# Patient Record
Sex: Female | Born: 1950 | Race: White | Hispanic: No | Smoking: Never smoker
Health system: Southern US, Community
[De-identification: ages and names within clinical notes are randomized; demographics above are authoritative.]

## PROBLEM LIST (undated history)

## (undated) DIAGNOSIS — T7840XA Allergy, unspecified, initial encounter: Secondary | ICD-10-CM

## (undated) DIAGNOSIS — R7303 Prediabetes: Secondary | ICD-10-CM

## (undated) DIAGNOSIS — C4490 Unspecified malignant neoplasm of skin, unspecified: Secondary | ICD-10-CM

## (undated) DIAGNOSIS — L309 Dermatitis, unspecified: Secondary | ICD-10-CM

## (undated) DIAGNOSIS — C50919 Malignant neoplasm of unspecified site of unspecified female breast: Secondary | ICD-10-CM

## (undated) DIAGNOSIS — I10 Essential (primary) hypertension: Secondary | ICD-10-CM

## (undated) DIAGNOSIS — E785 Hyperlipidemia, unspecified: Secondary | ICD-10-CM

## (undated) DIAGNOSIS — E669 Obesity, unspecified: Secondary | ICD-10-CM

## (undated) DIAGNOSIS — M5430 Sciatica, unspecified side: Secondary | ICD-10-CM

## (undated) HISTORY — DX: Unspecified malignant neoplasm of skin, unspecified: C44.90

## (undated) HISTORY — DX: Malignant neoplasm of unspecified site of unspecified female breast: C50.919

## (undated) HISTORY — PX: TUBAL LIGATION: SHX77

## (undated) HISTORY — DX: Dermatitis, unspecified: L30.9

## (undated) HISTORY — DX: Obesity, unspecified: E66.9

## (undated) HISTORY — DX: Sciatica, unspecified side: M54.30

## (undated) HISTORY — PX: CHOLECYSTECTOMY: SHX55

## (undated) HISTORY — DX: Hyperlipidemia, unspecified: E78.5

## (undated) HISTORY — DX: Prediabetes: R73.03

## (undated) HISTORY — DX: Allergy, unspecified, initial encounter: T78.40XA

## (undated) HISTORY — DX: Essential (primary) hypertension: I10

## (undated) HISTORY — PX: TONSILLECTOMY: SUR1361

---

## 1998-01-09 ENCOUNTER — Encounter: Admission: RE | Admit: 1998-01-09 | Discharge: 1998-01-09 | Payer: Self-pay | Admitting: *Deleted

## 2001-10-29 ENCOUNTER — Other Ambulatory Visit: Admission: RE | Admit: 2001-10-29 | Discharge: 2001-10-29 | Payer: Self-pay | Admitting: Obstetrics and Gynecology

## 2002-03-07 ENCOUNTER — Ambulatory Visit (HOSPITAL_COMMUNITY): Admission: RE | Admit: 2002-03-07 | Discharge: 2002-03-07 | Payer: Self-pay | Admitting: Gastroenterology

## 2002-12-13 ENCOUNTER — Encounter: Admission: RE | Admit: 2002-12-13 | Discharge: 2002-12-13 | Payer: Self-pay | Admitting: Obstetrics and Gynecology

## 2002-12-13 ENCOUNTER — Encounter: Payer: Self-pay | Admitting: Obstetrics and Gynecology

## 2003-01-05 ENCOUNTER — Other Ambulatory Visit: Admission: RE | Admit: 2003-01-05 | Discharge: 2003-01-05 | Payer: Self-pay | Admitting: Obstetrics and Gynecology

## 2003-12-18 ENCOUNTER — Encounter: Admission: RE | Admit: 2003-12-18 | Discharge: 2003-12-18 | Payer: Self-pay | Admitting: Obstetrics and Gynecology

## 2004-01-10 ENCOUNTER — Other Ambulatory Visit: Admission: RE | Admit: 2004-01-10 | Discharge: 2004-01-10 | Payer: Self-pay | Admitting: Obstetrics and Gynecology

## 2005-08-29 ENCOUNTER — Encounter: Admission: RE | Admit: 2005-08-29 | Discharge: 2005-08-29 | Payer: Self-pay | Admitting: Obstetrics and Gynecology

## 2006-05-25 ENCOUNTER — Emergency Department (HOSPITAL_COMMUNITY): Admission: EM | Admit: 2006-05-25 | Discharge: 2006-05-25 | Payer: Self-pay | Admitting: Family Medicine

## 2006-10-01 ENCOUNTER — Encounter: Admission: RE | Admit: 2006-10-01 | Discharge: 2006-10-01 | Payer: Self-pay | Admitting: Obstetrics and Gynecology

## 2007-12-14 ENCOUNTER — Encounter: Admission: RE | Admit: 2007-12-14 | Discharge: 2007-12-14 | Payer: Self-pay | Admitting: Obstetrics and Gynecology

## 2008-10-11 ENCOUNTER — Emergency Department (HOSPITAL_COMMUNITY): Admission: EM | Admit: 2008-10-11 | Discharge: 2008-10-11 | Payer: Self-pay | Admitting: Family Medicine

## 2008-12-26 ENCOUNTER — Encounter: Admission: RE | Admit: 2008-12-26 | Discharge: 2008-12-26 | Payer: Self-pay | Admitting: Obstetrics and Gynecology

## 2009-03-23 ENCOUNTER — Other Ambulatory Visit: Admission: RE | Admit: 2009-03-23 | Discharge: 2009-03-23 | Payer: Self-pay | Admitting: Internal Medicine

## 2009-03-23 ENCOUNTER — Ambulatory Visit: Payer: Self-pay | Admitting: Internal Medicine

## 2009-03-28 ENCOUNTER — Encounter: Admission: RE | Admit: 2009-03-28 | Discharge: 2009-03-28 | Payer: Self-pay | Admitting: Internal Medicine

## 2009-06-28 ENCOUNTER — Ambulatory Visit: Payer: Self-pay | Admitting: Internal Medicine

## 2009-07-16 ENCOUNTER — Ambulatory Visit: Payer: Self-pay | Admitting: Internal Medicine

## 2009-09-28 ENCOUNTER — Encounter: Admission: RE | Admit: 2009-09-28 | Discharge: 2009-09-28 | Payer: Self-pay | Admitting: Internal Medicine

## 2009-10-11 ENCOUNTER — Ambulatory Visit: Payer: Self-pay | Admitting: Internal Medicine

## 2009-12-14 ENCOUNTER — Emergency Department (HOSPITAL_COMMUNITY): Admission: EM | Admit: 2009-12-14 | Discharge: 2009-12-14 | Payer: Self-pay | Admitting: Emergency Medicine

## 2009-12-17 ENCOUNTER — Emergency Department (HOSPITAL_COMMUNITY): Admission: EM | Admit: 2009-12-17 | Discharge: 2009-12-17 | Payer: Self-pay | Admitting: Emergency Medicine

## 2010-01-08 ENCOUNTER — Encounter: Admission: RE | Admit: 2010-01-08 | Discharge: 2010-01-08 | Payer: Self-pay | Admitting: Internal Medicine

## 2010-01-17 ENCOUNTER — Ambulatory Visit: Payer: Self-pay | Admitting: Internal Medicine

## 2010-05-27 ENCOUNTER — Ambulatory Visit: Payer: Self-pay | Admitting: Internal Medicine

## 2010-08-19 ENCOUNTER — Ambulatory Visit
Admission: RE | Admit: 2010-08-19 | Discharge: 2010-08-19 | Payer: Self-pay | Source: Home / Self Care | Attending: Internal Medicine | Admitting: Internal Medicine

## 2010-12-27 NOTE — Procedures (Signed)
[facility]  Patient:    AVIGAYIL, TON Visit Number: [id number] MRN: [id number]          Service Type: Attending:  Anselmo Rod, M.D. Dictated by:   Anselmo Rod, M.D. Proc. Date: 03/07/02   CC:         Silverio Lay, M.D.   Procedure Report  DATE OF BIRTH:  July 23, 1951  REFERRING PHYSICIAN:  Silverio Lay, M.D.  PROCEDURE PERFORMED:  Colonoscopy.  ENDOSCOPIST:  Anselmo Rod, M.D.  INSTRUMENT USED:  Olympus video colonoscope.  INDICATIONS FOR PROCEDURE:  The patient is a 60 year old white female with a history of rectal bleeding and family history of breast cancer in her mother. Rule out colonic polyps, masses, etc.  PREPROCEDURE PREPARATION:  Informed consent was procured from the patient. The patient was fasted for eight hours prior to the procedure and prepped with a bottle of magnesium citrate and a gallon of NuLytely the night prior to the procedure.  PREPROCEDURE PHYSICAL:  The patient had stable vital signs.  Neck supple. Chest clear to auscultation.  S1, S2 regular.  Abdomen soft with normal bowel sounds.  DESCRIPTION OF PROCEDURE:  The patient was placed in the left lateral decubitus position and sedated with 50 mg of Demerol and 5 mg of Versed intravenously.  Once the patient was adequately sedated and maintained on low-flow oxygen and continuous cardiac monitoring, the Olympus video colonoscope was advanced from the rectum to the cecum without difficulty. The entire colonic mucosa appeared healthy with normal vascular pattern.  The terminal ileum appeared normal as well.  The appendicular orifice and the ileocecal valve were clearly visualized and photographed.  Small nonbleeding internal hemorrhoids were seen on retroflexion in the rectum.  The patient tolerated the procedure well without complications.  IMPRESSION:  Normal colonoscopy up to the terminal ileum except for small nonbleeding internal  hemorrhoids.  RECOMMENDATIONS: 1. A high fiber diet with liberal fluid intake has been advocated. 2. Outpatient follow-up in the next two weeks or earlier if rectal bleeding    recurs. 3. Repeat colorectal cancer screening in the next five years unless    the patient has any abnormal symptoms in the interim.Dictated by:   Anselmo Rod, M.D. Attending:  Anselmo Rod, M.D. DD:  03/07/02 TD:  03/08/02 Job: [id number]

## 2011-01-21 ENCOUNTER — Other Ambulatory Visit: Payer: 59 | Admitting: Internal Medicine

## 2011-01-21 DIAGNOSIS — E785 Hyperlipidemia, unspecified: Secondary | ICD-10-CM

## 2011-01-21 DIAGNOSIS — E119 Type 2 diabetes mellitus without complications: Secondary | ICD-10-CM

## 2011-01-21 LAB — HEMOGLOBIN A1C
Hgb A1c MFr Bld: 5.9 % — ABNORMAL HIGH (ref <5.7)
Mean Plasma Glucose: 123 mg/dL — ABNORMAL HIGH (ref <117)

## 2011-01-21 LAB — LIPID PANEL: Total CHOL/HDL Ratio: 4 Ratio

## 2011-01-23 ENCOUNTER — Ambulatory Visit (INDEPENDENT_AMBULATORY_CARE_PROVIDER_SITE_OTHER): Payer: 59 | Admitting: Internal Medicine

## 2011-01-23 ENCOUNTER — Encounter: Payer: Self-pay | Admitting: Internal Medicine

## 2011-01-23 DIAGNOSIS — E669 Obesity, unspecified: Secondary | ICD-10-CM

## 2011-01-23 DIAGNOSIS — E559 Vitamin D deficiency, unspecified: Secondary | ICD-10-CM

## 2011-01-23 DIAGNOSIS — E785 Hyperlipidemia, unspecified: Secondary | ICD-10-CM

## 2011-01-23 DIAGNOSIS — R7303 Prediabetes: Secondary | ICD-10-CM

## 2011-01-23 DIAGNOSIS — I10 Essential (primary) hypertension: Secondary | ICD-10-CM

## 2011-01-23 DIAGNOSIS — R7309 Other abnormal glucose: Secondary | ICD-10-CM

## 2011-01-24 ENCOUNTER — Encounter: Payer: Self-pay | Admitting: Internal Medicine

## 2011-01-24 DIAGNOSIS — E785 Hyperlipidemia, unspecified: Secondary | ICD-10-CM | POA: Insufficient documentation

## 2011-01-24 DIAGNOSIS — I10 Essential (primary) hypertension: Secondary | ICD-10-CM | POA: Insufficient documentation

## 2011-01-24 DIAGNOSIS — E559 Vitamin D deficiency, unspecified: Secondary | ICD-10-CM | POA: Insufficient documentation

## 2011-01-24 NOTE — Patient Instructions (Signed)
Restart Crestor. Diet, exercise and lose weight. Recheck lipid panel in 3-6 months with liver functions.

## 2011-01-24 NOTE — Progress Notes (Signed)
  Subjective:    Patient ID: Terri Hawkins, female    DOB: 1951-05-07, 60 y.o.   MRN: [id number]  HPI Unfortunately, pt quit taking Crestor and recent lab work certainly reflects that. The reason for noncompliance is that "she hates to take meds and is annoyed with herself for needing them". Reviewed fasting lipid panel with her which is clearly abnormal and was normal on Crestor 10 mg daily at last visit. She also has hx of Vitamin D deficiency and prediabetes. Hgb AIC is stable on diet alone. Needs to get serious about diet, exercise and weight los. Discussion about this today.    Review of Systems     Objective:   Physical Exam Neck:supple without bruits; Chest clear: Cor:RRR normal S1/S2 Ext: without edema        Assessment & Plan:  Hyperlipidemia- restart Crestor 10 mg daily-    recheck lipid panel and liver functions in 3-6 months.( C/o Zocor causes leg pain.) HTN stable on HCTZ and Norvasc Prediabetes-diet controlled.  AIC was 6% in Jan 2012. Obesity- pt needs to be serious about diet,exercise and weight loss.

## 2011-02-17 ENCOUNTER — Other Ambulatory Visit: Payer: Self-pay | Admitting: Internal Medicine

## 2011-02-17 DIAGNOSIS — Z1231 Encounter for screening mammogram for malignant neoplasm of breast: Secondary | ICD-10-CM

## 2011-02-25 ENCOUNTER — Ambulatory Visit
Admission: RE | Admit: 2011-02-25 | Discharge: 2011-02-25 | Disposition: A | Discharge status: Home / Self Care | Payer: 59 | Source: Ambulatory Visit | Attending: Internal Medicine | Admitting: Internal Medicine

## 2011-02-25 DIAGNOSIS — Z1231 Encounter for screening mammogram for malignant neoplasm of breast: Secondary | ICD-10-CM

## 2011-05-30 ENCOUNTER — Encounter: Payer: 59 | Admitting: Internal Medicine

## 2011-07-10 ENCOUNTER — Other Ambulatory Visit: Payer: Self-pay | Admitting: Internal Medicine

## 2011-07-28 ENCOUNTER — Other Ambulatory Visit: Payer: 59 | Admitting: Internal Medicine

## 2011-07-28 DIAGNOSIS — Z0000 Encounter for general adult medical examination without abnormal findings: Secondary | ICD-10-CM

## 2011-07-28 DIAGNOSIS — Z131 Encounter for screening for diabetes mellitus: Secondary | ICD-10-CM

## 2011-07-28 DIAGNOSIS — I10 Essential (primary) hypertension: Secondary | ICD-10-CM

## 2011-07-28 LAB — COMPREHENSIVE METABOLIC PANEL
ALT: 26 U/L (ref 0–35)
AST: 23 U/L (ref 0–37)
Albumin: 4.5 g/dL (ref 3.5–5.2)
Alkaline Phosphatase: 94 U/L (ref 39–117)
Glucose, Bld: 92 mg/dL (ref 70–99)
Potassium: 4.6 mEq/L (ref 3.5–5.3)
Sodium: 139 mEq/L (ref 135–145)
Total Protein: 7.2 g/dL (ref 6.0–8.3)

## 2011-07-28 LAB — CBC WITH DIFFERENTIAL/PLATELET
Basophils Absolute: 0 10*3/uL (ref 0.0–0.1)
Basophils Relative: 0 % (ref 0–1)
Hemoglobin: 13.6 g/dL (ref 12.0–15.0)
MCHC: 33.3 g/dL (ref 30.0–36.0)
Monocytes Relative: 7 % (ref 3–12)
Neutro Abs: 3.7 10*3/uL (ref 1.7–7.7)
Neutrophils Relative %: 49 % (ref 43–77)

## 2011-07-28 LAB — TSH: TSH: 2.362 u[IU]/mL (ref 0.350–4.500)

## 2011-07-28 LAB — LIPID PANEL: LDL Cholesterol: 117 mg/dL — ABNORMAL HIGH (ref 0–99)

## 2011-07-29 ENCOUNTER — Ambulatory Visit (INDEPENDENT_AMBULATORY_CARE_PROVIDER_SITE_OTHER): Payer: 59 | Admitting: Internal Medicine

## 2011-07-29 ENCOUNTER — Encounter: Payer: Self-pay | Admitting: Internal Medicine

## 2011-07-29 VITALS — BP 148/86 | HR 80 | Temp 98.5°F | Ht 63.5 in | Wt 189.5 lb

## 2011-07-29 DIAGNOSIS — R7302 Impaired glucose tolerance (oral): Secondary | ICD-10-CM

## 2011-07-29 DIAGNOSIS — E785 Hyperlipidemia, unspecified: Secondary | ICD-10-CM

## 2011-07-29 DIAGNOSIS — Z0000 Encounter for general adult medical examination without abnormal findings: Secondary | ICD-10-CM

## 2011-07-29 DIAGNOSIS — I10 Essential (primary) hypertension: Secondary | ICD-10-CM

## 2011-07-29 LAB — VITAMIN D 25 HYDROXY (VIT D DEFICIENCY, FRACTURES): Vit D, 25-Hydroxy: 44 ng/mL (ref 30–89)

## 2011-07-29 LAB — HEMOGLOBIN A1C
Hgb A1c MFr Bld: 6 % — ABNORMAL HIGH (ref <5.7)
Mean Plasma Glucose: 126 mg/dL — ABNORMAL HIGH (ref <117)

## 2011-08-11 ENCOUNTER — Encounter: Payer: Self-pay | Admitting: Internal Medicine

## 2011-08-11 NOTE — Patient Instructions (Addendum)
Continue same medication and return in 6 months. 

## 2011-08-15 ENCOUNTER — Other Ambulatory Visit: Payer: Self-pay | Admitting: Internal Medicine

## 2011-10-13 DIAGNOSIS — R7302 Impaired glucose tolerance (oral): Secondary | ICD-10-CM | POA: Insufficient documentation

## 2011-10-13 NOTE — Progress Notes (Signed)
  Subjective:    Patient ID: Terri Hawkins, female    DOB: 1951-07-04, 61 y.o.   MRN: [id number]  HPI 61 year old white female with hypertension hyperlipidemia vitamin D deficiency and diabetes mellitus in today for health maintenance exam and evaluation of medical problems. Patient says Zocor causes leg pain. She tried Crestor but could not tolerate that either. Currently on Norvasc 5 mg daily for hypertension.  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  History of 2 C-sections 1972 and 1982. Cholecystectomy 1988.  Tetanus immunization at [facility] 2009.  Takes when necessary Valtrex.  Father died at age 13 with history of diabetes the cause of death was brain tumor. Mother died at age 61 breast cancer. 2 brothers in good health. One adult son and adult daughter in good health. No sisters.  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 Has a college education. Is divorced. Does not smoke. Social alcohol consumption. Lives alone. Daughter lives in [place]. Patient likes to raise orchids and chickens.  Had diabetic eye exam 03/12/2011 by Dr. Blima Ledger which was normal.    Review of Systems  Constitutional: Negative.   HENT: Negative.   Eyes: Negative.   Cardiovascular: Negative.   Gastrointestinal: Negative.   Genitourinary: Negative.   Musculoskeletal: Negative.   Neurological: Negative.   Hematological: Negative.   Psychiatric/Behavioral: Negative.        Objective:   Physical Exam  Vitals reviewed. Constitutional: She is oriented to person, place, and time. She appears well-developed and well-nourished. No distress.  HENT:  Head: Normocephalic and atraumatic.  Right Ear: External ear normal.  Left Ear: External ear normal.  Mouth/Throat: Oropharynx is clear and moist.  Eyes: Conjunctivae and EOM are normal. Pupils are equal, round, and reactive to light. Right eye exhibits no discharge. Left eye exhibits no discharge. No scleral icterus.    Neck: Neck supple. No JVD present. No thyromegaly present.  Cardiovascular: Normal rate, regular rhythm, normal heart sounds and intact distal pulses.   No murmur heard. Pulmonary/Chest: Effort normal and breath sounds normal.       Breasts normal female  Abdominal: Soft. Bowel sounds are normal. There is no tenderness. There is no rebound and no guarding.  Genitourinary:       Last Pap smear 03/23/2009. Bimanual exam normal.  Lymphadenopathy:    She has no cervical adenopathy.  Neurological: She is alert and oriented to person, place, and time. She has normal reflexes. She displays normal reflexes. No cranial nerve deficit. Coordination normal.  Skin: Skin is warm and dry. No rash noted. She is not diaphoretic.  Psychiatric: She has a normal mood and affect. Her behavior is normal.          Assessment & Plan:  Hypertension  Hyperlipidemia  Vitamin D deficiency  Diabetes mellitus  Plan: Continue same medication for hypertension. Return in 6-12 months or as needed. Unable to tolerate statin medication. We'll have to control hyperlipidemia with diet.

## 2012-01-27 ENCOUNTER — Other Ambulatory Visit: Payer: 59 | Admitting: Internal Medicine

## 2012-01-27 DIAGNOSIS — E785 Hyperlipidemia, unspecified: Secondary | ICD-10-CM

## 2012-01-27 DIAGNOSIS — Z79899 Other long term (current) drug therapy: Secondary | ICD-10-CM

## 2012-01-27 DIAGNOSIS — R7301 Impaired fasting glucose: Secondary | ICD-10-CM

## 2012-01-27 LAB — HEPATIC FUNCTION PANEL
AST: 21 U/L (ref 0–37)
Alkaline Phosphatase: 85 U/L (ref 39–117)
Bilirubin, Direct: 0.1 mg/dL (ref 0.0–0.3)
Total Bilirubin: 0.7 mg/dL (ref 0.3–1.2)

## 2012-01-27 LAB — LIPID PANEL: LDL Cholesterol: 109 mg/dL — ABNORMAL HIGH (ref 0–99)

## 2012-02-02 ENCOUNTER — Encounter: Payer: Self-pay | Admitting: Internal Medicine

## 2012-02-02 ENCOUNTER — Ambulatory Visit (INDEPENDENT_AMBULATORY_CARE_PROVIDER_SITE_OTHER): Payer: 59 | Admitting: Internal Medicine

## 2012-02-02 VITALS — BP 136/84 | HR 72 | Temp 98.4°F | Ht 63.5 in | Wt 193.0 lb

## 2012-02-02 DIAGNOSIS — E785 Hyperlipidemia, unspecified: Secondary | ICD-10-CM

## 2012-02-02 DIAGNOSIS — I10 Essential (primary) hypertension: Secondary | ICD-10-CM

## 2012-02-02 DIAGNOSIS — Z8639 Personal history of other endocrine, nutritional and metabolic disease: Secondary | ICD-10-CM

## 2012-02-02 DIAGNOSIS — Z87898 Personal history of other specified conditions: Secondary | ICD-10-CM

## 2012-02-02 DIAGNOSIS — R7302 Impaired glucose tolerance (oral): Secondary | ICD-10-CM

## 2012-02-02 DIAGNOSIS — R7309 Other abnormal glucose: Secondary | ICD-10-CM

## 2012-02-08 NOTE — Progress Notes (Signed)
  Subjective:    Patient ID: Terri Hawkins, female    DOB: Jan 21, 1951, 61 y.o.   MRN: [id number]  HPI 61 year old white female in today for sixty-one-month followup on essential hypertension, hyperlipidemia, impaired glucose tolerance and history of vitamin D deficiency. No complaints or problems. Patient says she has had a diabetic eye exam within the past 61 months with Dr. Blima Ledger. She is on amlodipine, Crestor, vitamin D supplement, calcium, Valtrex when necessary  2 C-sections 1979 in 1982. Cholecystectomy 1988. No known drug allergies. Patient stopped taking Zocor in 2010, she concerned about myalgias. Which actually had was radiculopathy. She had an MRI of LS-spine and 2011 showing mild spinal stenosis. Was able to get her to try Crestor instead. This is been fine. Vitamin D level was normal in December 2012. LDL cholesterol was 117 at that time and previously had months previously. TSH was normal.    Review of Systems     Objective:   Physical Exam skin is warm and dry; HEENT exam TMs and pharynx are clear; neck is supple without thyromegaly JVD or carotid bruits; chest clear to auscultation; cardiac exam regular rate and rhythm normal S1 and S2; extremities without edema. Diabetic foot exam is negative. Pulses are normal and no altered in feet.        Assessment & Plan:  Impaired glucose tolerance  History of vitamin D deficiency  Hypertension  Hyperlipidemia  History of herpes simplex infection  Plan: Refilled Valtrex 500 mg twice daily when necessary 1 year refill. Return in 6 months for physical examination. Continue same medications for hyperlipidemia and hypertension.

## 2012-02-08 NOTE — Patient Instructions (Addendum)
Watch diet, exercise, lose some weight. Return in 6 months for physical examination.

## 2012-04-02 ENCOUNTER — Other Ambulatory Visit: Payer: Self-pay | Admitting: Internal Medicine

## 2012-04-02 DIAGNOSIS — Z1231 Encounter for screening mammogram for malignant neoplasm of breast: Secondary | ICD-10-CM

## 2012-04-20 ENCOUNTER — Ambulatory Visit
Admission: RE | Admit: 2012-04-20 | Discharge: 2012-04-20 | Disposition: A | Discharge status: Home / Self Care | Payer: 59 | Source: Ambulatory Visit | Attending: Internal Medicine | Admitting: Internal Medicine

## 2012-04-20 DIAGNOSIS — Z1231 Encounter for screening mammogram for malignant neoplasm of breast: Secondary | ICD-10-CM

## 2012-05-12 ENCOUNTER — Other Ambulatory Visit: Payer: Self-pay

## 2012-05-12 MED ORDER — AMLODIPINE BESYLATE 5 MG PO TABS: 5.0000 mg | ORAL_TABLET | Freq: Every day | ORAL | Status: DC

## 2012-08-02 ENCOUNTER — Other Ambulatory Visit: Payer: 59 | Admitting: Internal Medicine

## 2012-08-03 ENCOUNTER — Encounter: Payer: 59 | Admitting: Internal Medicine

## 2012-08-16 ENCOUNTER — Other Ambulatory Visit: Payer: 59 | Admitting: Internal Medicine

## 2012-08-16 DIAGNOSIS — E785 Hyperlipidemia, unspecified: Secondary | ICD-10-CM

## 2012-08-16 DIAGNOSIS — E559 Vitamin D deficiency, unspecified: Secondary | ICD-10-CM

## 2012-08-16 DIAGNOSIS — R7301 Impaired fasting glucose: Secondary | ICD-10-CM

## 2012-08-16 DIAGNOSIS — I10 Essential (primary) hypertension: Secondary | ICD-10-CM

## 2012-08-16 LAB — COMPREHENSIVE METABOLIC PANEL
ALT: 22 U/L (ref 0–35)
Albumin: 4.8 g/dL (ref 3.5–5.2)
CO2: 26 mEq/L (ref 19–32)
Calcium: 10 mg/dL (ref 8.4–10.5)
Chloride: 105 mEq/L (ref 96–112)
Glucose, Bld: 102 mg/dL — ABNORMAL HIGH (ref 70–99)
Sodium: 141 mEq/L (ref 135–145)
Total Bilirubin: 0.6 mg/dL (ref 0.3–1.2)
Total Protein: 7.5 g/dL (ref 6.0–8.3)

## 2012-08-16 LAB — LIPID PANEL
Cholesterol: 199 mg/dL (ref 0–200)
Triglycerides: 102 mg/dL (ref <150)
VLDL: 20 mg/dL (ref 0–40)

## 2012-08-16 LAB — CBC WITH DIFFERENTIAL/PLATELET
Eosinophils Absolute: 0.1 10*3/uL (ref 0.0–0.7)
Eosinophils Relative: 1 % (ref 0–5)
Hemoglobin: 13.5 g/dL (ref 12.0–15.0)
Lymphocytes Relative: 27 % (ref 12–46)
Lymphs Abs: 2.6 10*3/uL (ref 0.7–4.0)
MCH: 29.3 pg (ref 26.0–34.0)
MCV: 87.2 fL (ref 78.0–100.0)
Monocytes Relative: 5 % (ref 3–12)
Neutrophils Relative %: 67 % (ref 43–77)
RBC: 4.6 MIL/uL (ref 3.87–5.11)
WBC: 9.8 10*3/uL (ref 4.0–10.5)

## 2012-08-16 LAB — HEMOGLOBIN A1C
Hgb A1c MFr Bld: 6 % — ABNORMAL HIGH (ref <5.7)
Mean Plasma Glucose: 126 mg/dL — ABNORMAL HIGH (ref <117)

## 2012-08-17 ENCOUNTER — Other Ambulatory Visit (HOSPITAL_COMMUNITY)
Admission: RE | Admit: 2012-08-17 | Discharge: 2012-08-17 | Disposition: A | Discharge status: Home / Self Care | Payer: 59 | Source: Ambulatory Visit | Attending: Internal Medicine | Admitting: Internal Medicine

## 2012-08-17 ENCOUNTER — Ambulatory Visit (INDEPENDENT_AMBULATORY_CARE_PROVIDER_SITE_OTHER): Payer: 59 | Admitting: Internal Medicine

## 2012-08-17 ENCOUNTER — Encounter: Payer: Self-pay | Admitting: Internal Medicine

## 2012-08-17 VITALS — BP 130/72 | HR 76 | Temp 98.6°F | Ht 63.5 in | Wt 193.0 lb

## 2012-08-17 DIAGNOSIS — I10 Essential (primary) hypertension: Secondary | ICD-10-CM

## 2012-08-17 DIAGNOSIS — Z124 Encounter for screening for malignant neoplasm of cervix: Secondary | ICD-10-CM

## 2012-08-17 DIAGNOSIS — R7302 Impaired glucose tolerance (oral): Secondary | ICD-10-CM

## 2012-08-17 DIAGNOSIS — E785 Hyperlipidemia, unspecified: Secondary | ICD-10-CM

## 2012-08-17 DIAGNOSIS — R7309 Other abnormal glucose: Secondary | ICD-10-CM

## 2012-08-17 DIAGNOSIS — Z01419 Encounter for gynecological examination (general) (routine) without abnormal findings: Secondary | ICD-10-CM | POA: Insufficient documentation

## 2012-08-17 LAB — VITAMIN D 25 HYDROXY (VIT D DEFICIENCY, FRACTURES): Vit D, 25-Hydroxy: 35 ng/mL (ref 30–89)

## 2012-08-17 LAB — POCT URINALYSIS DIPSTICK: Glucose, UA: NEGATIVE

## 2012-08-17 NOTE — Patient Instructions (Addendum)
Continue same medications. You have been given samples of Crestor 10 mg daily because of expense of Crestor on your insurance plan. Return in 6 months for office visit lipid panel liver functions and hemoglobin A1c. Try to diet  & exercise.

## 2012-09-13 ENCOUNTER — Encounter: Payer: Self-pay | Admitting: Internal Medicine

## 2012-09-13 NOTE — Progress Notes (Signed)
  Subjective:    Patient ID: Terri Hawkins, female    DOB: May 18, 1951, 62 y.o.   MRN: [id number]  HPI 62 year old white female in today for health maintenance and evaluation of medical problems. History of essential hypertension, hyperlipidemia, impaired glucose tolerance and history of vitamin D deficiency. Patient tried Zocor but indicated it caused leg pain.   Past medical history: Left knee squamous cell carcinoma July 2010, history of 2 C-sections 1972 and 1982. Cholecystectomy 1988.  Tetanus immunization at [facility] 2009.  Family history: Father died at age 29 with history of diabetes but cause of death was brain tumor. Mother died at age 21 breast cancer. 2 brothers in good health. No sisters  Social history: Patient has one adult son and one adult daughter in good health. 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 Is divorced. Does not smoke. Social alcohol consumption. She resides alone. Daughter lives in [place]. Patient likes to raise orchids and chickens.  Has annual eye exam by Dr. Blima Ledger    Review of Systems  Constitutional: Negative.   HENT: Negative.   Eyes: Negative.   Respiratory: Negative.   Cardiovascular: Negative.   Gastrointestinal: Negative.   Genitourinary: Negative.   Musculoskeletal: Negative.   Neurological: Negative.   Hematological: Negative.   Psychiatric/Behavioral: Negative.        Objective:   Physical Exam  Vitals reviewed. Constitutional: She is oriented to person, place, and time. She appears well-developed and well-nourished. No distress.  HENT:  Head: Normocephalic and atraumatic.  Right Ear: External ear normal.  Left Ear: External ear normal.  Mouth/Throat: Oropharynx is clear and moist.  Eyes: Conjunctivae normal and EOM are normal. Pupils are equal, round, and reactive to light. Right eye exhibits no discharge. Left eye exhibits no discharge. No scleral icterus.  Neck: Neck supple. No JVD  present. No tracheal deviation present. No thyromegaly present.  Cardiovascular: Normal rate, regular rhythm, normal heart sounds and intact distal pulses.  Exam reveals no friction rub.   No murmur heard. Pulmonary/Chest: Effort normal and breath sounds normal. No respiratory distress. She has no wheezes. She has no rales. She exhibits no tenderness.       Breasts normal female  Abdominal: Soft. Bowel sounds are normal. She exhibits no distension and no mass. There is no tenderness. There is no rebound and no guarding.  Genitourinary: Vagina normal and uterus normal.       Pap taken  Musculoskeletal: Normal range of motion. She exhibits no edema.  Lymphadenopathy:    She has no cervical adenopathy.  Neurological: She is alert and oriented to person, place, and time. She has normal reflexes. She displays normal reflexes. No cranial nerve deficit. Coordination normal.  Skin: Skin is warm and dry. No rash noted. She is not diaphoretic.  Psychiatric: She has a normal mood and affect. Her behavior is normal. Judgment and thought content normal.          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

## 2013-02-17 ENCOUNTER — Other Ambulatory Visit: Payer: 59 | Admitting: Internal Medicine

## 2013-02-17 DIAGNOSIS — Z79899 Other long term (current) drug therapy: Secondary | ICD-10-CM

## 2013-02-17 DIAGNOSIS — E785 Hyperlipidemia, unspecified: Secondary | ICD-10-CM

## 2013-02-17 DIAGNOSIS — R7301 Impaired fasting glucose: Secondary | ICD-10-CM

## 2013-02-17 LAB — LIPID PANEL
HDL: 61 mg/dL (ref >39)
LDL Cholesterol: 91 mg/dL (ref 0–99)
Total CHOL/HDL Ratio: 2.9 Ratio

## 2013-02-17 LAB — HEMOGLOBIN A1C: Hgb A1c MFr Bld: 5.7 % — ABNORMAL HIGH (ref <5.7)

## 2013-02-17 LAB — HEPATIC FUNCTION PANEL
AST: 20 U/L (ref 0–37)
Albumin: 4.5 g/dL (ref 3.5–5.2)
Total Bilirubin: 0.5 mg/dL (ref 0.3–1.2)

## 2013-02-18 ENCOUNTER — Ambulatory Visit (INDEPENDENT_AMBULATORY_CARE_PROVIDER_SITE_OTHER): Payer: 59 | Admitting: Internal Medicine

## 2013-02-18 ENCOUNTER — Encounter: Payer: Self-pay | Admitting: Internal Medicine

## 2013-02-18 VITALS — BP 124/72 | HR 72 | Wt 192.0 lb

## 2013-02-18 DIAGNOSIS — R7309 Other abnormal glucose: Secondary | ICD-10-CM

## 2013-02-18 DIAGNOSIS — I10 Essential (primary) hypertension: Secondary | ICD-10-CM

## 2013-02-18 DIAGNOSIS — R7302 Impaired glucose tolerance (oral): Secondary | ICD-10-CM

## 2013-02-18 DIAGNOSIS — E785 Hyperlipidemia, unspecified: Secondary | ICD-10-CM

## 2013-02-18 MED ORDER — CRESTOR 10 MG PO TABS: ORAL_TABLET | ORAL | Status: DC

## 2013-02-18 MED ORDER — AMLODIPINE BESYLATE 5 MG PO TABS: 5.0000 mg | ORAL_TABLET | Freq: Every day | ORAL | Status: DC

## 2013-02-18 NOTE — Patient Instructions (Addendum)
Continue same meds and return in 6 months 

## 2013-02-18 NOTE — Progress Notes (Signed)
  Subjective:    Patient ID: Terri Hawkins, female    DOB: April 10, 1951, 62 y.o.   MRN: [id number]  HPI  62 year old white female with history of hypertension, impaired glucose tolerance, hyperlipidemia treated with Crestor in today for six-month recheck. No complaints or problems. Fasting labs show total cholesterol and LDL cholesterol to be within normal limits. Triglycerides are normal as well. Hemoglobin A1c is 5.7% and previously was 6% January 2014.    Review of Systems noncontributory     Objective:   Physical Exam skin warm and dry. Nodes none. Neck is supple without JVD thyromegaly or carotid bruits. Chest is clear to auscultation. Cardiac exam regular rate and rhythm normal S1 and S2. Extremities without edema. Diabetic foot exam without calluses or ulcers        Assessment & Plan:  Hypertension-stable on current regimen of amlodipine  Hyperlipidemia-stable and within normal limits on Crestor. Liver functions are normal as well  Impaired glucose tolerance-stable on diet alone. Hemoglobin A1c is 5.7% previously was 6% January 2014  Plan: Return in 6 months for physical exam. Samples of Crestor given. Refill amlodipine for one year.  Patient should have annual diabetic eye exam. Sees Dr. Blima Ledger.  Time spent with patient 25 minutes

## 2013-04-05 ENCOUNTER — Other Ambulatory Visit: Payer: Self-pay

## 2013-04-05 DIAGNOSIS — Z1231 Encounter for screening mammogram for malignant neoplasm of breast: Secondary | ICD-10-CM

## 2013-04-22 ENCOUNTER — Ambulatory Visit
Admission: RE | Admit: 2013-04-22 | Discharge: 2013-04-22 | Disposition: A | Discharge status: Home / Self Care | Payer: 59 | Source: Ambulatory Visit

## 2013-04-22 DIAGNOSIS — Z1231 Encounter for screening mammogram for malignant neoplasm of breast: Secondary | ICD-10-CM

## 2013-08-18 ENCOUNTER — Other Ambulatory Visit: Payer: Managed Care, Other (non HMO) | Admitting: Internal Medicine

## 2013-08-18 DIAGNOSIS — Z1329 Encounter for screening for other suspected endocrine disorder: Secondary | ICD-10-CM

## 2013-08-18 DIAGNOSIS — Z130 Encounter for screening for diseases of the blood and blood-forming organs and certain disorders involving the immune mechanism: Secondary | ICD-10-CM

## 2013-08-18 DIAGNOSIS — I10 Essential (primary) hypertension: Secondary | ICD-10-CM

## 2013-08-18 DIAGNOSIS — E785 Hyperlipidemia, unspecified: Secondary | ICD-10-CM

## 2013-08-18 DIAGNOSIS — Z79899 Other long term (current) drug therapy: Secondary | ICD-10-CM

## 2013-08-18 DIAGNOSIS — R7301 Impaired fasting glucose: Secondary | ICD-10-CM

## 2013-08-18 DIAGNOSIS — E559 Vitamin D deficiency, unspecified: Secondary | ICD-10-CM

## 2013-08-18 LAB — COMPREHENSIVE METABOLIC PANEL
ALBUMIN: 4.6 g/dL (ref 3.5–5.2)
ALT: 23 U/L (ref 0–35)
AST: 19 U/L (ref 0–37)
Alkaline Phosphatase: 85 U/L (ref 39–117)
BUN: 17 mg/dL (ref 6–23)
CALCIUM: 9.9 mg/dL (ref 8.4–10.5)
CHLORIDE: 99 meq/L (ref 96–112)
CO2: 30 mEq/L (ref 19–32)
CREATININE: 0.75 mg/dL (ref 0.50–1.10)
GLUCOSE: 89 mg/dL (ref 70–99)
POTASSIUM: 4.5 meq/L (ref 3.5–5.3)
Sodium: 136 mEq/L (ref 135–145)
Total Bilirubin: 0.7 mg/dL (ref 0.3–1.2)
Total Protein: 7.2 g/dL (ref 6.0–8.3)

## 2013-08-18 LAB — CBC WITH DIFFERENTIAL/PLATELET
BASOS PCT: 0 % (ref 0–1)
Basophils Absolute: 0 10*3/uL (ref 0.0–0.1)
EOS PCT: 2 % (ref 0–5)
Eosinophils Absolute: 0.2 10*3/uL (ref 0.0–0.7)
HEMATOCRIT: 41.5 % (ref 36.0–46.0)
HEMOGLOBIN: 13.8 g/dL (ref 12.0–15.0)
LYMPHS PCT: 37 % (ref 12–46)
Lymphs Abs: 3.3 10*3/uL (ref 0.7–4.0)
MCH: 29.3 pg (ref 26.0–34.0)
MCHC: 33.3 g/dL (ref 30.0–36.0)
MCV: 88.1 fL (ref 78.0–100.0)
MONO ABS: 0.7 10*3/uL (ref 0.1–1.0)
MONOS PCT: 8 % (ref 3–12)
NEUTROS ABS: 4.6 10*3/uL (ref 1.7–7.7)
Neutrophils Relative %: 53 % (ref 43–77)
Platelets: 312 10*3/uL (ref 150–400)
RBC: 4.71 MIL/uL (ref 3.87–5.11)
RDW: 13.4 % (ref 11.5–15.5)
WBC: 8.8 10*3/uL (ref 4.0–10.5)

## 2013-08-18 LAB — LIPID PANEL
CHOL/HDL RATIO: 2.6 ratio
CHOLESTEROL: 157 mg/dL (ref 0–200)
HDL: 60 mg/dL (ref >39)
LDL Cholesterol: 79 mg/dL (ref 0–99)
TRIGLYCERIDES: 92 mg/dL (ref <150)
VLDL: 18 mg/dL (ref 0–40)

## 2013-08-18 LAB — HEMOGLOBIN A1C
Hgb A1c MFr Bld: 6.1 % — ABNORMAL HIGH (ref <5.7)
Mean Plasma Glucose: 128 mg/dL — ABNORMAL HIGH (ref <117)

## 2013-08-18 MED ORDER — FLUOCINONIDE-E 0.05 % EX CREA: 1.0000 "application " | TOPICAL_CREAM | Freq: Two times a day (BID) | CUTANEOUS | Status: DC

## 2013-08-18 NOTE — Addendum Note (Signed)
Addended by: Brett Canales on: 08/18/2013 10:36 AM   Modules accepted: Orders

## 2013-08-19 ENCOUNTER — Encounter: Payer: 59 | Admitting: Internal Medicine

## 2013-08-19 LAB — VITAMIN D 25 HYDROXY (VIT D DEFICIENCY, FRACTURES): Vit D, 25-Hydroxy: 51 ng/mL (ref 30–89)

## 2013-08-19 LAB — TSH: TSH: 2.489 u[IU]/mL (ref 0.350–4.500)

## 2014-06-16 ENCOUNTER — Emergency Department (HOSPITAL_COMMUNITY)
Admission: EM | Admit: 2014-06-16 | Discharge: 2014-06-16 | Disposition: A | Discharge status: Home / Self Care | Payer: Managed Care, Other (non HMO) | Attending: Emergency Medicine | Admitting: Emergency Medicine

## 2014-06-16 ENCOUNTER — Encounter (HOSPITAL_COMMUNITY): Payer: Self-pay | Admitting: Emergency Medicine

## 2014-06-16 DIAGNOSIS — T63441A Toxic effect of venom of bees, accidental (unintentional), initial encounter: Secondary | ICD-10-CM

## 2014-06-16 DIAGNOSIS — Y9289 Other specified places as the place of occurrence of the external cause: Secondary | ICD-10-CM | POA: Insufficient documentation

## 2014-06-16 DIAGNOSIS — Y9389 Activity, other specified: Secondary | ICD-10-CM | POA: Diagnosis not present

## 2014-06-16 DIAGNOSIS — E559 Vitamin D deficiency, unspecified: Secondary | ICD-10-CM | POA: Insufficient documentation

## 2014-06-16 DIAGNOSIS — E785 Hyperlipidemia, unspecified: Secondary | ICD-10-CM | POA: Diagnosis not present

## 2014-06-16 DIAGNOSIS — Z79899 Other long term (current) drug therapy: Secondary | ICD-10-CM | POA: Insufficient documentation

## 2014-06-16 DIAGNOSIS — I10 Essential (primary) hypertension: Secondary | ICD-10-CM | POA: Diagnosis not present

## 2014-06-16 MED ORDER — DIPHENHYDRAMINE HCL 25 MG PO TABS: 25.0000 mg | ORAL_TABLET | Freq: Four times a day (QID) | ORAL | Status: DC

## 2014-06-16 MED ORDER — FAMOTIDINE 20 MG PO TABS: 20.0000 mg | ORAL_TABLET | Freq: Two times a day (BID) | ORAL | Status: DC

## 2014-06-16 MED ORDER — PREDNISONE 20 MG PO TABS: ORAL_TABLET | ORAL | Status: DC

## 2014-06-16 MED ORDER — EPINEPHRINE 0.3 MG/0.3ML IJ SOAJ: 0.3000 mg | Freq: Once | INTRAMUSCULAR | Status: DC

## 2014-06-16 NOTE — Discharge Instructions (Signed)

## 2014-06-16 NOTE — ED Provider Notes (Signed)
CSN: [id number]     Arrival date & time 06/16/14  1247 History   First MD Initiated Contact with Patient 06/16/14 1330     Chief Complaint  Patient presents with  . Insect Bite  . Allergic Reaction     (Consider location/radiation/quality/duration/timing/severity/associated sxs/prior Treatment) HPI   63 year old female  who was stung by a yellow jacket at approximately 3 hours ago while she was doing yard work. Immediately after this thing she begins to notice intense itchiness to both of her hands and feet follows with tingling sensation in her tongue. She correlate for symptoms to an allergic reaction to the bee sting and subsequently contacted 911. EMS arrived and patient received 125 mg of Solu-Medrol, 50 mg of Zantac and 50 mg of Benadryl prior to arrival. Since then her symptom has improved and now she felt back to her normal baseline. She has never had allergic reaction to yellow jacket in the past. She denies having any severe headache, tongue swelling, throat swelling, chest pain, shortness of breath, abdominal cramping, nausea vomiting or diarrhea. Patient has no other complaint.  Past Medical History  Diagnosis Date  . Hypertension   . Hyperlipidemia   . Vitamin D deficiency   . Pre-diabetes    Past Surgical History  Procedure Laterality Date  . Cesarean section      x3  . Cholecystectomy     Family History  Problem Relation Age of Onset  . Cancer Mother   . Cancer Father   . Diabetes Father    History  Substance Use Topics  . Smoking status: Never Smoker   . Smokeless tobacco: Never Used  . Alcohol Use: Yes     Comment: socially   OB History    No data available     Review of Systems  All other systems reviewed and are negative.     Allergies  Zocor and Yellow jacket venom  Home Medications   Prior to Admission medications   Medication Sig Start Date End Date Taking? Authorizing Provider  amLODipine (NORVASC) 5 MG tablet Take 1 tablet (5 mg total)  by mouth daily. 02/18/13  Yes Elby Showers, MD  calcium carbonate (OS-CAL) 600 MG TABS Take 600 mg by mouth 2 (two) times daily with a meal.     Yes Historical Provider, MD  Cholecalciferol (VITAMIN D) 2000 UNITS CAPS Take 2,000 Units by mouth every morning.    Yes Historical Provider, MD  Multiple Vitamin (MULTIVITAMIN) tablet Take 1 tablet by mouth every morning.    Yes Historical Provider, MD  rosuvastatin (CRESTOR) 10 MG tablet Take 10 mg by mouth every Monday, Wednesday, and Friday at 8 PM.   Yes Historical Provider, MD  CRESTOR 10 MG tablet TAKE 1 TABLET BY MOUTH DAILY 02/18/13   Elby Showers, MD  fluocinonide-emollient (LIDEX-E) 0.05 % cream Apply 1 application topically 2 (two) times daily. 08/18/13   Elby Showers, MD  valACYclovir (VALTREX) 500 MG tablet Take 500 mg by mouth 2 (two) times daily as needed (fever blister).     Historical Provider, MD   BP 143/68 mmHg  Pulse 90  Temp(Src) 98 F (36.7 C) (Oral)  Resp 18  SpO2 100% Physical Exam  Constitutional: She is oriented to person, place, and time. She appears well-developed and well-nourished. No distress.  HENT:  Head: Atraumatic.  Mouth/Throat: Oropharynx is clear and moist.  Eyes: Conjunctivae are normal.  Neck: Neck supple.  Cardiovascular: Normal rate and regular rhythm.   Pulmonary/Chest:  Effort normal and breath sounds normal. No respiratory distress. She has no wheezes.  Abdominal: Soft. There is no tenderness.  Neurological: She is alert and oriented to person, place, and time.  Skin: No rash noted.  Right posterior calf with an area of skin irritation at the site of bee sting.  No retained stinger.  No induration.    Psychiatric: She has a normal mood and affect.  Nursing note and vitals reviewed.   ED Course  Procedures (including critical care time)  2:08 PM Patient developed allergic reaction to a recent bee sting from a yellow jacket. Symptoms improve drastically after receiving appropriate treatment via  EMS. Patient is resting comfortably at this time. I will continue to monitor but anticipate discharge.  3:12 PM Pt has been monitored for 4 hrs since the initial sting.  She is back to baseline.  Pt felt comfortable to be discharge.  Pt made aware to look for signs of skin infection.  Pt d/c with prednisone/pepcid/benadryl.  epipen provided.  Return precaution discussed.   Labs Review Labs Reviewed - No data to display  Imaging Review No results found.   EKG Interpretation None      MDM   Final diagnoses:  Bee sting reaction, accidental or unintentional, initial encounter    BP 143/68 mmHg  Pulse 90  Temp(Src) 98 F (36.7 C) (Oral)  Resp 18  SpO2 100%     Domenic Moras, PA-C 06/16/14 [place], MD 06/17/14 [phone]

## 2014-06-16 NOTE — ED Notes (Signed)
50mg  zantac, 125mg  solu medrol given by EMS. Pt took 50mg  benadryl at home. Pt stung by yellow jacket at home.

## 2015-05-24 ENCOUNTER — Other Ambulatory Visit: Payer: Self-pay

## 2015-05-24 DIAGNOSIS — Z1231 Encounter for screening mammogram for malignant neoplasm of breast: Secondary | ICD-10-CM

## 2015-06-26 ENCOUNTER — Ambulatory Visit
Admission: RE | Admit: 2015-06-26 | Discharge: 2015-06-26 | Disposition: A | Discharge status: Home / Self Care | Payer: 59 | Source: Ambulatory Visit

## 2015-06-26 DIAGNOSIS — Z1231 Encounter for screening mammogram for malignant neoplasm of breast: Secondary | ICD-10-CM

## 2015-06-28 ENCOUNTER — Other Ambulatory Visit: Payer: Self-pay | Admitting: Family Medicine

## 2015-06-28 DIAGNOSIS — R928 Other abnormal and inconclusive findings on diagnostic imaging of breast: Secondary | ICD-10-CM

## 2015-07-04 ENCOUNTER — Ambulatory Visit
Admission: RE | Admit: 2015-07-04 | Discharge: 2015-07-04 | Disposition: A | Discharge status: Home / Self Care | Payer: 59 | Source: Ambulatory Visit | Attending: Family Medicine | Admitting: Family Medicine

## 2015-07-04 DIAGNOSIS — R928 Other abnormal and inconclusive findings on diagnostic imaging of breast: Secondary | ICD-10-CM

## 2015-10-22 DIAGNOSIS — E785 Hyperlipidemia, unspecified: Secondary | ICD-10-CM | POA: Diagnosis not present

## 2015-10-22 DIAGNOSIS — I10 Essential (primary) hypertension: Secondary | ICD-10-CM | POA: Diagnosis not present

## 2019-04-28 ENCOUNTER — Telehealth: Payer: Self-pay

## 2019-04-28 NOTE — Telephone Encounter (Signed)

## 2019-05-02 ENCOUNTER — Other Ambulatory Visit: Payer: Self-pay

## 2019-05-02 ENCOUNTER — Other Ambulatory Visit: Payer: Self-pay | Admitting: Family Medicine

## 2019-05-02 ENCOUNTER — Ambulatory Visit (INDEPENDENT_AMBULATORY_CARE_PROVIDER_SITE_OTHER): Payer: Medicare Other | Admitting: Family Medicine

## 2019-05-02 ENCOUNTER — Ambulatory Visit: Payer: 59 | Admitting: Family Medicine

## 2019-05-02 VITALS — BP 153/80 | HR 67 | Temp 97.2°F | Resp 17 | Ht 62.0 in | Wt 192.6 lb

## 2019-05-02 DIAGNOSIS — Z1239 Encounter for other screening for malignant neoplasm of breast: Secondary | ICD-10-CM

## 2019-05-02 DIAGNOSIS — R7302 Impaired glucose tolerance (oral): Secondary | ICD-10-CM | POA: Diagnosis not present

## 2019-05-02 DIAGNOSIS — L989 Disorder of the skin and subcutaneous tissue, unspecified: Secondary | ICD-10-CM

## 2019-05-02 DIAGNOSIS — L988 Other specified disorders of the skin and subcutaneous tissue: Secondary | ICD-10-CM | POA: Diagnosis not present

## 2019-05-02 DIAGNOSIS — E785 Hyperlipidemia, unspecified: Secondary | ICD-10-CM | POA: Diagnosis not present

## 2019-05-02 DIAGNOSIS — R82998 Other abnormal findings in urine: Secondary | ICD-10-CM | POA: Diagnosis not present

## 2019-05-02 DIAGNOSIS — E8881 Metabolic syndrome: Secondary | ICD-10-CM

## 2019-05-02 DIAGNOSIS — Z23 Encounter for immunization: Secondary | ICD-10-CM

## 2019-05-02 DIAGNOSIS — Z87898 Personal history of other specified conditions: Secondary | ICD-10-CM

## 2019-05-02 DIAGNOSIS — I10 Essential (primary) hypertension: Secondary | ICD-10-CM

## 2019-05-02 LAB — POCT URINALYSIS DIP (CLINITEK)
Bilirubin, UA: NEGATIVE
Blood, UA: NEGATIVE
Glucose, UA: NEGATIVE mg/dL
Ketones, POC UA: NEGATIVE mg/dL
Nitrite, UA: NEGATIVE
POC PROTEIN,UA: NEGATIVE
Spec Grav, UA: <=1.005 — AB
Urobilinogen, UA: 0.2 U/dL
pH, UA: 5.5

## 2019-05-02 NOTE — Patient Instructions (Signed)
DASH Eating Plan DASH stands for "Dietary Approaches to Stop Hypertension." The DASH eating plan is a healthy eating plan that has been shown to reduce high blood pressure (hypertension). It may also reduce your risk for type 2 diabetes, heart disease, and stroke. The DASH eating plan may also help with weight loss. What are tips for following this plan?  General guidelines  Avoid eating more than 2,300 mg (milligrams) of salt (sodium) a day. If you have hypertension, you may need to reduce your sodium intake to 1,500 mg a day.  Limit alcohol intake to no more than 1 drink a day for nonpregnant women and 2 drinks a day for men. One drink equals 12 oz of beer, 5 oz of wine, or 1 oz of hard liquor.  Work with your health care provider to maintain a healthy body weight or to lose weight. Ask what an ideal weight is for you.  Get at least 30 minutes of exercise that causes your heart to beat faster (aerobic exercise) most days of the week. Activities may include walking, swimming, or biking.  Work with your health care provider or diet and nutrition specialist (dietitian) to adjust your eating plan to your individual calorie needs. Reading food labels   Check food labels for the amount of sodium per serving. Choose foods with less than 5 percent of the Daily Value of sodium. Generally, foods with less than 300 mg of sodium per serving fit into this eating plan.  To find whole grains, look for the word "whole" as the first word in the ingredient list. Shopping  Buy products labeled as "low-sodium" or "no salt added."  Buy fresh foods. Avoid canned foods and premade or frozen meals. Cooking  Avoid adding salt when cooking. Use salt-free seasonings or herbs instead of table salt or sea salt. Check with your health care provider or pharmacist before using salt substitutes.  Do not fry foods. Cook foods using healthy methods such as baking, boiling, grilling, and broiling instead.  Cook with  heart-healthy oils, such as olive, canola, soybean, or sunflower oil. Meal planning  Eat a balanced diet that includes: ? 5 or more servings of fruits and vegetables each day. At each meal, try to fill half of your plate with fruits and vegetables. ? Up to 6-8 servings of whole grains each day. ? Less than 6 oz of lean meat, poultry, or fish each day. A 3-oz serving of meat is about the same size as a deck of cards. One egg equals 1 oz. ? 2 servings of low-fat dairy each day. ? A serving of nuts, seeds, or beans 5 times each week. ? Heart-healthy fats. Healthy fats called Omega-3 fatty acids are found in foods such as flaxseeds and coldwater fish, like sardines, salmon, and mackerel.  Limit how much you eat of the following: ? Canned or prepackaged foods. ? Food that is high in trans fat, such as fried foods. ? Food that is high in saturated fat, such as fatty meat. ? Sweets, desserts, sugary drinks, and other foods with added sugar. ? Full-fat dairy products.  Do not salt foods before eating.  Try to eat at least 2 vegetarian meals each week.  Eat more home-cooked food and less restaurant, buffet, and fast food.  When eating at a restaurant, ask that your food be prepared with less salt or no salt, if possible. What foods are recommended? The items listed may not be a complete list. Talk with your dietitian about   what dietary choices are best for you. Grains Whole-grain or whole-wheat bread. Whole-grain or whole-wheat pasta. Brown rice. Oatmeal. Quinoa. Bulgur. Whole-grain and low-sodium cereals. Pita bread. Low-fat, low-sodium crackers. Whole-wheat flour tortillas. Vegetables Fresh or frozen vegetables (raw, steamed, roasted, or grilled). Low-sodium or reduced-sodium tomato and vegetable juice. Low-sodium or reduced-sodium tomato sauce and tomato paste. Low-sodium or reduced-sodium canned vegetables. Fruits All fresh, dried, or frozen fruit. Canned fruit in natural juice (without  added sugar). Meat and other protein foods Skinless chicken or turkey. Ground chicken or turkey. Pork with fat trimmed off. Fish and seafood. Egg whites. Dried beans, peas, or lentils. Unsalted nuts, nut butters, and seeds. Unsalted canned beans. Lean cuts of beef with fat trimmed off. Low-sodium, lean deli meat. Dairy Low-fat (1%) or fat-free (skim) milk. Fat-free, low-fat, or reduced-fat cheeses. Nonfat, low-sodium ricotta or cottage cheese. Low-fat or nonfat yogurt. Low-fat, low-sodium cheese. Fats and oils Soft margarine without trans fats. Vegetable oil. Low-fat, reduced-fat, or light mayonnaise and salad dressings (reduced-sodium). Canola, safflower, olive, soybean, and sunflower oils. Avocado. Seasoning and other foods Herbs. Spices. Seasoning mixes without salt. Unsalted popcorn and pretzels. Fat-free sweets. What foods are not recommended? The items listed may not be a complete list. Talk with your dietitian about what dietary choices are best for you. Grains Baked goods made with fat, such as croissants, muffins, or some breads. Dry pasta or rice meal packs. Vegetables Creamed or fried vegetables. Vegetables in a cheese sauce. Regular canned vegetables (not low-sodium or reduced-sodium). Regular canned tomato sauce and paste (not low-sodium or reduced-sodium). Regular tomato and vegetable juice (not low-sodium or reduced-sodium). Pickles. Olives. Fruits Canned fruit in a light or heavy syrup. Fried fruit. Fruit in cream or butter sauce. Meat and other protein foods Fatty cuts of meat. Ribs. Fried meat. Bacon. Sausage. Bologna and other processed lunch meats. Salami. Fatback. Hotdogs. Bratwurst. Salted nuts and seeds. Canned beans with added salt. Canned or smoked fish. Whole eggs or egg yolks. Chicken or turkey with skin. Dairy Whole or 2% milk, cream, and half-and-half. Whole or full-fat cream cheese. Whole-fat or sweetened yogurt. Full-fat cheese. Nondairy creamers. Whipped toppings.  Processed cheese and cheese spreads. Fats and oils Butter. Stick margarine. Lard. Shortening. Ghee. Bacon fat. Tropical oils, such as coconut, palm kernel, or palm oil. Seasoning and other foods Salted popcorn and pretzels. Onion salt, garlic salt, seasoned salt, table salt, and sea salt. Worcestershire sauce. Tartar sauce. Barbecue sauce. Teriyaki sauce. Soy sauce, including reduced-sodium. Steak sauce. Canned and packaged gravies. Fish sauce. Oyster sauce. Cocktail sauce. Horseradish that you find on the shelf. Ketchup. Mustard. Meat flavorings and tenderizers. Bouillon cubes. Hot sauce and Tabasco sauce. Premade or packaged marinades. Premade or packaged taco seasonings. Relishes. Regular salad dressings. Where to find more information:  National Heart, Lung, and Blood Institute: www.nhlbi.nih.gov  American Heart Association: www.heart.org Summary  The DASH eating plan is a healthy eating plan that has been shown to reduce high blood pressure (hypertension). It may also reduce your risk for type 2 diabetes, heart disease, and stroke.  With the DASH eating plan, you should limit salt (sodium) intake to 2,300 mg a day. If you have hypertension, you may need to reduce your sodium intake to 1,500 mg a day.  When on the DASH eating plan, aim to eat more fresh fruits and vegetables, whole grains, lean proteins, low-fat dairy, and heart-healthy fats.  Work with your health care provider or diet and nutrition specialist (dietitian) to adjust your eating plan to your   individual calorie needs. This information is not intended to replace advice given to you by your health care provider. Make sure you discuss any questions you have with your health care provider. Document Released: 07/17/2011 Document Revised: 07/10/2017 Document Reviewed: 07/21/2016 Elsevier Patient Education  2020 [place] DASH stands for "Dietary Approaches to Stop Hypertension." The DASH eating plan is a  healthy eating plan that has been shown to reduce high blood pressure (hypertension). It may also reduce your risk for type 2 diabetes, heart disease, and stroke. The DASH eating plan may also help with weight loss. What are tips for following this plan?  General guidelines  Avoid eating more than 2,300 mg (milligrams) of salt (sodium) a day. If you have hypertension, you may need to reduce your sodium intake to 1,500 mg a day.  Limit alcohol intake to no more than 1 drink a day for nonpregnant women and 2 drinks a day for men. One drink equals 12 oz of beer, 5 oz of wine, or 1 oz of hard liquor.  Work with your health care provider to maintain a healthy body weight or to lose weight. Ask what an ideal weight is for you.  Get at least 30 minutes of exercise that causes your heart to beat faster (aerobic exercise) most days of the week. Activities may include walking, swimming, or biking.  Work with your health care provider or diet and nutrition specialist (dietitian) to adjust your eating plan to your individual calorie needs. Reading food labels   Check food labels for the amount of sodium per serving. Choose foods with less than 5 percent of the Daily Value of sodium. Generally, foods with less than 300 mg of sodium per serving fit into this eating plan.  To find whole grains, look for the word "whole" as the first word in the ingredient list. Shopping  Buy products labeled as "low-sodium" or "no salt added."  Buy fresh foods. Avoid canned foods and premade or frozen meals. Cooking  Avoid adding salt when cooking. Use salt-free seasonings or herbs instead of table salt or sea salt. Check with your health care provider or pharmacist before using salt substitutes.  Do not fry foods. Cook foods using healthy methods such as baking, boiling, grilling, and broiling instead.  Cook with heart-healthy oils, such as olive, canola, soybean, or sunflower oil. Meal planning  Eat a balanced  diet that includes: ? 5 or more servings of fruits and vegetables each day. At each meal, try to fill half of your plate with fruits and vegetables. ? Up to 6-8 servings of whole grains each day. ? Less than 6 oz of lean meat, poultry, or fish each day. A 3-oz serving of meat is about the same size as a deck of cards. One egg equals 1 oz. ? 2 servings of low-fat dairy each day. ? A serving of nuts, seeds, or beans 5 times each week. ? Heart-healthy fats. Healthy fats called Omega-3 fatty acids are found in foods such as flaxseeds and coldwater fish, like sardines, salmon, and mackerel.  Limit how much you eat of the following: ? Canned or prepackaged foods. ? Food that is high in trans fat, such as fried foods. ? Food that is high in saturated fat, such as fatty meat. ? Sweets, desserts, sugary drinks, and other foods with added sugar. ? Full-fat dairy products.  Do not salt foods before eating.  Try to eat at least 2 vegetarian meals each week.  Eat more home-cooked food and less restaurant, buffet, and fast food.  When eating at a restaurant, ask that your food be prepared with less salt or no salt, if possible. What foods are recommended? The items listed may not be a complete list. Talk with your dietitian about what dietary choices are best for you. Grains Whole-grain or whole-wheat bread. Whole-grain or whole-wheat pasta. Brown rice. [place]. Bulgur. Whole-grain and low-sodium cereals. Pita bread. Low-fat, low-sodium crackers. Whole-wheat flour tortillas. Vegetables Fresh or frozen vegetables (raw, steamed, roasted, or grilled). Low-sodium or reduced-sodium tomato and vegetable juice. Low-sodium or reduced-sodium tomato sauce and tomato paste. Low-sodium or reduced-sodium canned vegetables. Fruits All fresh, dried, or frozen fruit. Canned fruit in natural juice (without added sugar). Meat and other protein foods Skinless chicken or [place]. Ground chicken or [place]. Pork  with fat trimmed off. Fish and seafood. Egg whites. Dried beans, peas, or lentils. Unsalted nuts, nut butters, and seeds. Unsalted canned beans. Lean cuts of beef with fat trimmed off. Low-sodium, lean deli meat. Dairy Low-fat (1%) or fat-free (skim) milk. Fat-free, low-fat, or reduced-fat cheeses. Nonfat, low-sodium ricotta or cottage cheese. Low-fat or nonfat yogurt. Low-fat, low-sodium cheese. Fats and oils Soft margarine without trans fats. Vegetable oil. Low-fat, reduced-fat, or light mayonnaise and salad dressings (reduced-sodium). Canola, safflower, olive, soybean, and sunflower oils. Avocado. Seasoning and other foods Herbs. Spices. Seasoning mixes without salt. Unsalted popcorn and pretzels. Fat-free sweets. What foods are not recommended? The items listed may not be a complete list. Talk with your dietitian about what dietary choices are best for you. Grains Baked goods made with fat, such as croissants, muffins, or some breads. Dry pasta or rice meal packs. Vegetables Creamed or fried vegetables. Vegetables in a cheese sauce. Regular canned vegetables (not low-sodium or reduced-sodium). Regular canned tomato sauce and paste (not low-sodium or reduced-sodium). Regular tomato and vegetable juice (not low-sodium or reduced-sodium). Angie Fava. Olives. Fruits Canned fruit in a light or heavy syrup. Fried fruit. Fruit in cream or butter sauce. Meat and other protein foods Fatty cuts of meat. Ribs. Fried meat. Berniece Salines. Sausage. Bologna and other processed lunch meats. Salami. Fatback. Hotdogs. Bratwurst. Salted nuts and seeds. Canned beans with added salt. Canned or smoked fish. Whole eggs or egg yolks. Chicken or [place] with skin. Dairy Whole or 2% milk, cream, and half-and-half. Whole or full-fat cream cheese. Whole-fat or sweetened yogurt. Full-fat cheese. Nondairy creamers. Whipped toppings. Processed cheese and cheese spreads. Fats and oils Butter. Stick margarine. Lard. Shortening. Ghee.  Bacon fat. Tropical oils, such as coconut, palm kernel, or palm oil. Seasoning and other foods Salted popcorn and pretzels. Onion salt, garlic salt, seasoned salt, table salt, and sea salt. Worcestershire sauce. Tartar sauce. Barbecue sauce. Teriyaki sauce. Soy sauce, including reduced-sodium. Steak sauce. Canned and packaged gravies. Fish sauce. Oyster sauce. Cocktail sauce. Horseradish that you find on the shelf. Ketchup. Mustard. Meat flavorings and tenderizers. Bouillon cubes. Hot sauce and Tabasco sauce. Premade or packaged marinades. Premade or packaged taco seasonings. Relishes. Regular salad dressings. Where to find more information:  National Heart, Lung, and [place]: https://wilson-eaton.com/  American Heart Association: www.heart.org Summary  The DASH eating plan is a healthy eating plan that has been shown to reduce high blood pressure (hypertension). It may also reduce your risk for type 2 diabetes, heart disease, and stroke.  With the DASH eating plan, you should limit salt (sodium) intake to 2,300 mg a day. If you have hypertension, you may need to reduce your sodium intake  to 1,500 mg a day.  When on the DASH eating plan, aim to eat more fresh fruits and vegetables, whole grains, lean proteins, low-fat dairy, and heart-healthy fats.  Work with your health care provider or diet and nutrition specialist (dietitian) to adjust your eating plan to your individual calorie needs. This information is not intended to replace advice given to you by your health care provider. Make sure you discuss any questions you have with your health care provider. Document Released: 07/17/2011 Document Revised: 07/10/2017 Document Reviewed: 07/21/2016 Elsevier Patient Education  2020 [employer].

## 2019-05-02 NOTE — Progress Notes (Signed)
Will get flu shot today.  Has been off of BP medications for awhile.  Has been watching diet. Would like to keep BP managed with diet. Checks BP at home. Readings have been in the 130s-140s/70s-80s.

## 2019-05-02 NOTE — Progress Notes (Signed)
Patient ID: Terri Hawkins, female   DOB: 02/28/1951, 68 y.o.   MRN: [id number]   Patient"s UA with small leukocytes but results not obtained until after her visit. Will order urine culture if sample is still available so that she can be notified if treatment is needed for UTI.

## 2019-05-02 NOTE — Progress Notes (Signed)
Subjective:  Patient ID: Terri Hawkins, female    DOB: 1950/12/07  Age: 68 y.o. MRN: [id number]  CC: Establish Care, Hypertension, and Hyperlipidemia   HPI Terri Hawkins presents for to establish care and follow-up of chronic medical issues including hypertension and hyperlipidemia.  Patient also reports that in the past she has been told that she had impaired fasting glucose and metabolic syndrome.  She states that her fasting blood sugars have always been normal but her hemoglobin A1c has been elevated in the past.  She reports that she has hypertension but is not currently taking any medication as she wants to see if she can normalize her blood pressure through diet and exercise.  She reports that her home blood pressures are in the 130s to 140s usually, occasionally slightly higher.  She denies headaches or dizziness related to her blood pressure.  She reports that she has made dietary changes over the past 6 weeks and has lost 12 pounds.  She also stays active gardening on a daily basis.  She really does not wish to be on medication.  She has also had high cholesterol in the past and would like to have recheck of her lipids.  She is only had some coffee earlier this morning.  She would also like recheck of her glucose and hemoglobin A1c due to past elevated hemoglobin A1c.  She denies any increased thirst, no urinary frequency and no changes in vision.  She thinks that her blood pressure may also be elevated today as she tends to get anxious when she comes to the doctor's office.  Past Medical History:  Diagnosis Date  . Eczema   . Hyperlipidemia   . Hypertension   . Pre-diabetes   . Vitamin D deficiency     Past Surgical History:  Procedure Laterality Date  . CESAREAN SECTION     x3  . CHOLECYSTECTOMY      Family History  Problem Relation Age of Onset  . Cancer Mother   . Cancer Father   . Diabetes Father     Social History   Tobacco Use  . Smoking status: Never  Smoker  . Smokeless tobacco: Never Used  Substance Use Topics  . Alcohol use: Yes    Comment: socially    ROS Review of Systems  Constitutional: Negative for chills, fatigue and fever.  HENT: Negative for sore throat and trouble swallowing.   Eyes: Negative for photophobia and visual disturbance.  Respiratory: Negative for cough and shortness of breath.   Cardiovascular: Negative for chest pain, palpitations and leg swelling.  Gastrointestinal: Negative for abdominal pain, blood in stool, constipation, diarrhea and nausea.  Endocrine: Negative for cold intolerance, heat intolerance, polydipsia, polyphagia and polyuria.  Genitourinary: Negative for dysuria and frequency.  Musculoskeletal: Negative for arthralgias and back pain.  Neurological: Negative for dizziness and headaches.  Hematological: Negative for adenopathy. Does not bruise/bleed easily.    Objective:   Today's Vitals: BP (!) 153/80   Pulse 67   Temp (!) 97.2 F (36.2 C) (Temporal)   Resp 17   Ht 5\' 2"  (1.575 m)   Wt 192 lb 9.6 oz (87.4 kg)   SpO2 97%   BMI 35.23 kg/m   Physical Exam Vitals signs and nursing note reviewed.  Constitutional:      General: She is not in acute distress.    Appearance: Normal appearance. She is obese. She is not ill-appearing.  Neck:     Musculoskeletal: Normal range of motion  and neck supple. No neck rigidity or muscular tenderness.     Vascular: No carotid bruit.  Cardiovascular:     Rate and Rhythm: Normal rate and regular rhythm.  Pulmonary:     Effort: Pulmonary effort is normal.     Breath sounds: Normal breath sounds.  Abdominal:     Palpations: Abdomen is soft.     Tenderness: There is no abdominal tenderness. There is no right CVA tenderness, left CVA tenderness, guarding or rebound.     Hernia: No hernia is present.  Musculoskeletal:        General: No swelling, tenderness or deformity.     Right lower leg: No edema.     Left lower leg: No edema.   Lymphadenopathy:     Cervical: No cervical adenopathy.  Skin:    General: Skin is warm and dry.     Findings: Lesion (Patient with some areas of skin changes on the mid back and lower legs-moles and freckles.) present.  Neurological:     General: No focal deficit present.     Mental Status: She is alert and oriented to person, place, and time.  Psychiatric:        Mood and Affect: Mood normal.        Thought Content: Thought content normal.     Comments: She appears to be slightly anxious at times especially when talking about her blood pressure     Assessment & Plan:  1. Essential hypertension Patient's blood pressure is elevated at today consistent with hypertension but patient states that her home blood pressures are lower and she believes that her blood pressure today is elevated secondary to anxiety.  She wishes to continue dietary changes in an effort to improve her blood pressure.  She has been asked to return to clinic in approximately 2 weeks and bring her home blood pressure monitor for comparison to make sure that her blood pressures are correctly reflected on home measurements.  She will have lipid panel in follow-up of hypertension and obesity as well as urinalysis to look for any signs of protein which may also indicate the need for medication to better control the blood pressure. - Lipid Panel - POCT URINALYSIS DIP ([facility])  2. History of prediabetes Patient with history of prediabetes with hemoglobin A1c of 6.1 on review of chart but this was done in January 2015.  She reports that she has made dietary changes and has lost 12 pounds in the last 6 weeks.  She will have lipid panel, comprehensive metabolic panel and hemoglobin A1c at today's visit.  She should continue low carbohydrate diet, exercise and efforts at weight loss. - Lipid Panel - Comprehensive metabolic panel  -Need for immunization against influenza She was offered and agreed to have influenza immunization  at today's visit.  Patient also received handout regarding influenza immunization - Flu Vaccine QUAD 6+ mos PF IM (Fluarix Quad PF)  3. Hyperlipidemia, unspecified hyperlipidemia type She reports a history of hyperlipidemia for which she was on statin therapy in the past which caused muscle aches.  Patient is trying to control her lipids and blood pressure through weight loss and dietary changes.  She will have repeat lipid panel and complete metabolic panel at today's visit and will be notified regarding the need for further interventions based on the results - Lipid Panel - Comprehensive metabolic panel  5. Skin lesion Patient is fair skinned and has moles/freckles and has areas on the back and left leg which may  warrant additional attention/biopsy.  She does report prior history of removal of a skin lesion from the left leg which she believes she was told was consistent with squamous cell carcinoma.  Patient has been referred to dermatology for skin check - Ambulatory referral to Dermatology  6. Screening for breast cancer Order given to patient for mammogram at the breast center  7. Metabolic syndrome; 8.  Impaired glucose tolerance Patient reports that she has been told in the past that due to her weight, impaired glucose tolerance and hypertension that she has metabolic syndrome.  She is currently attempting to lose weight through diet and exercise and does not wish to be on medication.  She will have CMP, lipid panel and hemoglobin A1c in follow-up.  Information provided on DASH diet and hypertension as part of after visit summary - Hemoglobin A1c  Outpatient Encounter Medications as of 05/02/2019  Medication Sig  . [DISCONTINUED] amLODipine (NORVASC) 5 MG tablet Take 1 tablet (5 mg total) by mouth daily.  . [DISCONTINUED] calcium carbonate (OS-CAL) 600 MG TABS Take 600 mg by mouth 2 (two) times daily with a meal.    . [DISCONTINUED] Cholecalciferol (VITAMIN D) 2000 UNITS CAPS Take 2,000  Units by mouth every morning.   . [DISCONTINUED] CRESTOR 10 MG tablet TAKE 1 TABLET BY MOUTH DAILY  . [DISCONTINUED] diphenhydrAMINE (BENADRYL) 25 MG tablet Take 1 tablet (25 mg total) by mouth every 6 (six) hours.  . [DISCONTINUED] EPINEPHrine 0.3 mg/0.3 mL IJ SOAJ injection Inject 0.3 mLs (0.3 mg total) into the muscle once.  . [DISCONTINUED] famotidine (PEPCID) 20 MG tablet Take 1 tablet (20 mg total) by mouth 2 (two) times daily.  . [DISCONTINUED] fluocinonide-emollient (LIDEX-E) 0.05 % cream Apply 1 application topically 2 (two) times daily.  . [DISCONTINUED] Multiple Vitamin (MULTIVITAMIN) tablet Take 1 tablet by mouth every morning.   . [DISCONTINUED] predniSONE (DELTASONE) 20 MG tablet 3 tabs po day one, then 2 tabs daily x 4 days  . [DISCONTINUED] rosuvastatin (CRESTOR) 10 MG tablet Take 10 mg by mouth every Monday, Wednesday, and Friday at 8 PM.  . [DISCONTINUED] valACYclovir (VALTREX) 500 MG tablet Take 500 mg by mouth 2 (two) times daily as needed (fever blister).    No facility-administered encounter medications on file as of 05/02/2019.     An After Visit Summary was printed and given to the patient.  Follow-up: Return in about 4 months (around 09/01/2019) for HTN- 2 weeks NV-bring monitor;Marland Kitchen   Pce-Covering Provider

## 2019-05-03 LAB — LIPID PANEL
Chol/HDL Ratio: 3.9 ratio (ref 0.0–4.4)
Cholesterol, Total: 268 mg/dL — ABNORMAL HIGH (ref 100–199)
HDL: 69 mg/dL
LDL Chol Calc (NIH): 185 mg/dL — ABNORMAL HIGH (ref 0–99)
Triglycerides: 83 mg/dL (ref 0–149)
VLDL Cholesterol Cal: 14 mg/dL (ref 5–40)

## 2019-05-03 LAB — COMPREHENSIVE METABOLIC PANEL WITH GFR
ALT: 26 IU/L (ref 0–32)
AST: 21 IU/L (ref 0–40)
Albumin/Globulin Ratio: 1.9 (ref 1.2–2.2)
Albumin: 4.8 g/dL (ref 3.8–4.8)
Alkaline Phosphatase: 91 IU/L (ref 39–117)
BUN/Creatinine Ratio: 20 (ref 12–28)
BUN: 18 mg/dL (ref 8–27)
Bilirubin Total: 0.4 mg/dL (ref 0.0–1.2)
CO2: 22 mmol/L (ref 20–29)
Calcium: 10 mg/dL (ref 8.7–10.3)
Chloride: 100 mmol/L (ref 96–106)
Creatinine, Ser: 0.88 mg/dL (ref 0.57–1.00)
GFR calc Af Amer: 78 mL/min/1.73
GFR calc non Af Amer: 68 mL/min/1.73
Globulin, Total: 2.5 g/dL (ref 1.5–4.5)
Glucose: 86 mg/dL (ref 65–99)
Potassium: 4.3 mmol/L (ref 3.5–5.2)
Sodium: 139 mmol/L (ref 134–144)
Total Protein: 7.3 g/dL (ref 6.0–8.5)

## 2019-05-03 LAB — HEMOGLOBIN A1C
Est. average glucose Bld gHb Est-mCnc: 123 mg/dL
Hgb A1c MFr Bld: 5.9 % — ABNORMAL HIGH (ref 4.8–5.6)

## 2019-05-03 NOTE — Addendum Note (Signed)
Addended by: Carylon Perches on: 05/03/2019 08:12 AM   Modules accepted: Orders

## 2019-05-05 LAB — URINE CULTURE

## 2019-05-06 NOTE — Progress Notes (Signed)
Patient notified of results & recommendations. Expressed understanding. Patient would like to continue working on diet & exercise to manage cholesterol & prediabetes.

## 2019-05-16 ENCOUNTER — Ambulatory Visit (INDEPENDENT_AMBULATORY_CARE_PROVIDER_SITE_OTHER): Payer: Medicare Other

## 2019-05-16 ENCOUNTER — Other Ambulatory Visit: Payer: Self-pay

## 2019-05-16 VITALS — BP 137/75 | HR 75 | Wt 189.0 lb

## 2019-05-16 DIAGNOSIS — R030 Elevated blood-pressure reading, without diagnosis of hypertension: Secondary | ICD-10-CM

## 2019-05-16 DIAGNOSIS — I10 Essential (primary) hypertension: Secondary | ICD-10-CM

## 2019-05-16 DIAGNOSIS — Z0130 Encounter for examination of blood pressure without abnormal findings: Secondary | ICD-10-CM

## 2019-05-16 NOTE — Progress Notes (Signed)
Patient here for BP check. After sitting BP was 137/75, pulse 75. With her machine BP was 128/79, pulse 78. Advised to keep appointment in January.Marland Kitchen KWalker, CMA.

## 2019-08-29 ENCOUNTER — Ambulatory Visit: Payer: 59

## 2019-08-31 ENCOUNTER — Ambulatory Visit (INDEPENDENT_AMBULATORY_CARE_PROVIDER_SITE_OTHER): Payer: Medicare Other | Admitting: Internal Medicine

## 2019-08-31 ENCOUNTER — Encounter: Payer: Self-pay | Admitting: Internal Medicine

## 2019-08-31 VITALS — BP 134/77 | HR 80 | Wt 185.0 lb

## 2019-08-31 DIAGNOSIS — E785 Hyperlipidemia, unspecified: Secondary | ICD-10-CM

## 2019-08-31 DIAGNOSIS — I10 Essential (primary) hypertension: Secondary | ICD-10-CM

## 2019-08-31 NOTE — Progress Notes (Signed)
Trying to get blood pressure down w/o taking medication  BP today was 134/77 HR-80  Has a range of  Home readings   141/77 134/76 138/80 134/79

## 2019-08-31 NOTE — Progress Notes (Signed)
Virtual Visit via Telephone Note  I connected with Terri Hawkins, on 08/31/2019 at 1:37 PM by telephone due to the COVID-19 pandemic and verified that I am speaking with the correct person using two identifiers.   Consent: I discussed the limitations, risks, security and privacy concerns of performing an evaluation and management service by telephone and the availability of in person appointments. I also discussed with the patient that there may be a patient responsible charge related to this service. The patient expressed understanding and agreed to proceed.   Location of Patient: Home   Location of Provider: Clinic    Persons participating in Telemedicine visit: Curlee Kueck [facility] Dr. Juleen China    History of Present Illness: Terri Hawkins is a 69 y.o. female who has visit for follow up of her HTN.Has been trying to do DASH diet; has lost some weight and notices some improvement in her BPs. Currently her weight is 185lbs. She monitors her BPs at home. Today it was 134/77. In early December had an office visit with dermatology where systolic was Q000111Q. Highest BP in the last few weeks was Q000111Q systolic. A few months ago, her systolics at home were in 150s.   Also notes that cholesterol was elevated, has been trying to make diet changes to lower this.   The 10-year ASCVD risk score Mikey Bussing DC Brooke Bonito., et al., 2013) is: 9.1%   Values used to calculate the score:     Age: 89 years     Sex: Female     Is Non-Hispanic African American: No     Diabetic: No     Tobacco smoker: No     Systolic Blood Pressure: 0000000 mmHg     Is BP treated: No     HDL Cholesterol: 69 mg/dL     Total Cholesterol: 268 mg/dL    Past Medical History:  Diagnosis Date  . Eczema   . Hyperlipidemia   . Hypertension   . Obesity   . Pre-diabetes   . Sciatica   . Vitamin D deficiency    Allergies  Allergen Reactions  . Neomycin Rash  . Zocor [Simvastatin]     Leg pain  . Wasp Venom  Protein Itching  . Yellow Jacket Venom [Bee Venom]     No current outpatient medications on file prior to visit.   No current facility-administered medications on file prior to visit.    Observations/Objective: NAD. Speaking clearly.  Work of breathing normal.  Alert and oriented. Mood appropriate.   Assessment and Plan: 1. Essential hypertension, benign BP closer to being well controlled. Patient is making lifestyle changes to lower BP and monitoring closely. Wishes to avoid medications if at all possible.   2. Hyperlipidemia, unspecified hyperlipidemia type Discussed ASCVD risk score of 9.1% and that she is in the group where we could consider statin therapy but it is as not as strong of a recommendation as it would be if she were >10% risk. Patient declines therapy at present. Will continue working on lifestyle modifications.    Follow Up Instructions: In 3 months. Plan for in person visit once received COVID vaccination.    I discussed the assessment and treatment plan with the patient. The patient was provided an opportunity to ask questions and all were answered. The patient agreed with the plan and demonstrated an understanding of the instructions.   The patient was advised to call back or seek an in-person evaluation if the symptoms worsen or if the  condition fails to improve as anticipated.     I provided 12 minutes total of non-face-to-face time during this encounter including median intraservice time, reviewing previous notes, investigations, ordering medications, medical decision making, coordinating care and patient verbalized understanding at the end of the visit.    Phill Myron, D.O. Primary Care at [facility]  08/31/2019, 1:37 PM

## 2020-02-29 ENCOUNTER — Other Ambulatory Visit: Payer: Self-pay

## 2020-02-29 ENCOUNTER — Ambulatory Visit
Admission: EM | Admit: 2020-02-29 | Discharge: 2020-02-29 | Disposition: A | Discharge status: Home / Self Care | Payer: Medicare Other | Attending: Family Medicine | Admitting: Family Medicine

## 2020-02-29 DIAGNOSIS — R300 Dysuria: Secondary | ICD-10-CM | POA: Diagnosis present

## 2020-02-29 DIAGNOSIS — R1030 Lower abdominal pain, unspecified: Secondary | ICD-10-CM | POA: Diagnosis not present

## 2020-02-29 LAB — POCT URINALYSIS DIP (MANUAL ENTRY)
Bilirubin, UA: NEGATIVE
Blood, UA: NEGATIVE
Glucose, UA: NEGATIVE mg/dL
Ketones, POC UA: NEGATIVE mg/dL
Leukocytes, UA: NEGATIVE
Nitrite, UA: NEGATIVE
Protein Ur, POC: NEGATIVE mg/dL
Spec Grav, UA: 1.015 (ref 1.010–1.025)
Urobilinogen, UA: 0.2 U/dL
pH, UA: 5 (ref 5.0–8.0)

## 2020-02-29 NOTE — ED Triage Notes (Signed)
Pt c/o bladder pressure since yesterday, states worse after urinating. Denies urinary frequency, urgency, or burning.

## 2020-02-29 NOTE — Discharge Instructions (Addendum)
You may have a urinary tract infection.   We are going to culture your urine and will call you if the results show an infection and you need further treatment   Drink plenty of water, 8-10 glasses per day.   You may take AZO over the counter for painful urination.  Follow up with your primary care provider as needed.   Go to the Emergency Department if you experience severe pain, shortness of breath, high fever, or other concerns.

## 2020-02-29 NOTE — ED Provider Notes (Signed)
[facility]   CC: UTI  SUBJECTIVE:  Terri Hawkins is a 69 y.o. female who complains of urinary frequency, urgency and dysuria for the past 2-3 days.  Patient denies a precipitating event, recent sexual encounter, excessive caffeine intake.  Localizes the pain to the lower abdomen.  Pain is intermittent and describes it as sharpg.  Has not tried OTC.  Symptoms are made worse with urination.  Admits to similar symptoms in the past.  Denies fever, chills, nausea, vomiting, flank pain, abnormal vaginal discharge or bleeding, hematuria.    LMP: No LMP recorded. Patient is postmenopausal.  ROS: As in HPI.  All other pertinent ROS negative.     Past Medical History:  Diagnosis Date  . Eczema   . Hyperlipidemia   . Hypertension   . Obesity   . Pre-diabetes   . Sciatica   . Vitamin D deficiency    Past Surgical History:  Procedure Laterality Date  . CESAREAN SECTION     x3  . CHOLECYSTECTOMY     Allergies  Allergen Reactions  . Neomycin Rash  . Zocor [Simvastatin]     Leg pain  . Wasp Venom Protein Itching  . Yellow Jacket Venom [Bee Venom]    No current facility-administered medications on file prior to encounter.   No current outpatient medications on file prior to encounter.   Social History   Socioeconomic History  . Marital status: Divorced    Spouse name: Not on file  . Number of children: 2  . Years of education: 12+  . Highest education level: Not on file  Occupational History  . Not on file  Tobacco Use  . Smoking status: Never Smoker  . Smokeless tobacco: Never Used  Substance and Sexual Activity  . Alcohol use: Yes    Comment: socially  . Drug use: No  . Sexual activity: Not on file  Other Topics Concern  . Not on file  Social History Narrative  . Not on file   Social Determinants of Health   Financial Resource Strain:   . Difficulty of Paying Living Expenses:   Food Insecurity:   . Worried About Charity fundraiser in the Last  Year:   . Arboriculturist in the Last Year:   Transportation Needs:   . Film/video editor (Medical):   Marland Kitchen Lack of Transportation (Non-Medical):   Physical Activity:   . Days of Exercise per Week:   . Minutes of Exercise per Session:   Stress:   . Feeling of Stress :   Social Connections:   . Frequency of Communication with Friends and Family:   . Frequency of Social Gatherings with Friends and Family:   . Attends Religious Services:   . Active Member of Clubs or Organizations:   . Attends Archivist Meetings:   Marland Kitchen Marital Status:   Intimate Partner Violence:   . Fear of Current or Ex-Partner:   . Emotionally Abused:   Marland Kitchen Physically Abused:   . Sexually Abused:    Family History  Problem Relation Age of Onset  . Breast cancer Mother   . Diabetes Father   . Brain cancer Father     OBJECTIVE:  Vitals:   02/29/20 0940  BP: (!) 166/90  Pulse: 84  Resp: 18  Temp: 97.8 F (36.6 C)  TempSrc: Oral  SpO2: 96%   General appearance: AOx3 in no acute distress HEENT: NCAT.  Oropharynx clear.  Lungs: clear to auscultation bilaterally  without adventitious breath sounds Heart: regular rate and rhythm.  Radial pulses 2+ symmetrical bilaterally Abdomen: soft; non-distended;  suprapubic tenderness; bowel sounds present; no guarding or rebound tenderness Back: no CVA tenderness Extremities: no edema; symmetrical with no gross deformities Skin: warm and dry Neurologic: Ambulates from chair to exam table without difficulty Psychological: alert and cooperative; normal mood and affect  Labs Reviewed  POCT URINALYSIS DIP (MANUAL ENTRY) - Normal  URINE CULTURE    ASSESSMENT & PLAN:  1. Dysuria   2. Lower abdominal pain     No orders of the defined types were placed in this encounter.  Urine culture sent.   We will call you with abnormal results that need further treatment.   Push fluids and get plenty of rest.   Take pyridium as prescribed and as needed for  symptomatic relief Follow up with PCP if symptoms persists Return here or go to ER if you have any new or worsening symptoms such as fever, worsening abdominal pain, nausea/vomiting, flank pain  Outlined signs and symptoms indicating need for more acute intervention. Patient verbalized understanding. After Visit Summary given.     Faustino Congress, NP 02/29/20 1004

## 2020-03-01 LAB — URINE CULTURE: Culture: <10000 — AB

## 2021-01-14 ENCOUNTER — Other Ambulatory Visit: Payer: Self-pay

## 2021-01-14 ENCOUNTER — Ambulatory Visit
Admission: EM | Admit: 2021-01-14 | Discharge: 2021-01-14 | Disposition: A | Discharge status: Home / Self Care | Payer: Medicare Other | Attending: Emergency Medicine | Admitting: Emergency Medicine

## 2021-01-14 ENCOUNTER — Other Ambulatory Visit: Payer: Self-pay | Admitting: Family

## 2021-01-14 DIAGNOSIS — I10 Essential (primary) hypertension: Secondary | ICD-10-CM

## 2021-01-14 DIAGNOSIS — Z1231 Encounter for screening mammogram for malignant neoplasm of breast: Secondary | ICD-10-CM

## 2021-01-14 MED ORDER — AMLODIPINE BESYLATE 5 MG PO TABS
5.0000 mg | ORAL_TABLET | Freq: Every day | ORAL | 0 refills | Status: DC
Start: 2021-01-14 — End: 2021-02-22

## 2021-01-14 NOTE — ED Triage Notes (Signed)
Pt reports that she checked her BP at home and the reading was around 175/90. Denies chest pain,, sob and other sxs. Pt was concerned due to elevate reading. Pt took Norvasc several years ago, but she has discontinued use.

## 2021-01-14 NOTE — ED Provider Notes (Signed)
[facility]    CSN: [id number] Arrival date & time: 01/14/21  1859      History   Chief Complaint Chief Complaint  Patient presents with  . Hypertension    HPI Terri Hawkins is a 70 y.o. female presenting today for evaluation of elevated blood pressure.  Reports that she was checking her blood pressure today and notes that it was around 175/90.  Reports upper arm cuff as well as wrist cuff reading similarly.  Denies any symptoms prompting her checking her blood pressure.  Denies any headaches, vision changes, dizziness, lightheadedness, chest pain or shortness of breath.  Denies any leg pain or leg swelling.  Reports previously was on Norvasc but stopped taking this.  Was attempting lifestyle modifications, but admits to not adhering to this of recently.  Has plans to follow-up with PCP in approximately 1 week.  Would like to restart blood pressure medicine.  HPI  Past Medical History:  Diagnosis Date  . Eczema   . Hyperlipidemia   . Hypertension   . Obesity   . Pre-diabetes   . Sciatica   . Vitamin D deficiency     Patient Active Problem List   Diagnosis Date Noted  . Impaired glucose tolerance 10/13/2011  . Essential hypertension, benign 01/24/2011  . Hyperlipidemia 01/24/2011  . Unspecified vitamin D deficiency 01/24/2011    Past Surgical History:  Procedure Laterality Date  . CESAREAN SECTION     x3  . CHOLECYSTECTOMY      OB History   No obstetric history on file.      Home Medications    Prior to Admission medications   Medication Sig Start Date End Date Taking? Authorizing Provider  amLODipine (NORVASC) 5 MG tablet Take 1 tablet (5 mg total) by mouth daily. 01/14/21  Yes Corliss Lamartina, Elesa Hacker, PA-C    Family History Family History  Problem Relation Age of Onset  . Breast cancer Mother   . Diabetes Father   . Brain cancer Father     Social History Social History   Tobacco Use  . Smoking status: Never Smoker  . Smokeless tobacco:  Never Used  Substance Use Topics  . Alcohol use: Yes    Comment: socially  . Drug use: No     Allergies   Neomycin, Zocor [simvastatin], Wasp venom protein, and Yellow jacket venom [bee venom]   Review of Systems Review of Systems  Constitutional: Negative for fatigue and fever.  HENT: Negative for congestion, sinus pressure and sore throat.   Eyes: Negative for photophobia, pain and visual disturbance.  Respiratory: Negative for cough and shortness of breath.   Cardiovascular: Negative for chest pain.  Gastrointestinal: Negative for abdominal pain, nausea and vomiting.  Genitourinary: Negative for decreased urine volume and hematuria.  Musculoskeletal: Negative for myalgias, neck pain and neck stiffness.  Neurological: Negative for dizziness, syncope, facial asymmetry, speech difficulty, weakness, light-headedness, numbness and headaches.     Physical Exam Triage Vital Signs ED Triage Vitals  Enc Vitals Group     BP 01/14/21 1947 (!) 152/84     Pulse Rate 01/14/21 1941 95     Resp 01/14/21 1941 18     Temp 01/14/21 1941 98.6 F (37 C)     Temp Source 01/14/21 1941 Oral     SpO2 01/14/21 1941 94 %     Weight --      Height --      Head Circumference --      Peak Flow --  Pain Score 01/14/21 1943 0     Pain Loc --      Pain Edu? --      Excl. in [place]? --    No data found.  Updated Vital Signs BP (!) 152/84   Pulse 95   Temp 98.6 F (37 C) (Oral)   Resp 18   SpO2 94%   Visual Acuity Right Eye Distance:   Left Eye Distance:   Bilateral Distance:    Right Eye Near:   Left Eye Near:    Bilateral Near:     Physical Exam Vitals and nursing note reviewed.  Constitutional:      Appearance: She is well-developed.     Comments: No acute distress  HENT:     Head: Normocephalic and atraumatic.     Nose: Nose normal.  Eyes:     Conjunctiva/sclera: Conjunctivae normal.  Cardiovascular:     Rate and Rhythm: Normal rate and regular rhythm.  Pulmonary:      Effort: Pulmonary effort is normal. No respiratory distress.     Comments: Breathing comfortably at rest, CTABL, no wheezing, rales or other adventitious sounds auscultated Abdominal:     General: There is no distension.  Musculoskeletal:        General: Normal range of motion.     Cervical back: Neck supple.     Comments: No leg swelling  Skin:    General: Skin is warm and dry.  Neurological:     Mental Status: She is alert and oriented to person, place, and time.      UC Treatments / Results  Labs (all labs ordered are listed, but only abnormal results are displayed) Labs Reviewed - No data to display  EKG   Radiology No results found.  Procedures Procedures (including critical care time)  Medications Ordered in UC Medications - No data to display  Initial Impression / Assessment and Plan / UC Course  I have reviewed the triage vital signs and the nursing notes.  Pertinent labs & imaging results that were available during my care of the patient were reviewed by me and considered in my medical decision making (see chart for details).  Clinical Course as of 01/14/21 2114  Mon Jan 14, 2021  2006 BP rechecked- 154/84 [HW]    Clinical Course User Index [HW] Raeshaun Simson, Elesa Hacker, [place]    Essential hypertension-blood pressure readings in clinic improved from reported readings at home, but through shared decision-making with patient will go ahead and initiate on amlodipine 5 mg daily and follow-up with PCP in approximately 1 week as planned.  Currently asymptomatic.  Discussed strict return precautions. Patient verbalized understanding and is agreeable with plan.  Final Clinical Impressions(s) / UC Diagnoses   Final diagnoses:  Essential hypertension     Discharge Instructions     Begin amlodipine daily Continue to record pressure Follow up with primary care next week as planned    ED Prescriptions    Medication Sig Dispense Auth. Provider   amLODipine  (NORVASC) 5 MG tablet Take 1 tablet (5 mg total) by mouth daily. 30 tablet Willliam Pettet, [place] C, PA-C     PDMP not reviewed this encounter.   Janith Lima, [place] 01/14/21 2114

## 2021-01-14 NOTE — Discharge Instructions (Signed)
Begin amlodipine daily Continue to record pressure Follow up with primary care next week as planned

## 2021-01-16 ENCOUNTER — Ambulatory Visit: Payer: Medicare Other | Admitting: Family

## 2021-01-16 ENCOUNTER — Ambulatory Visit
Admission: RE | Admit: 2021-01-16 | Discharge: 2021-01-16 | Disposition: A | Discharge status: Home / Self Care | Payer: Medicare Other | Source: Ambulatory Visit

## 2021-01-16 ENCOUNTER — Other Ambulatory Visit: Payer: Self-pay

## 2021-01-16 DIAGNOSIS — Z1231 Encounter for screening mammogram for malignant neoplasm of breast: Secondary | ICD-10-CM

## 2021-01-18 ENCOUNTER — Other Ambulatory Visit: Payer: Self-pay | Admitting: Family

## 2021-01-18 DIAGNOSIS — R928 Other abnormal and inconclusive findings on diagnostic imaging of breast: Secondary | ICD-10-CM

## 2021-01-21 NOTE — Progress Notes (Deleted)
  Subjective:    Terri Hawkins - 70 y.o. female MRN [id number]  Date of birth: [birth year]  HPI  Terri Hawkins is to establish care. Patient has a PMH significant for essential hypertension, impaired glucose tolerance, hyperlipidemia, and unspecified vitamin D deficiency.   Current issues and/or concerns:  ROS per HPI     Health Maintenance:  - *** Health Maintenance Due  Topic Date Due   COVID-19 Vaccine (1) Never done   Hepatitis C Screening  Never done   Zoster Vaccines- Shingrix (1 of 2) Never done   PNA vac Low Risk Adult (1 of 2 - PCV13) Never done   TETANUS/TDAP  08/12/2017     Past Medical History: Patient Active Problem List   Diagnosis Date Noted   Impaired glucose tolerance 10/13/2011   Essential hypertension, benign 01/24/2011   Hyperlipidemia 01/24/2011   Unspecified vitamin D deficiency 01/24/2011      Social History   reports that she has never smoked. She has never used smokeless tobacco. She reports current alcohol use. She reports that she does not use drugs.   Family History  family history includes Brain cancer in her father; Breast cancer in her mother; Diabetes in her father.   Medications: reviewed and updated   Objective:   Physical Exam There were no vitals taken for this visit. Physical Exam      Assessment & Plan:         Patient was given clear instructions to go to Emergency Department or return to medical center if symptoms don't improve, worsen, or new problems develop.The patient verbalized understanding.  I discussed the assessment and treatment plan with the patient. The patient was provided an opportunity to ask questions and all were answered. The patient agreed with the plan and demonstrated an understanding of the instructions.   The patient was advised to call back or seek an in-person evaluation if the symptoms worsen or if the condition fails to improve as anticipated.    Durene Fruits, NP 01/21/2021,  8:40 AM Primary Care at [facility]

## 2021-01-22 ENCOUNTER — Ambulatory Visit: Payer: Medicare Other | Admitting: Family

## 2021-02-08 ENCOUNTER — Other Ambulatory Visit: Payer: Self-pay | Admitting: Family

## 2021-02-08 ENCOUNTER — Ambulatory Visit
Admission: RE | Admit: 2021-02-08 | Discharge: 2021-02-08 | Disposition: A | Discharge status: Home / Self Care | Payer: Medicare Other | Source: Ambulatory Visit | Attending: Family | Admitting: Family

## 2021-02-08 ENCOUNTER — Other Ambulatory Visit: Payer: Self-pay

## 2021-02-08 DIAGNOSIS — N6320 Unspecified lump in the left breast, unspecified quadrant: Secondary | ICD-10-CM

## 2021-02-08 DIAGNOSIS — C50919 Malignant neoplasm of unspecified site of unspecified female breast: Secondary | ICD-10-CM

## 2021-02-08 DIAGNOSIS — R928 Other abnormal and inconclusive findings on diagnostic imaging of breast: Secondary | ICD-10-CM

## 2021-02-08 HISTORY — DX: Malignant neoplasm of unspecified site of unspecified female breast: C50.919

## 2021-02-08 HISTORY — PX: BREAST BIOPSY: SHX20

## 2021-02-09 NOTE — Progress Notes (Signed)
Keep appointment scheduled 02/19/2021 at [facility] [facility] for ultrasound-guided biopsy.

## 2021-02-19 ENCOUNTER — Other Ambulatory Visit: Payer: Self-pay

## 2021-02-19 ENCOUNTER — Ambulatory Visit
Admission: RE | Admit: 2021-02-19 | Discharge: 2021-02-19 | Disposition: A | Discharge status: Home / Self Care | Payer: Medicare Other | Source: Ambulatory Visit | Attending: Family | Admitting: Family

## 2021-02-19 DIAGNOSIS — N6320 Unspecified lump in the left breast, unspecified quadrant: Secondary | ICD-10-CM

## 2021-02-21 ENCOUNTER — Telehealth: Payer: Self-pay | Admitting: Hematology

## 2021-02-21 NOTE — Telephone Encounter (Signed)
Spoke with patient to confirm morning clinic for 7/20, will send packet via email

## 2021-02-21 NOTE — Progress Notes (Signed)
Patient ID: Terri Hawkins, female    DOB: 08-Oct-1950  MRN: [id number]  CC: Hypertension Follow-Up  Subjective: Terri Hawkins is a 70 y.o. female who presents for hypertension follow-up.   Her concerns today include:  HYPERTENSION FOLLOW-UP: 08/31/2019 per DO note: BP closer to being well controlled. Patient is making lifestyle changes to lower BP and monitoring closely. Wishes to avoid medications if at all possible.   Visit 01/14/2021 at [facility] per PA note: Essential hypertension-blood pressure readings in clinic improved from reported readings at home, but through shared decision-making with patient will go ahead and initiate on amlodipine 5 mg daily and follow-up with PCP in approximately 1 week as planned.  Currently asymptomatic.  02/22/2021: Currently taking: see medication list Med Adherence: Yes, but has been 8 days without medication because she ran out. Medication side effects: []  Yes    [x]  No Adherence with salt restriction (low-salt diet): [x]  Yes    []  No Exercise: Yes [x]  No []  Home Monitoring?: [x]  Yes    []  No  Monitoring Frequency: []  Yes    [x]  No Smoking []  Yes [x]  No SOB? []  Yes    [x]  No Chest Pain?: []  Yes    [x]  No Comments: Recently diagnosed with breast cancer. Concern if blood pressure medication will be ok with any breast cancer treatments she may have. First appointment with Oncology on next week.   Patient Active Problem List   Diagnosis Date Noted   Malignant neoplasm of upper-outer quadrant of left breast in female, estrogen receptor positive ([place]) 02/22/2021   Essential hypertension, benign 01/24/2011   Hyperlipidemia 01/24/2011   Unspecified vitamin D deficiency 01/24/2011     Current Outpatient Medications on File Prior to Visit  Medication Sig Dispense Refill   Cholecalciferol 25 MCG (1000 UT) CHEW Vitamin D3 25 mcg (1,000 unit) chewable tablet  Take 1 tablet every day by oral route.     No current  facility-administered medications on file prior to visit.    Allergies  Allergen Reactions   Zocor [Simvastatin]     Leg pain   Wasp Venom Protein Itching   Yellow Jacket Venom [Bee Venom]     Social History   Socioeconomic History   Marital status: Divorced    Spouse name: Not on file   Number of children: 2   Years of education: 12+   Highest education level: Not on file  Occupational History   Not on file  Tobacco Use   Smoking status: Never   Smokeless tobacco: Never  Substance and Sexual Activity   Alcohol use: Yes    Comment: socially   Drug use: No   Sexual activity: Not on file  Other Topics Concern   Not on file  Social History Narrative   Not on file   Social Determinants of Health   Financial Resource Strain: Not on file  Food Insecurity: Not on file  Transportation Needs: Not on file  Physical Activity: Not on file  Stress: Not on file  Social Connections: Not on file  Intimate Partner Violence: Not on file    Family History  Problem Relation Age of Onset   Breast cancer Mother    Diabetes Father    Brain cancer Father     Past Surgical History:  Procedure Laterality Date   CESAREAN SECTION     x3   CHOLECYSTECTOMY      ROS: Review of Systems Negative except as stated  above  PHYSICAL EXAM: BP 133/76 (BP Location: Right Arm, Patient Position: Sitting, Cuff Size: Large)   Pulse 85   Temp 97.9 F (36.6 C)   Resp 18   Ht 5' 4.21" (1.631 m)   Wt 187 lb 9.6 oz (85.1 kg)   SpO2 97%   BMI 31.99 kg/m    Physical Exam HENT:     Head: Normocephalic and atraumatic.  Eyes:     Extraocular Movements: Extraocular movements intact.     Conjunctiva/sclera: Conjunctivae normal.     Pupils: Pupils are equal, round, and reactive to light.  Cardiovascular:     Rate and Rhythm: Normal rate and regular rhythm.  Pulmonary:     Effort: Pulmonary effort is normal.     Breath sounds: Normal breath sounds.  Musculoskeletal:     Cervical back:  Normal range of motion and neck supple.  Neurological:     General: No focal deficit present.     Mental Status: She is alert and oriented to person, place, and time.  Psychiatric:        Mood and Affect: Mood normal.        Behavior: Behavior normal.   ASSESSMENT AND PLAN: 1. Essential hypertension: - Continue Amlodipine as prescribed.  - Counseled on blood pressure goal of less than 140/90, low-sodium, DASH diet, medication compliance, 150 minutes of moderate intensity exercise per week as tolerated. Discussed medication compliance, adverse effects. - BMP to evaluate kidney function and electrolyte balance. - Patient recently diagnosed with breast cancer. Her first appointment with Oncology is on next week. Patient concerned if Amlodipine is compatible with possible treatments she may receive from Oncology. Counseled patient to confirm with Oncology the exact treatment plan she will receive and recommendations on discontinuing Amlodipine or possibly changing to another blood pressure medication. At that time patient asked to follow-up with me for recommended changes to regimen. If no changes are recommended to the regimen patient should follow-up with me in 3 months or sooner if needed.  - Basic Metabolic Panel - amLODipine (NORVASC) 5 MG tablet; Take 1 tablet (5 mg total) by mouth daily.  Dispense: 90 tablet; Refill: 0    Patient was given the opportunity to ask questions.  Patient verbalized understanding of the plan and was able to repeat key elements of the plan. Patient was given clear instructions to go to Emergency Department or return to medical center if symptoms don't improve, worsen, or new problems develop.The patient verbalized understanding.   Orders Placed This Encounter  Procedures   Basic Metabolic Panel    Requested Prescriptions   Signed Prescriptions Disp Refills   amLODipine (NORVASC) 5 MG tablet 90 tablet 0    Sig: Take 1 tablet (5 mg total) by mouth daily.     Return in about 3 months (around 05/25/2021) for Follow-Up hypertension .  [name], NP

## 2021-02-22 ENCOUNTER — Other Ambulatory Visit: Payer: Self-pay

## 2021-02-22 ENCOUNTER — Encounter: Payer: Self-pay | Admitting: Family

## 2021-02-22 ENCOUNTER — Ambulatory Visit (INDEPENDENT_AMBULATORY_CARE_PROVIDER_SITE_OTHER): Payer: Medicare Other | Admitting: Family

## 2021-02-22 ENCOUNTER — Encounter: Payer: Self-pay | Admitting: *Deleted

## 2021-02-22 VITALS — BP 133/76 | HR 85 | Temp 97.9°F | Resp 18 | Ht 64.21 in | Wt 187.6 lb

## 2021-02-22 DIAGNOSIS — C50412 Malignant neoplasm of upper-outer quadrant of left female breast: Secondary | ICD-10-CM

## 2021-02-22 DIAGNOSIS — I10 Essential (primary) hypertension: Secondary | ICD-10-CM | POA: Diagnosis not present

## 2021-02-22 DIAGNOSIS — Z170 Estrogen receptor positive status [ER+]: Secondary | ICD-10-CM | POA: Insufficient documentation

## 2021-02-22 MED ORDER — AMLODIPINE BESYLATE 5 MG PO TABS: 5.0000 mg | ORAL_TABLET | Freq: Every day | ORAL | 0 refills | Status: DC

## 2021-02-22 NOTE — Progress Notes (Signed)
Pt presents for medication refill of amlodipine. Pt is concerned about the breast biopsy she completed on 7/12 seemed to be cancer and she wanted to know if amlodipine would interfere with treatment

## 2021-02-23 LAB — BASIC METABOLIC PANEL
BUN/Creatinine Ratio: 15 (ref 12–28)
BUN: 13 mg/dL (ref 8–27)
CO2: 25 mmol/L (ref 20–29)
Calcium: 9.7 mg/dL (ref 8.7–10.3)
Chloride: 98 mmol/L (ref 96–106)
Creatinine, Ser: 0.86 mg/dL (ref 0.57–1.00)
Glucose: 87 mg/dL (ref 65–99)
Potassium: 4.5 mmol/L (ref 3.5–5.2)
Sodium: 137 mmol/L (ref 134–144)
eGFR: 73 mL/min/{1.73_m2} (ref >59)

## 2021-02-23 NOTE — Progress Notes (Signed)
Kidney function normal

## 2021-02-26 ENCOUNTER — Encounter: Payer: Self-pay | Admitting: Hematology

## 2021-02-26 NOTE — Progress Notes (Signed)
[place]   Telephone:([phone] Fax:([phone]) [place] Note   Patient Care Team: Antony Blackbird, MD (Inactive) as PCP - General (Family Medicine) Mauro Kaufmann, RN as Oncology Nurse Navigator Rockwell Germany, RN as Oncology Nurse Navigator Truitt Merle, MD as Consulting Physician (Hematology) Rolm Bookbinder, MD as Consulting Physician (General Surgery) Gery Pray, MD as Consulting Physician (Radiation Oncology)  Date of Service:  02/27/2021   CHIEF COMPLAINTS/PURPOSE OF CONSULTATION:  Left Breast Cancer, ER+  REFERRING PHYSICIAN:  [facility]   Oncology History Overview Note  Cancer Staging Malignant neoplasm of upper-outer quadrant of left breast in female, estrogen receptor positive ([place]) Staging form: Breast, AJCC 8th Edition - Clinical stage from 02/19/2021: Stage IB (cT2, cN0, cM0, G2, ER+, PR+, HER2-) - Signed by Truitt Merle, MD on 02/26/2021 Stage prefix: Initial diagnosis Histologic grading system: 3 grade system    Malignant neoplasm of upper-outer quadrant of left breast in female, estrogen receptor positive ([place])  01/16/2021 Mammogram   DIGITAL SCREENING BILATERAL MAMMOGRAM WITH TOMOSYNTHESIS AND CAD  IMPRESSION: Further evaluation is suggested for a possible mass in the left breast.   02/08/2021 Mammogram   DIGITAL DIAGNOSTIC UNILATERAL LEFT MAMMOGRAM WITH TOMOSYNTHESIS AND CAD; ULTRASOUND LEFT BREAST LIMITED  IMPRESSION: 1. Highly suspicious left breast mass at the 12 o'clock position corresponding with the screening mammographic findings. It measures 2.6 x 2.1 x 1.5 cm. Recommendation is for ultrasound-guided biopsy. 2. Two additional indeterminate left breast masses at the 2 o'clock position 6 cm and 7 cm from the nipple. One of these likely corresponds with an additional mammographic mass. Recommendation is for ultrasound-guided biopsy. 3. No suspicious left axillary lymphadenopathy.     02/19/2021 Pathology Results    Diagnosis 1. Breast, left, needle core biopsy, 2 o'clock 6 cmfn (ribbon) - INTRADUCTAL PAPILLOMA WITHOUT ATYPIA 2. Breast, left, needle core biopsy, 2 o'clock 7 cmfn (coil) - DUCTAL CARCINOMA IN-SITU ARISING IN INTRADUCTAL PAPILLOMA - SEE COMMENT 3. Breast, left, needle core biopsy, 12 o'clock (heart) - INVASIVE DUCTAL CARCINOMA - SEE COMMENT Microscopic Comment 3. Based on the biopsy, the carcinoma appears Nottingham grade 2 of 3 and measures 0.7 cm in greatest linear extent.  3. PROGNOSTIC INDICATORS Results: IMMUNOHISTOCHEMICAL AND MORPHOMETRIC ANALYSIS PERFORMED MANUALLY The tumor cells are EQUIVOCAL for Her2 (2+). Her2 by FISH will be performed and results reported separately. Estrogen Receptor: 95%, POSITIVE, STRONG STAINING INTENSITY Progesterone Receptor: 95%, POSITIVE, STRONG STAINING INTENSITY Proliferation Marker Ki67: 5%  3. FLUORESCENCE IN-SITU HYBRIDIZATION Results: GROUP 5: HER2 **NEGATIVE** Equivocal form of amplification of the HER2 gene was detected in the IHC 2+ tissue sample received from this individual. HER2 FISH was performed by a technologist and cell imaging and analysis on the [employer]. RATIO OF HER2/CEN17 SIGNALS 1.43 AVERAGE HER2 COPY NUMBER PER CELL 2.00   02/19/2021 Cancer Staging   Staging form: Breast, AJCC 8th Edition - Clinical stage from 02/19/2021: Stage IB (cT2, cN0, cM0, G2, ER+, PR+, HER2-) - Signed by Truitt Merle, MD on 02/26/2021  Stage prefix: Initial diagnosis  Histologic grading system: 3 grade system    02/22/2021 Initial Diagnosis   Malignant neoplasm of upper-outer quadrant of left breast in female, estrogen receptor positive ([place])      HISTORY OF PRESENTING ILLNESS:  Terri Hawkins 70 y.o. female is a here because of breast cancer. The patient was referred by breast cancer. The patient presents to the clinic today by herself.   This was discuss by screening mammogram, no palpable  mass or new symptoms before her screening  mammogram on January 16, 2021.  She has not had screening mammogram for the past 6 years.  She underwent diagnostic mammogram on 21 June 22, which showed a 2.6 x 2.9 x 1.5 cm mass at 12 o'clock position, and a 2 additional 0.6 cm masses at 2:00 position.  Biopsy of the 12:00 mass showed invasive ductal carcinoma, ER and PR strongly positive, HER2 negative.  The other 2 biopsy showed intraductal papilloma and intact. no suspicious left axillary lymphadenopathy.  Biopsy on 02/19/21 showed:  Left Breast, 2 o'clock, 6 cmfn - intraductal papilloma without atypia 2. Left Breast, 2 o'clock, 7 cmfn - DCIS arising in intraductal papilloma 3. Left Breast, 12 o'clock - invasive ductal carcinoma, grade 2.  - ER+, PR+ - Her2 negative by FISH - Ki67 at 5%. HER2  Patient is a retired [employer].  She lives alone.  She feels well overall, denies any pain, or echocardiogram, no other discomfort.  She has good appetite and energy level, she has tried to lose weight lately, and did lose 10 to 12 pounds in the past 6 weeks.   She has a PMHx of hypertension.  GYN HISTORY  Menarchal: 13 LMP: 48 Contraceptive: a few years  HRT: no G2P2: she did breast feeding, she was 69 with first pregnancy     REVIEW OF SYSTEMS:    Constitutional: Denies fevers, chills or abnormal night sweats, 10-12 lbs weight loss (intentional) in past 6 weeks  Eyes: Denies blurriness of vision, double vision or watery eyes Ears, nose, mouth, throat, and face: Denies mucositis or sore throat Respiratory: Denies cough, dyspnea or wheezes Cardiovascular: Denies palpitation, chest discomfort or lower extremity swelling Gastrointestinal:  Denies nausea, heartburn or change in bowel habits Skin: Denies abnormal skin rashes Lymphatics: Denies new lymphadenopathy or easy bruising Neurological:Denies numbness, tingling or new weaknesses Behavioral/Psych: Mood is stable, no new changes  All other systems were reviewed with the patient and  are negative.   MEDICAL HISTORY:  Past Medical History:  Diagnosis Date   Allergy 2016, I think   Yellow jacket sting   Breast cancer ([place]) 02/2021   Eczema    Family history of breast cancer 02/27/2021   Family history of glioblastoma 02/27/2021   Family history of melanoma 02/27/2021   Hyperlipidemia    Hypertension    Obesity    Pre-diabetes    Skin cancer Not sure of date   [facility]   Vitamin D deficiency     SURGICAL HISTORY: Past Surgical History:  Procedure Laterality Date   CESAREAN SECTION     x3   CHOLECYSTECTOMY     TUBAL LIGATION     Not sure of date    SOCIAL HISTORY: Social History   Socioeconomic History   Marital status: Divorced    Spouse name: Not on file   Number of children: 2   Years of education: 12+   Highest education level: Not on file  Occupational History   Not on file  Tobacco Use   Smoking status: Never   Smokeless tobacco: Never  Substance and Sexual Activity   Alcohol use: Yes    Alcohol/week: 1.0 standard drink    Types: 1 Glasses of wine per week    Comment: Some weeks 0 wine   Drug use: No   Sexual activity: Not Currently    Birth control/protection: Diaphragm, I.U.D., Pill, Post-menopausal, Other-see comments    Comment: Tubal ligation  Other Topics Concern  Not on file  Social History Narrative   Not on file   Social Determinants of Health   Financial Resource Strain: Not on file  Food Insecurity: No Food Insecurity   Worried About Running Out of Food in the Last Year: Never true   Ran Out of Food in the Last Year: Never true  Transportation Needs: No Transportation Needs   Lack of Transportation (Medical): No   Lack of Transportation (Non-Medical): No  Physical Activity: Not on file  Stress: Not on file  Social Connections: Not on file  Intimate Partner Violence: Not on file    FAMILY HISTORY: Family History  Problem Relation Age of Onset   Breast cancer Mother 75   Diabetes Father    Brain  cancer Father    Cancer Father 18       glioblastoma   Breast cancer Maternal Aunt        dx after 22   Melanoma Maternal Uncle        dx after 50, arm    ALLERGIES:  is allergic to zocor [simvastatin], wasp venom protein, and yellow jacket venom [bee venom].  MEDICATIONS:  Current Outpatient Medications  Medication Sig Dispense Refill   anastrozole (ARIMIDEX) 1 MG tablet Take 1 tablet (1 mg total) by mouth daily. 30 tablet 3   Multiple Vitamin (MULTIVITAMIN) capsule Take 1 capsule by mouth daily.     amLODipine (NORVASC) 5 MG tablet Take 1 tablet (5 mg total) by mouth daily. 90 tablet 0   Cholecalciferol 25 MCG (1000 UT) CHEW Vitamin D3 25 mcg (1,000 unit) chewable tablet  Take 1 tablet every day by oral route.     No current facility-administered medications for this visit.    PHYSICAL EXAMINATION: ECOG PERFORMANCE STATUS: 0 - Asymptomatic  Vitals:   02/27/21 0851  BP: (!) 143/66  Pulse: 72  Resp: 18  Temp: 98 F (36.7 C)  SpO2: 99%   Filed Weights   02/27/21 0851  Weight: 191 lb (86.6 kg)    GENERAL:alert, no distress and comfortable SKIN: skin color, texture, turgor are normal, no rashes or significant lesions EYES: normal, Conjunctiva are pink and non-injected, sclera clear NECK: supple, thyroid normal size, non-tender, without nodularity LYMPH:  no palpable lymphadenopathy in the cervical, axillary  LUNGS: clear to auscultation and percussion with normal breathing effort HEART: regular rate & rhythm and no murmurs and no lower extremity edema ABDOMEN:abdomen soft, non-tender and normal bowel sounds Musculoskeletal:no cyanosis of digits and no clubbing  NEURO: alert & oriented x 3 with fluent speech, no focal motor/sensory deficits BREAST:Multiple bruises on left breast. There is a little radiation 4X2cm mass at 12:00 position. No other palpable mass, nodules or adenopathy bilaterally. Right breast exam benign.  LABORATORY DATA:  I have reviewed the data as  listed CBC Latest Ref Rng & Units 02/27/2021 08/18/2013 08/16/2012  WBC 4.0 - 10.5 K/uL 7.3 8.8 9.8  Hemoglobin 12.0 - 15.0 g/dL 12.9 13.8 13.5  Hematocrit 36.0 - 46.0 % 39.7 41.5 40.1  Platelets 150 - 400 K/uL 247 312 333    CMP Latest Ref Rng & Units 02/27/2021 02/22/2021 05/02/2019  Glucose 70 - 99 mg/dL 105(H) 87 86  BUN 8 - 23 mg/dL _0 Creatinine 0.44 - 1.00 mg/dL 0.84 0.86 0.88  Sodium 135 - 145 mmol/L 140 137 139  Potassium 3.5 - 5.1 mmol/L 4.3 4.5 4.3  Chloride 98 - 111 mmol/L 105 98 100  CO2 22 - 32 mmol/L 26  25 22  Calcium 8.9 - 10.3 mg/dL 9.7 9.7 10.0  Total Protein 6.5 - 8.1 g/dL 7.3 - 7.3  Total Bilirubin 0.3 - 1.2 mg/dL 0.6 - 0.4  Alkaline Phos 38 - 126 U/L 80 - 91  AST 15 - 41 U/L 19 - 21  ALT 0 - 44 U/L 24 - 26     RADIOGRAPHIC STUDIES: I have personally reviewed the radiological images as listed and agreed with the findings in the report. US BREAST LTD UNI LEFT INC AXILLA  Result Date: 02/08/2021 CLINICAL DATA:  70 year old female recalled from screening mammogram dated 01/16/2021 for a possible left breast mass. EXAM: DIGITAL DIAGNOSTIC UNILATERAL LEFT MAMMOGRAM WITH TOMOSYNTHESIS AND CAD; ULTRASOUND LEFT BREAST LIMITED TECHNIQUE: Left digital diagnostic mammography and breast tomosynthesis was performed. The images were evaluated with computer-aided detection.; Targeted ultrasound examination of the left breast was performed COMPARISON:  Previous exam(s). ACR Breast Density Category b: There are scattered areas of fibroglandular density. FINDINGS: There is a persistent irregular hyperdense mass in the superior central left breast at mid depth. An additional similar appearing mass is identified in the upper outer quadrant at mid to posterior depth. Further evaluation with ultrasound was performed. Targeted ultrasound is performed, showing an irregular hypoechoic mass with associated vascularity at the 12 o'clock position 4 cm from the nipple. It measures 2.6 x 2.1 x 1.5  cm. This correlates with the mammographic finding. Two additional smaller but similar appearing masses are identified at the 2 o'clock position 6 cm from the nipple in the 2 o'clock position 7 cm from the nipple. They measure 6 x 6 x 3 mm and 6 x 6 x 7 mm respectively. One of these likely corresponds with the additional mammographically identified mass. Evaluation of the left axilla demonstrates no suspicious lymphadenopathy. IMPRESSION: 1. Highly suspicious left breast mass at the 12 o'clock position corresponding with the screening mammographic findings recommendation is for ultrasound-guided biopsy. 2. Two additional indeterminate left breast masses at the 2 o'clock position 6 cm and 7 cm from the nipple. One of these likely corresponds with an additional mammographic mass. Recommendation is for ultrasound-guided biopsy. 3. No suspicious left axillary lymphadenopathy. RECOMMENDATION: Three area ultrasound-guided biopsy of the left breast. I have discussed the findings and recommendations with the patient. If applicable, a reminder letter will be sent to the patient regarding the next appointment. BI-RADS CATEGORY  5: Highly suggestive of malignancy. Electronically Signed   By: Kristopher Oppenheim M.D.   On: 02/08/2021 14:35  MM DIAG BREAST TOMO UNI LEFT  Result Date: 02/08/2021 CLINICAL DATA:  70 year old female recalled from screening mammogram dated 01/16/2021 for a possible left breast mass. EXAM: DIGITAL DIAGNOSTIC UNILATERAL LEFT MAMMOGRAM WITH TOMOSYNTHESIS AND CAD; ULTRASOUND LEFT BREAST LIMITED TECHNIQUE: Left digital diagnostic mammography and breast tomosynthesis was performed. The images were evaluated with computer-aided detection.; Targeted ultrasound examination of the left breast was performed COMPARISON:  Previous exam(s). ACR Breast Density Category b: There are scattered areas of fibroglandular density. FINDINGS: There is a persistent irregular hyperdense mass in the superior central left breast  at mid depth. An additional similar appearing mass is identified in the upper outer quadrant at mid to posterior depth. Further evaluation with ultrasound was performed. Targeted ultrasound is performed, showing an irregular hypoechoic mass with associated vascularity at the 12 o'clock position 4 cm from the nipple. It measures 2.6 x 2.1 x 1.5 cm. This correlates with the mammographic finding. Two additional smaller but similar appearing masses are identified at the  2 o'clock position 6 cm from the nipple in the 2 o'clock position 7 cm from the nipple. They measure 6 x 6 x 3 mm and 6 x 6 x 7 mm respectively. One of these likely corresponds with the additional mammographically identified mass. Evaluation of the left axilla demonstrates no suspicious lymphadenopathy. IMPRESSION: 1. Highly suspicious left breast mass at the 12 o'clock position corresponding with the screening mammographic findings recommendation is for ultrasound-guided biopsy. 2. Two additional indeterminate left breast masses at the 2 o'clock position 6 cm and 7 cm from the nipple. One of these likely corresponds with an additional mammographic mass. Recommendation is for ultrasound-guided biopsy. 3. No suspicious left axillary lymphadenopathy. RECOMMENDATION: Three area ultrasound-guided biopsy of the left breast. I have discussed the findings and recommendations with the patient. If applicable, a reminder letter will be sent to the patient regarding the next appointment. BI-RADS CATEGORY  5: Highly suggestive of malignancy. Electronically Signed   By: Kristopher Oppenheim M.D.   On: 02/08/2021 14:35  MM CLIP PLACEMENT LEFT  Result Date: 02/19/2021 CLINICAL DATA:  Patient status post ultrasound-guided core needle biopsy 3 left breast masses. EXAM: 3D DIAGNOSTIC LEFT MAMMOGRAM POST ULTRASOUND BIOPSY COMPARISON:  Previous exam(s). FINDINGS: Site 1: Left breast mass 2 o'clock position 6 cm from nipple: Ribbon shaped clip: In appropriate position Site 2:  Left breast mass 2 o'clock position 7 cm from nipple: Coil clip: In appropriate position Site 3: Left breast mass 12 o'clock position: Heart clip: In appropriate position IMPRESSION: Appropriate positioning of the biopsy marking clips as above. Final Assessment: Post Procedure Mammograms for Marker Placement Electronically Signed   By: Lovey Newcomer M.D.   On: 02/19/2021 11:48  [place] LT BREAST BX W LOC DEV 1ST LESION IMG BX SPEC US GUIDE  Addendum Date: 02/22/2021   ADDENDUM REPORT: 02/21/2021 15:00 ADDENDUM: Pathology revealed INTRADUCTAL PAPILLOMA WITHOUT ATYPIA of the LEFT breast, 2 o'clock 6 cmfn (ribbon clip). This was found to be concordant by Dr. Lovey Newcomer. Pathology revealed DUCTAL CARCINOMA IN-SITU ARISING IN INTRADUCTAL PAPILLOMA of the LEFT breast, 2 o'clock 7 cmfn (coil clip). This was found to be concordant by Dr. Lovey Newcomer. Pathology revealed GRADE II INVASIVE DUCTAL CARCINOMA of the LEFT breast, 12 o'clock (heart clip). This was found to be concordant by Dr. Lovey Newcomer. Pathology results were discussed with the patient by telephone. The patient reported doing well after the biopsies with tenderness at the sites. Post biopsy instructions and care were reviewed and questions were answered. The patient was encouraged to call [facility] for any additional concerns. My direct phone number was provided. The patient was referred to [facility] on February 27, 2021. Recommendation for a bilateral breast MRI to exclude any additional sites of disease. Pathology results reported by Terie Purser, RN on 02/21/2021. Electronically Signed   By: Lovey Newcomer M.D.   On: 02/21/2021 15:00   Result Date: 02/22/2021 CLINICAL DATA:  Patient with 3 suspicious left breast masses. EXAM: ULTRASOUND GUIDED LEFT BREAST CORE NEEDLE BIOPSY COMPARISON:  Previous exam(s). PROCEDURE: I met with the patient and we discussed the  procedure of ultrasound-guided biopsy, including benefits and alternatives. We discussed the high likelihood of a successful procedure. We discussed the risks of the procedure, including infection, bleeding, tissue injury, clip migration, and inadequate sampling. Informed written consent was given. The usual time-out protocol was performed immediately prior to the procedure. Site 1: Left  breast mass 2 o'clock position 6 cm from nipple: Ribbon shaped clip Lesion quadrant: Upper outer quadrant Using sterile technique and 1% Lidocaine as local anesthetic, under direct ultrasound visualization, a 14 gauge spring-loaded device was used to perform biopsy of left breast mass 2 o'clock position 6 cm from nipple using a medial approach. At the conclusion of the procedure ribbon shaped tissue marker clip was deployed into the biopsy cavity. Follow up 2 view mammogram was performed and dictated separately. Site 2: Left breast mass 2 o'clock position 7 cm from nipple: Coil clip Lesion quadrant: Upper outer quadrant Using sterile technique and 1% Lidocaine as local anesthetic, under direct ultrasound visualization, a 14 gauge spring-loaded device was used to perform biopsy of left breast mass 2 o'clock position 7 cm from the nipple using a medial approach. At the conclusion of the procedure coil tissue marker clip was deployed into the biopsy cavity. Follow up 2 view mammogram was performed and dictated separately. Site 3: Left breast mass 12 o'clock position: Heart clip Lesion quadrant: Upper outer quadrant Using sterile technique and 1% Lidocaine as local anesthetic, under direct ultrasound visualization, a 14 gauge spring-loaded device was used to perform biopsy of left breast mass 12 o'clock position using a medial approach. At the conclusion of the procedure heart shaped tissue marker clip was deployed into the biopsy cavity. Follow up 2 view mammogram was performed and dictated separately. IMPRESSION: Ultrasound guided  biopsy of left breast masses as above. No apparent complications. Electronically Signed: By: Lovey Newcomer M.D. On: 02/19/2021 11:43  [place] LT BREAST BX W LOC DEV EA ADD LESION IMG BX SPEC US GUIDE  Addendum Date: 02/22/2021   ADDENDUM REPORT: 02/21/2021 15:00 ADDENDUM: Pathology revealed INTRADUCTAL PAPILLOMA WITHOUT ATYPIA of the LEFT breast, 2 o'clock 6 cmfn (ribbon clip). This was found to be concordant by Dr. Lovey Newcomer. Pathology revealed DUCTAL CARCINOMA IN-SITU ARISING IN INTRADUCTAL PAPILLOMA of the LEFT breast, 2 o'clock 7 cmfn (coil clip). This was found to be concordant by Dr. Lovey Newcomer. Pathology revealed GRADE II INVASIVE DUCTAL CARCINOMA of the LEFT breast, 12 o'clock (heart clip). This was found to be concordant by Dr. Lovey Newcomer. Pathology results were discussed with the patient by telephone. The patient reported doing well after the biopsies with tenderness at the sites. Post biopsy instructions and care were reviewed and questions were answered. The patient was encouraged to call [facility] for any additional concerns. My direct phone number was provided. The patient was referred to [facility] on February 27, 2021. Recommendation for a bilateral breast MRI to exclude any additional sites of disease. Pathology results reported by Terie Purser, RN on 02/21/2021. Electronically Signed   By: Lovey Newcomer M.D.   On: 02/21/2021 15:00   Result Date: 02/22/2021 CLINICAL DATA:  Patient with 3 suspicious left breast masses. EXAM: ULTRASOUND GUIDED LEFT BREAST CORE NEEDLE BIOPSY COMPARISON:  Previous exam(s). PROCEDURE: I met with the patient and we discussed the procedure of ultrasound-guided biopsy, including benefits and alternatives. We discussed the high likelihood of a successful procedure. We discussed the risks of the procedure, including infection, bleeding, tissue injury, clip migration, and  inadequate sampling. Informed written consent was given. The usual time-out protocol was performed immediately prior to the procedure. Site 1: Left breast mass 2 o'clock position 6 cm from nipple: Ribbon shaped clip Lesion quadrant: Upper outer quadrant Using sterile technique and 1% Lidocaine as  local anesthetic, under direct ultrasound visualization, a 14 gauge spring-loaded device was used to perform biopsy of left breast mass 2 o'clock position 6 cm from nipple using a medial approach. At the conclusion of the procedure ribbon shaped tissue marker clip was deployed into the biopsy cavity. Follow up 2 view mammogram was performed and dictated separately. Site 2: Left breast mass 2 o'clock position 7 cm from nipple: Coil clip Lesion quadrant: Upper outer quadrant Using sterile technique and 1% Lidocaine as local anesthetic, under direct ultrasound visualization, a 14 gauge spring-loaded device was used to perform biopsy of left breast mass 2 o'clock position 7 cm from the nipple using a medial approach. At the conclusion of the procedure coil tissue marker clip was deployed into the biopsy cavity. Follow up 2 view mammogram was performed and dictated separately. Site 3: Left breast mass 12 o'clock position: Heart clip Lesion quadrant: Upper outer quadrant Using sterile technique and 1% Lidocaine as local anesthetic, under direct ultrasound visualization, a 14 gauge spring-loaded device was used to perform biopsy of left breast mass 12 o'clock position using a medial approach. At the conclusion of the procedure heart shaped tissue marker clip was deployed into the biopsy cavity. Follow up 2 view mammogram was performed and dictated separately. IMPRESSION: Ultrasound guided biopsy of left breast masses as above. No apparent complications. Electronically Signed: By: Lovey Newcomer M.D. 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 This was found to be concordant by Dr. Lovey Newcomer. Pathology revealed DUCTAL CARCINOMA IN-SITU ARISING IN INTRADUCTAL PAPILLOMA of the LEFT breast, 2 o'clock 7 cmfn (coil clip). This was found to be concordant by Dr. Lovey Newcomer. Pathology revealed GRADE II INVASIVE DUCTAL CARCINOMA of the LEFT breast, 12 o'clock (heart clip). This was found to be concordant by Dr. Lovey Newcomer. Pathology results were discussed with the patient by telephone. The patient reported doing well after the biopsies with tenderness at the sites. Post biopsy instructions and care were reviewed and questions were answered. The patient was encouraged to call [facility] for any additional concerns. My direct phone number was provided. The patient was referred to [facility] at [facility] on February 27, 2021. Recommendation for a bilateral breast MRI to exclude any additional sites of disease. Pathology results reported by Terie Purser, RN on 02/21/2021. Electronically Signed   By: Lovey Newcomer M.D.   On: 02/21/2021 15:00   Result Date: 02/22/2021 CLINICAL DATA:  Patient with 3 suspicious left breast masses. EXAM: ULTRASOUND GUIDED LEFT BREAST CORE NEEDLE BIOPSY COMPARISON:  Previous exam(s). PROCEDURE: I met with the patient and we discussed the procedure of ultrasound-guided biopsy, including benefits and alternatives. We discussed the high likelihood of a successful procedure. We discussed the risks of the procedure, including infection, bleeding, tissue injury, clip migration, and inadequate sampling. Informed written consent was given. The usual time-out protocol was performed immediately prior to the procedure. Site 1: Left breast mass 2 o'clock position 6 cm from nipple: Ribbon shaped clip Lesion quadrant: Upper outer  quadrant Using sterile technique and 1% Lidocaine as local anesthetic, under direct ultrasound visualization, a 14 gauge spring-loaded device was used to perform biopsy of left breast mass 2 o'clock position 6  cm from nipple using a medial approach. At the conclusion of the procedure ribbon shaped tissue marker clip was deployed into the biopsy cavity. Follow up 2 view mammogram was performed and dictated separately. Site 2: Left breast mass 2 o'clock position 7 cm from nipple: Coil clip Lesion quadrant: Upper outer quadrant Using sterile technique and 1% Lidocaine as local anesthetic, under direct ultrasound visualization, a 14 gauge spring-loaded device was used to perform biopsy of left breast mass 2 o'clock position 7 cm from the nipple using a medial approach. At the conclusion of the procedure coil tissue marker clip was deployed into the biopsy cavity. Follow up 2 view mammogram was performed and dictated separately. Site 3: Left breast mass 12 o'clock position: Heart clip Lesion quadrant: Upper outer quadrant Using sterile technique and 1% Lidocaine as local anesthetic, under direct ultrasound visualization, a 14 gauge spring-loaded device was used to perform biopsy of left breast mass 12 o'clock position using a medial approach. At the conclusion of the procedure heart shaped tissue marker clip was deployed into the biopsy cavity. Follow up 2 view mammogram was performed and dictated separately. IMPRESSION: Ultrasound guided biopsy of left breast masses as above. No apparent complications. Electronically Signed: By: Lovey Newcomer M.D. On: 02/19/2021 11:43   ASSESSMENT & PLAN:  Terri Hawkins is a 70 y.o. female with a history of HTN   Malignant neoplasm of upper-outer quadrant of left breast, Invasive Ductal Carcinoma, Stage IB , cT2N0M0, ER+/PR+/HER2-, Grade 2  -left breast mass found on screening mammogram 01/2021. Biopsy on 02/19/21 showed grade 2 IDC at 12 o'clock and DCIS at 2 o'clock, ER+, PR+,  Her2- by FISH. ---We discussed her imaging findings and the biopsy results in great details. -Giving the size of her tumor, she may need mastectomy, she is agreeable with that. She was seen by Dr. Donne Hazel today and likely will proceed with surgery soon.  -I recommend a Oncotype Dx test on the surgical sample and we'll make a decision about adjuvant chemotherapy based on the Oncotype result. Written material of this test was given to her. She is 70 yo, but overall in good health, would be a good candidate for chemotherapy if her Oncotype recurrence score is high. -If her surgical sentinel lymph node node positive, I recommend mammaprint for further risk stratification and guide adjuvant chemotherapy. -Giving the strong ER and PR expression in her postmenopausal status, I recommend adjuvant endocrine therapy with aromatase inhibitor anastrozole for a total of 5-10 years to reduce the risk of cancer recurrence.  -- The potential benefit and side effects, which includes but not limited to, hot flash, skin and vaginal dryness, metabolic changes ( increased blood glucose, cholesterol, weight, etc.), slightly in increased risk of cardiovascular disease, cataracts, muscular and joint discomfort, osteopenia and osteoporosis, etc, were discussed with her in great details. She is interested, will start her on anastrozole now while she is waiting for surgery  -She was also seen by radiation oncologist Dr. Earney Hamburg today. If her surgical sentinel lymph nodes were negative, she probably would not need post mastectomy radiation.  -We also discussed the breast cancer surveillance after her surgery. She will continue annual screening mammogram, self exam, and a routine office visit with lab and exam with [place]. -I encouraged her to have healthy diet and exercise regularly.  -I reviewed her lab results from today with patient   PLAN:  -I called in anastrozole 1 mg daily for her today, she will start while she is waiting  for surgery -She will proceed with left mastectomy and sentinel lymph node biopsy with Dr. Donne Hazel soon -Oncotype on her surgical sample, or MammaPrint if lymph nodes positive -I will see her after surgery -She will see genetic counselor today -We will repeat her bone density scan in the next months   Orders Placed This Encounter  Procedures   DG Bone Density    Standing Status:   Future    Standing Expiration Date:   02/27/2022    Order Specific Question:   Reason for Exam (SYMPTOM  OR DIAGNOSIS REQUIRED)    Answer:   screening    Order Specific Question:   Preferred imaging location?    Answer:   [facility]     All questions were answered. The patient knows to call the clinic with any problems, questions or concerns. The total time spent in the appointment was 60 minutes.     Truitt Merle, MD 02/27/2021   I, Wilburn Mylar, am acting as scribe for Truitt Merle, MD.   I have reviewed the above documentation for accuracy and completeness, and I agree with the above.

## 2021-02-27 ENCOUNTER — Inpatient Hospital Stay: Payer: Medicare Other | Admitting: Licensed Clinical Social Worker

## 2021-02-27 ENCOUNTER — Inpatient Hospital Stay: Payer: Medicare Other

## 2021-02-27 ENCOUNTER — Inpatient Hospital Stay: Payer: Medicare Other | Attending: Hematology | Admitting: Hematology

## 2021-02-27 ENCOUNTER — Ambulatory Visit (HOSPITAL_BASED_OUTPATIENT_CLINIC_OR_DEPARTMENT_OTHER): Payer: Medicare Other | Admitting: Genetic Counselor

## 2021-02-27 ENCOUNTER — Other Ambulatory Visit: Payer: Self-pay

## 2021-02-27 ENCOUNTER — Encounter: Payer: Self-pay | Admitting: Hematology

## 2021-02-27 ENCOUNTER — Ambulatory Visit
Admission: RE | Admit: 2021-02-27 | Discharge: 2021-02-27 | Disposition: A | Discharge status: Home / Self Care | Payer: Medicare Other | Source: Ambulatory Visit | Attending: Radiation Oncology | Admitting: Radiation Oncology

## 2021-02-27 ENCOUNTER — Other Ambulatory Visit: Payer: Self-pay | Admitting: *Deleted

## 2021-02-27 ENCOUNTER — Encounter: Payer: Self-pay | Admitting: Genetic Counselor

## 2021-02-27 ENCOUNTER — Encounter: Payer: Self-pay | Admitting: *Deleted

## 2021-02-27 VITALS — BP 143/66 | HR 72 | Temp 98.0°F | Resp 18 | Ht 64.25 in | Wt 191.0 lb

## 2021-02-27 DIAGNOSIS — C50412 Malignant neoplasm of upper-outer quadrant of left female breast: Secondary | ICD-10-CM

## 2021-02-27 DIAGNOSIS — E2839 Other primary ovarian failure: Secondary | ICD-10-CM

## 2021-02-27 DIAGNOSIS — Z808 Family history of malignant neoplasm of other organs or systems: Secondary | ICD-10-CM | POA: Insufficient documentation

## 2021-02-27 DIAGNOSIS — Z79811 Long term (current) use of aromatase inhibitors: Secondary | ICD-10-CM | POA: Diagnosis not present

## 2021-02-27 DIAGNOSIS — Z170 Estrogen receptor positive status [ER+]: Secondary | ICD-10-CM | POA: Insufficient documentation

## 2021-02-27 DIAGNOSIS — Z803 Family history of malignant neoplasm of breast: Secondary | ICD-10-CM | POA: Insufficient documentation

## 2021-02-27 HISTORY — DX: Family history of malignant neoplasm of breast: Z80.3

## 2021-02-27 HISTORY — DX: Family history of malignant neoplasm of other organs or systems: Z80.8

## 2021-02-27 LAB — CMP (CANCER CENTER ONLY)
ALT: 24 U/L (ref 0–44)
AST: 19 U/L (ref 15–41)
Albumin: 4 g/dL (ref 3.5–5.0)
Alkaline Phosphatase: 80 U/L (ref 38–126)
Anion gap: 9 (ref 5–15)
BUN: 13 mg/dL (ref 8–23)
CO2: 26 mmol/L (ref 22–32)
Calcium: 9.7 mg/dL (ref 8.9–10.3)
Chloride: 105 mmol/L (ref 98–111)
Creatinine: 0.84 mg/dL (ref 0.44–1.00)
GFR, Estimated: >60 mL/min (ref >60)
Glucose, Bld: 105 mg/dL — ABNORMAL HIGH (ref 70–99)
Potassium: 4.3 mmol/L (ref 3.5–5.1)
Sodium: 140 mmol/L (ref 135–145)
Total Bilirubin: 0.6 mg/dL (ref 0.3–1.2)
Total Protein: 7.3 g/dL (ref 6.5–8.1)

## 2021-02-27 LAB — CBC WITH DIFFERENTIAL (CANCER CENTER ONLY)
Abs Immature Granulocytes: 0.01 10*3/uL (ref 0.00–0.07)
Basophils Absolute: 0 10*3/uL (ref 0.0–0.1)
Basophils Relative: 0 %
Eosinophils Absolute: 0.1 10*3/uL (ref 0.0–0.5)
Eosinophils Relative: 2 %
HCT: 39.7 % (ref 36.0–46.0)
Hemoglobin: 12.9 g/dL (ref 12.0–15.0)
Immature Granulocytes: 0 %
Lymphocytes Relative: 34 %
Lymphs Abs: 2.5 10*3/uL (ref 0.7–4.0)
MCH: 29.7 pg (ref 26.0–34.0)
MCHC: 32.5 g/dL (ref 30.0–36.0)
MCV: 91.3 fL (ref 80.0–100.0)
Monocytes Absolute: 0.5 10*3/uL (ref 0.1–1.0)
Monocytes Relative: 7 %
Neutro Abs: 4.1 10*3/uL (ref 1.7–7.7)
Neutrophils Relative %: 57 %
Platelet Count: 247 10*3/uL (ref 150–400)
RBC: 4.35 MIL/uL (ref 3.87–5.11)
RDW: 13.6 % (ref 11.5–15.5)
WBC Count: 7.3 10*3/uL (ref 4.0–10.5)
nRBC: 0 % (ref 0.0–0.2)

## 2021-02-27 LAB — GENETIC SCREENING ORDER

## 2021-02-27 MED ORDER — ANASTROZOLE 1 MG PO TABS: 1.0000 mg | ORAL_TABLET | Freq: Every day | ORAL | 3 refills | Status: DC

## 2021-02-27 NOTE — Progress Notes (Signed)
REFERRING PROVIDER: Truitt Merle, MD [place],  [place] [address]  PRIMARY PROVIDER:  Antony Blackbird, MD (Inactive)  PRIMARY REASON FOR VISIT:  Encounter Diagnoses  Name Primary?   Malignant neoplasm of upper-outer quadrant of left breast in female, estrogen receptor positive ([place]) Yes   Family history of breast cancer    Family history of melanoma    Family history of glioblastoma      HISTORY OF PRESENT ILLNESS:   Terri Hawkins, a 70 y.o. female, was seen for a [place] cancer genetics consultation during the breast multidisciplinary clinic at the request of Dr. Burr Medico due to a personal and family history of cancer.  Terri Hawkins presents to clinic today to discuss the possibility of a hereditary predisposition to cancer, to discuss genetic testing, and to further clarify her future cancer risks, as well as potential cancer risks for family members.   In July 2022, at the age of 56, Terri Hawkins was diagnosed with invasive ductal carcinoma of the left breast (ER+/PR+/HER2-). The preliminary treatment plan includes mastectomy, Oncotype to determine potential benefit of chemothearpy, and anti-estrogens.  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   CANCER HISTORY:  Oncology History Overview Note  Cancer Staging Malignant neoplasm of upper-outer quadrant of left breast in female, estrogen receptor positive ([place]) Staging form: Breast, AJCC 8th Edition - Clinical stage from 02/19/2021: Stage IB (cT2, cN0, cM0, G2, ER+, PR+, HER2-) - Signed by Truitt Merle, MD on 02/26/2021 Stage prefix: Initial diagnosis Histologic grading system: 3 grade system    Malignant neoplasm of upper-outer quadrant of left breast in female, estrogen receptor positive ([place])  01/16/2021 Mammogram   DIGITAL SCREENING BILATERAL MAMMOGRAM WITH TOMOSYNTHESIS AND CAD  IMPRESSION: Further evaluation is suggested for a possible mass in the left breast.    02/08/2021 Mammogram   DIGITAL DIAGNOSTIC UNILATERAL LEFT MAMMOGRAM WITH TOMOSYNTHESIS AND CAD; ULTRASOUND LEFT BREAST LIMITED  IMPRESSION: 1. Highly suspicious left breast mass at the 12 o'clock position corresponding with the screening mammographic findings. It measures 2.6 x 2.1 x 1.5 cm. Recommendation is for ultrasound-guided biopsy. 2. Two additional indeterminate left breast masses at the 2 o'clock position 6 cm and 7 cm from the nipple. One of these likely corresponds with an additional mammographic mass. Recommendation is for ultrasound-guided biopsy. 3. No suspicious left axillary lymphadenopathy.     02/19/2021 Pathology Results   Diagnosis 1. Breast, left, needle core biopsy, 2 o'clock 6 cmfn (ribbon) - INTRADUCTAL PAPILLOMA WITHOUT ATYPIA 2. Breast, left, needle core biopsy, 2 o'clock 7 cmfn (coil) - DUCTAL CARCINOMA IN-SITU ARISING IN INTRADUCTAL PAPILLOMA - SEE COMMENT 3. Breast, left, needle core biopsy, 12 o'clock (heart) - INVASIVE DUCTAL CARCINOMA - SEE COMMENT Microscopic Comment 3. Based on the biopsy, the carcinoma appears Nottingham grade 2 of 3 and measures 0.7 cm in greatest linear extent.  3. PROGNOSTIC INDICATORS Results: IMMUNOHISTOCHEMICAL AND MORPHOMETRIC ANALYSIS PERFORMED MANUALLY The tumor cells are EQUIVOCAL for Her2 (2+). Her2 by FISH will be performed and results reported separately. Estrogen Receptor: 95%, POSITIVE, STRONG STAINING INTENSITY Progesterone Receptor: 95%, POSITIVE, STRONG STAINING INTENSITY Proliferation Marker Ki67: 5%  3. FLUORESCENCE IN-SITU HYBRIDIZATION Results: GROUP 5: HER2 **NEGATIVE** Equivocal form of amplification of the HER2 gene was detected in the IHC 2+ tissue sample received from this individual. HER2 FISH was performed by a technologist and cell imaging and analysis on the Bio[employer]. RATIO OF HER2/CEN17 SIGNALS 1.43 AVERAGE HER2 COPY NUMBER PER CELL  2.00   02/19/2021 Cancer Staging   Staging form: Breast, AJCC  8th Edition - Clinical stage from 02/19/2021: Stage IB (cT2, cN0, cM0, G2, ER+, PR+, HER2-) - Signed by Truitt Merle, MD on 02/26/2021  Stage prefix: Initial diagnosis  Histologic grading system: 3 grade system    02/22/2021 Initial Diagnosis   Malignant neoplasm of upper-outer quadrant of left breast in female, estrogen receptor positive ([place])     RISK FACTORS:  Menarche was at age 89.  First live birth at age 18.  OCP use for approximately 2 years.  Ovaries intact: yes.  Hysterectomy: no.  Menopausal status: postmenopausal.  HRT use: 0 years. Colonoscopy: yes;  at age 9 . Mammogram within the last year: yes. Up to date with pelvic exams: most recent PAP in 2014.   Past Medical History:  Diagnosis Date   Allergy 2016, I think   Yellow jacket sting   Breast cancer ([place]) 02/2021   Eczema    Hyperlipidemia    Hypertension    Obesity    Pre-diabetes    Skin cancer Not sure of date   [facility]   Vitamin D deficiency     Past Surgical History:  Procedure Laterality Date   CESAREAN SECTION     x3   CHOLECYSTECTOMY     TUBAL LIGATION     Not sure of date    Social History   Socioeconomic History   Marital status: Divorced    Spouse name: Not on file   Number of children: 2   Years of education: 12+   Highest education level: Not on file  Occupational History   Not on file  Tobacco Use   Smoking status: Never   Smokeless tobacco: Never  Substance and Sexual Activity   Alcohol use: Yes    Alcohol/week: 1.0 standard drink    Types: 1 Glasses of wine per week    Comment: Some weeks 0 wine   Drug use: No   Sexual activity: Not Currently    Birth control/protection: Diaphragm, I.U.D., Pill, Post-menopausal, Other-see comments    Comment: Tubal ligation  Other Topics Concern   Not on file  Social History Narrative   Not on file   Social Determinants of Health   Financial Resource Strain: Not on file  Food Insecurity: Not on file   Transportation Needs: Not on file  Physical Activity: Not on file  Stress: Not on file  Social Connections: Not on file     FAMILY HISTORY:  We obtained a detailed, 4-generation family history.  Significant diagnoses are listed below: Family History  Problem Relation Age of Onset   Breast cancer Mother 29   Diabetes Father    Brain cancer Father    Cancer Father 36       glioblastoma   Breast cancer Maternal Aunt        dx after 58   Melanoma Maternal Uncle        dx after 50, arm    Terri Hawkins is unaware of previous family history of genetic testing for hereditary cancer risks. There is no reported Ashkenazi Jewish ancestry. There is no known consanguinity.  GENETIC COUNSELING ASSESSMENT: Terri Hawkins is a 70 y.o. female with a personal and family history of cancer which is somewhat suggestive of a hereditary cancer syndrome and predisposition to cancer given the presence of related cancers at a young age in her family. We, therefore, discussed and recommended the following at today's visit.  DISCUSSION: We discussed that 5 - 10% of cancer is hereditary, with most cases of hereditary breast cancer associated with mutations in BRCA1/2.  There are other genes that can be associated with hereditary breast or melanoma cancer syndromes.  Type of cancer risk and level of risk are gene-specific. We discussed that testing is beneficial for several reasons including knowing how to follow individuals after completing their treatment, identifying whether potential treatment options would be beneficial, and understanding if other family members could be at risk for cancer and allowing them to undergo genetic testing.   We reviewed the characteristics, features and inheritance patterns of hereditary cancer syndromes. We also discussed genetic testing, including the appropriate family members to test, the process of testing, insurance coverage and turn-around-time for results. We discussed the  implications of a negative, positive and/or variant of uncertain significant result. In order to get genetic test results in a timely manner so that Terri Hawkins can use these genetic test results for surgical decisions, we recommended Terri Hawkins pursue genetic testing for the [place] BRCAPlus STAT Panel. The BRCAplus panel offered by [employer] and includes sequencing and deletion/duplication analysis for the following 8 genes: ATM, BRCA1, BRCA2, CDH1, CHEK2, PALB2, PTEN, and TP53.Once complete, we recommend Terri Hawkins pursue reflex genetic testing to a more comprehensive gene panel.   The CancerNext-Expanded gene panel offered by [facility] and includes sequencing, rearrangement, and RNA analysis for the following 77 genes: AIP, ALK, APC, ATM, AXIN2, BAP1, BARD1, BLM, BMPR1A, BRCA1, BRCA2, BRIP1, CDC73, CDH1, CDK4, CDKN1B, CDKN2A, CHEK2, CTNNA1, DICER1, FANCC, FH, FLCN, GALNT12, KIF1B, LZTR1, MAX, MEN1, MET, MLH1, MSH2, MSH3, MSH6, MUTYH, NBN, NF1, NF2, NTHL1, PALB2, PHOX2B, PMS2, POT1, PRKAR1A, PTCH1, PTEN, RAD51C, RAD51D, RB1, RECQL, RET, SDHA, SDHAF2, SDHB, SDHC, SDHD, SMAD4, SMARCA4, SMARCB1, SMARCE1, STK11, SUFU, TMEM127, TP53, TSC1, TSC2, VHL and XRCC2 (sequencing and deletion/duplication); EGFR, EGLN1, HOXB13, KIT, MITF, PDGFRA, POLD1, and POLE (sequencing only); EPCAM and GREM1 (deletion/duplication only).   Based on Terri Hawkins's personal and family history of cancer, she meets medical criteria for genetic testing. Despite that she meets criteria, she may still have an out of pocket cost. We discussed that if her out of pocket cost for testing is over $100, the laboratory should contact them to discuss self-pay prices, patient pay assistance programs, if applicable, and other billing options.   PLAN: After considering the risks, benefits, and limitations, Terri Hawkins provided informed consent to pursue genetic testing and the blood sample was sent to [facility] for analysis of  the [place] +RNAinsight Panel. Results should be available within approximately 1-2 weeks' time, at which point they will be disclosed by telephone to Terri Hawkins, as will any additional recommendations warranted by these results. Terri Hawkins will receive a summary of her genetic counseling visit and a copy of her results once available. This information will also be available in Epic.    Lastly, we encouraged Terri Hawkins to remain in contact with cancer genetics annually so that we can continuously update the family history and inform her of any changes in cancer genetics and testing that may be of benefit for this family.   Terri Hawkins questions were answered to her satisfaction today. Our contact information was provided should additional questions or concerns arise. Thank you for the referral and allowing [place] to share in the care of your patient.   Hannan Tetzlaff M. Joette Catching, [place], [facility] Genetic Counselor Amariyana [email or web address] (P) [phone]  The patient was seen for a total of 20  minutes in face-to-face genetic counseling.  The patient was seen alone.  Drs. Magrinat, Lindi Adie and/or Burr Medico were available to discuss this case as needed.  _______________________________________________________________________ For Office Staff:  Number of people involved in session: 1 Was an Intern/ student involved with case: no

## 2021-02-27 NOTE — Progress Notes (Signed)
[place] Work  Initial Assessment   Terri Hawkins is a 70 y.o. year old female presenting alone. Clinical Social Work was referred by [facility] for assessment of psychosocial needs.   SDOH (Social Determinants of Health) assessments performed: Yes SDOH Interventions    Flowsheet Row Most Recent Value  SDOH Interventions   Food Insecurity Interventions Intervention Not Indicated  Housing Interventions Intervention Not Indicated  Transportation Interventions Intervention Not Indicated       Distress Screen completed: Yes ONCBCN DISTRESS SCREENING 02/27/2021  Screening Type Initial Screening  Distress experienced in past week (1-10) 7  Physical Problem type Sleep/insomnia      Family/Social Information:  Housing Arrangement: patient lives with significant other, Terri Hawkins Family members/support persons in your life? Significant other, Family (daughter in [place], son in [place]) Transportation concerns: no  Employment: Retired. Income source: Paediatric nurse concerns: No Type of concern: None Food access concerns: no Services Currently in place:  n/a  Coping/ Adjustment to diagnosis: Patient understands treatment plan and what happens next? yes, feels better with more information but still has fears that the cancer has spread Concerns about diagnosis and/or treatment:  Afraid cancer has spread Patient reported stressors: Anxiety Hopes and priorities: hopes to go through treatment and be cancer-free Patient enjoys gardening and time with family/ friends (two grandkids ages 20 & 16) Current coping skills/ strengths: Capable of independent living, Motivation for treatment/growth, and Supportive family/friends    SUMMARY: Current SDOH Barriers:  No significant SDOH barriers at this time  Clinical Social Work Clinical Goal(s):  No clinical SW goals at this time  Interventions: Discussed common feeling and emotions when being diagnosed with cancer, and the  importance of support during treatment Informed patient of the support team roles and support services at [facility] Provided CSW contact information and encouraged patient to call with any questions or concerns   Follow Up Plan: Patient will contact CSW with any support or resource needs Patient verbalizes understanding of plan: Yes    Christeen Douglas LCSW

## 2021-02-27 NOTE — Progress Notes (Signed)
Radiation Oncology         [phone] ________________________________  Multidisciplinary Breast Oncology Clinic [facility]) Initial Outpatient Consultation  Name: Terri Hawkins MRN: [id number]  Date: 02/27/2021  DOB: 1950-10-30  [id number], Ander Gaster, MD (Inactive)  Rolm Bookbinder, MD   REFERRING PHYSICIAN: Rolm Bookbinder, MD  DIAGNOSIS: The encounter diagnosis was Malignant neoplasm of upper-outer quadrant of left breast in female, estrogen receptor positive ([place]).  Stage IB ( T2, Nx ) Left Breast UOQ, Invasive Ductal Carcinoma & DCIS, ER+ / PR+ / Her2-, Grade 2    ICD-10-CM   1. Malignant neoplasm of upper-outer quadrant of left breast in female, estrogen receptor positive ([place])  C50.412    Z17.0       HISTORY OF PRESENT ILLNESS::Terri Hawkins is a 70 y.o. female who is presenting to the office today for evaluation of her newly diagnosed breast cancer. She is accompanied by no one. She is doing well overall.   She had routine bilateral screening mammography on 01/16/21 showing a possible abnormality in the left breast. She underwent unilateral left breast diagnostic mammography with tomography and left breast ultrasonography at [facility] on 02/08/21 showing: a highly suspicious left breast mass at the 12 o'clock position corresponding to screening mammogram findings. Imaging also revealed two additional indeterminate left breast masses at the 2 o'clock position 6 cm and 7 cm from the nipple; one of which likely corresponds with an additional mammographic mass. No suspicious left axillary lymphadenopathy was visualized.  Biopsy on 02/19/21 showed: intraductal papilloma without atypia at 2 o'clock 6 cm from the nipple, DCIS arising in intraductal papilloma also at 2 o'clock 7 cm from the nipple, and invasive ductal carcinoma at the 12 o'clock position. The carcinoma appears to be grade 2; measuring 0.7 cm in the greatest linear extent. Prognostic indicators significant  for: estrogen receptor, 95% positive and progesterone receptor, 95% positive, both with strong staining intensity. Proliferation marker Ki67 at 5%. HER2 negative.  Menarche: 70 years old Age at first live birth: 70 years old GP: 2 LMP: age 89 Contraceptive: Yes, for a couple of years in her 83's HRT: None   The patient was referred today for presentation in the multidisciplinary conference.  Radiology studies and pathology slides were presented there for review and discussion of treatment options.  A consensus was discussed regarding potential next steps.  PREVIOUS RADIATION THERAPY: No  PAST MEDICAL HISTORY:  Past Medical History:  Diagnosis Date   Allergy 2016, I think   Yellow jacket sting   Breast cancer ([place]) 02/2021   Eczema    Family history of breast cancer 02/27/2021   Family history of glioblastoma 02/27/2021   Family history of melanoma 02/27/2021   Hyperlipidemia    Hypertension    Obesity    Pre-diabetes    Skin cancer Not sure of date   [facility]   Vitamin D deficiency     PAST SURGICAL HISTORY: Past Surgical History:  Procedure Laterality Date   CESAREAN SECTION     x3   CHOLECYSTECTOMY     TUBAL LIGATION     Not sure of date    FAMILY HISTORY:  Family History  Problem Relation Age of Onset   Breast cancer Mother 38   Diabetes Father    Brain cancer Father    Cancer Father 21       glioblastoma   Breast cancer Maternal Aunt        dx after 65   Melanoma Maternal  Uncle        dx after 31, arm    SOCIAL HISTORY:  Social History   Socioeconomic History   Marital status: Divorced    Spouse name: Not on file   Number of children: 2   Years of education: 12+   Highest education level: Not on file  Occupational History   Not on file  Tobacco Use   Smoking status: Never   Smokeless tobacco: Never  Substance and Sexual Activity   Alcohol use: Yes    Alcohol/week: 1.0 standard drink    Types: 1 Glasses of wine per week     Comment: Some weeks 0 wine   Drug use: No   Sexual activity: Not Currently    Birth control/protection: Diaphragm, I.U.D., Pill, Post-menopausal, Other-see comments    Comment: Tubal ligation  Other Topics Concern   Not on file  Social History Narrative   Not on file   Social Determinants of Health   Financial Resource Strain: Not on file  Food Insecurity: No Food Insecurity   Worried About Charity fundraiser in the Last Year: Never true   Ran Out of Food in the Last Year: Never true  Transportation Needs: No Transportation Needs   Lack of Transportation (Medical): No   Lack of Transportation (Non-Medical): No  Physical Activity: Not on file  Stress: Not on file  Social Connections: Not on file    ALLERGIES:  Allergies  Allergen Reactions   Zocor [Simvastatin]     Leg pain   Wasp Venom Protein Itching   Yellow Jacket Venom [Bee Venom]     MEDICATIONS:  Current Outpatient Medications  Medication Sig Dispense Refill   amLODipine (NORVASC) 5 MG tablet Take 1 tablet (5 mg total) by mouth daily. 90 tablet 0   anastrozole (ARIMIDEX) 1 MG tablet Take 1 tablet (1 mg total) by mouth daily. 30 tablet 3   Cholecalciferol 25 MCG (1000 UT) CHEW Vitamin D3 25 mcg (1,000 unit) chewable tablet  Take 1 tablet every day by oral route.     Multiple Vitamin (MULTIVITAMIN) capsule Take 1 capsule by mouth daily.     No current facility-administered medications for this encounter.    REVIEW OF SYSTEMS: A 10+ POINT REVIEW OF SYSTEMS WAS OBTAINED including neurology, dermatology, psychiatry, cardiac, respiratory, lymph, extremities, GI, GU, musculoskeletal, constitutional, reproductive, HEENT. On the provided form, she reports weight changes, loss of sleep, wearing glasses, anxiety, and a palpable breast lump. She denies any other symptoms.    PHYSICAL EXAM:     Vitals with BMI 02/27/2021  Height 5' 4.25"  Weight 191 lbs  BMI 15.40  Systolic 086  Diastolic 66  Pulse 72   Lungs are  clear to auscultation bilaterally. Heart has regular rate and rhythm. No palpable cervical, supraclavicular, or axillary adenopathy. Abdomen soft, non-tender, normal bowel sounds. Breast: Right breast with no palpable mass, nipple discharge, or bleeding. Left breast with extensive bruising in the upper and lateral aspect of the breast. The patient has a palpable mass in the 12 o'clock position, estimated to be approximately 3 and half by 3 cm in size.   KPS = 90  100 - Normal; no complaints; no evidence of disease. 90   - Able to carry on normal activity; minor signs or symptoms of disease. 80   - Normal activity with effort; some signs or symptoms of disease. 20   - Cares for self; unable to carry on normal activity or to do active  work. 60   - Requires occasional assistance, but is able to care for most of his personal needs. 50   - Requires considerable assistance and frequent medical care. 65   - Disabled; requires special care and assistance. 56   - Severely disabled; hospital admission is indicated although death not imminent. 4   - Very sick; hospital admission necessary; active supportive treatment necessary. 10   - Moribund; fatal processes progressing rapidly. 0     - Dead  Karnofsky DA, Abelmann [place], Craver LS and Burchenal [facility] [phone]) The use of the nitrogen mustards in the palliative treatment of carcinoma: with particular reference to bronchogenic carcinoma Cancer 1 634-56  LABORATORY DATA:  Lab Results  Component Value Date   WBC 7.3 02/27/2021   HGB 12.9 02/27/2021   HCT 39.7 02/27/2021   MCV 91.3 02/27/2021   PLT 247 02/27/2021   Lab Results  Component Value Date   NA 140 02/27/2021   K 4.3 02/27/2021   CL 105 02/27/2021   CO2 26 02/27/2021   Lab Results  Component Value Date   ALT 24 02/27/2021   AST 19 02/27/2021   ALKPHOS 80 02/27/2021   BILITOT 0.6 02/27/2021    PULMONARY FUNCTION TEST:   Recent Review Flowsheet Data   There is no flowsheet data to  display.     RADIOGRAPHY: US BREAST LTD UNI LEFT INC AXILLA  Result Date: 02/08/2021 CLINICAL DATA:  70 year old female recalled from screening mammogram dated 01/16/2021 for a possible left breast mass. EXAM: DIGITAL DIAGNOSTIC UNILATERAL LEFT MAMMOGRAM WITH TOMOSYNTHESIS AND CAD; ULTRASOUND LEFT BREAST LIMITED TECHNIQUE: Left digital diagnostic mammography and breast tomosynthesis was performed. The images were evaluated with computer-aided detection.; Targeted ultrasound examination of the left breast was performed COMPARISON:  Previous exam(s). ACR Breast Density Category b: There are scattered areas of fibroglandular density. FINDINGS: There is a persistent irregular hyperdense mass in the superior central left breast at mid depth. An additional similar appearing mass is identified in the upper outer quadrant at mid to posterior depth. Further evaluation with ultrasound was performed. Targeted ultrasound is performed, showing an irregular hypoechoic mass with associated vascularity at the 12 o'clock position 4 cm from the nipple. It measures 2.6 x 2.1 x 1.5 cm. This correlates with the mammographic finding. Two additional smaller but similar appearing masses are identified at the 2 o'clock position 6 cm from the nipple in the 2 o'clock position 7 cm from the nipple. They measure 6 x 6 x 3 mm and 6 x 6 x 7 mm respectively. One of these likely corresponds with the additional mammographically identified mass. Evaluation of the left axilla demonstrates no suspicious lymphadenopathy. IMPRESSION: 1. Highly suspicious left breast mass at the 12 o'clock position corresponding with the screening mammographic findings recommendation is for ultrasound-guided biopsy. 2. Two additional indeterminate left breast masses at the 2 o'clock position 6 cm and 7 cm from the nipple. One of these likely corresponds with an additional mammographic mass. Recommendation is for ultrasound-guided biopsy. 3. No suspicious left  axillary lymphadenopathy. RECOMMENDATION: Three area ultrasound-guided biopsy of the left breast. I have discussed the findings and recommendations with the patient. If applicable, a reminder letter will be sent to the patient regarding the next appointment. BI-RADS CATEGORY  5: Highly suggestive of malignancy. Electronically Signed   By: Kristopher Oppenheim M.D.   On: 02/08/2021 14:35  MM DIAG BREAST TOMO UNI LEFT  Result Date: 02/08/2021 CLINICAL DATA:  70 year old female recalled from screening mammogram dated  01/16/2021 for a possible left breast mass. EXAM: DIGITAL DIAGNOSTIC UNILATERAL LEFT MAMMOGRAM WITH TOMOSYNTHESIS AND CAD; ULTRASOUND LEFT BREAST LIMITED TECHNIQUE: Left digital diagnostic mammography and breast tomosynthesis was performed. The images were evaluated with computer-aided detection.; Targeted ultrasound examination of the left breast was performed COMPARISON:  Previous exam(s). ACR Breast Density Category b: There are scattered areas of fibroglandular density. FINDINGS: There is a persistent irregular hyperdense mass in the superior central left breast at mid depth. An additional similar appearing mass is identified in the upper outer quadrant at mid to posterior depth. Further evaluation with ultrasound was performed. Targeted ultrasound is performed, showing an irregular hypoechoic mass with associated vascularity at the 12 o'clock position 4 cm from the nipple. It measures 2.6 x 2.1 x 1.5 cm. This correlates with the mammographic finding. Two additional smaller but similar appearing masses are identified at the 2 o'clock position 6 cm from the nipple in the 2 o'clock position 7 cm from the nipple. They measure 6 x 6 x 3 mm and 6 x 6 x 7 mm respectively. One of these likely corresponds with the additional mammographically identified mass. Evaluation of the left axilla demonstrates no suspicious lymphadenopathy. IMPRESSION: 1. Highly suspicious left breast mass at the 12 o'clock position  corresponding with the screening mammographic findings recommendation is for ultrasound-guided biopsy. 2. Two additional indeterminate left breast masses at the 2 o'clock position 6 cm and 7 cm from the nipple. One of these likely corresponds with an additional mammographic mass. Recommendation is for ultrasound-guided biopsy. 3. No suspicious left axillary lymphadenopathy. RECOMMENDATION: Three area ultrasound-guided biopsy of the left breast. I have discussed the findings and recommendations with the patient. If applicable, a reminder letter will be sent to the patient regarding the next appointment. BI-RADS CATEGORY  5: Highly suggestive of malignancy. Electronically Signed   By: Kristopher Oppenheim M.D.   On: 02/08/2021 14:35  MM CLIP PLACEMENT LEFT  Result Date: 02/19/2021 CLINICAL DATA:  Patient status post ultrasound-guided core needle biopsy 3 left breast masses. EXAM: 3D DIAGNOSTIC LEFT MAMMOGRAM POST ULTRASOUND BIOPSY COMPARISON:  Previous exam(s). FINDINGS: Site 1: Left breast mass 2 o'clock position 6 cm from nipple: Ribbon shaped clip: In appropriate position Site 2: Left breast mass 2 o'clock position 7 cm from nipple: Coil clip: In appropriate position Site 3: Left breast mass 12 o'clock position: Heart clip: In appropriate position IMPRESSION: Appropriate positioning of the biopsy marking clips as above. Final Assessment: Post Procedure Mammograms for Marker Placement Electronically Signed   By: Lovey Newcomer M.D.   On: 02/19/2021 11:48  [place] LT BREAST BX W LOC DEV 1ST LESION IMG BX SPEC US GUIDE  Addendum Date: 02/22/2021   ADDENDUM REPORT: 02/21/2021 15:00 ADDENDUM: Pathology revealed INTRADUCTAL PAPILLOMA WITHOUT ATYPIA of the LEFT breast, 2 o'clock 6 cmfn (ribbon clip). This was found to be concordant by Dr. Lovey Newcomer. Pathology revealed DUCTAL CARCINOMA IN-SITU ARISING IN INTRADUCTAL PAPILLOMA of the LEFT breast, 2 o'clock 7 cmfn (coil clip). This was found to be concordant by Dr. Lovey Newcomer.  Pathology revealed GRADE II INVASIVE DUCTAL CARCINOMA of the LEFT breast, 12 o'clock (heart clip). This was found to be concordant by Dr. Lovey Newcomer. Pathology results were discussed with the patient by telephone. The patient reported doing well after the biopsies with tenderness at the sites. Post biopsy instructions and care were reviewed and questions were answered. The patient was encouraged to call [facility] for any additional concerns. My direct phone  number was provided. The patient was referred to [facility] on February 27, 2021. Recommendation for a bilateral breast MRI to exclude any additional sites of disease. Pathology results reported by Terie Purser, RN on 02/21/2021. Electronically Signed   By: Lovey Newcomer M.D.   On: 02/21/2021 15:00   Result Date: 02/22/2021 CLINICAL DATA:  Patient with 3 suspicious left breast masses. EXAM: ULTRASOUND GUIDED LEFT BREAST CORE NEEDLE BIOPSY COMPARISON:  Previous exam(s). PROCEDURE: I met with the patient and we discussed the procedure of ultrasound-guided biopsy, including benefits and alternatives. We discussed the high likelihood of a successful procedure. We discussed the risks of the procedure, including infection, bleeding, tissue injury, clip migration, and inadequate sampling. Informed written consent was given. The usual time-out protocol was performed immediately prior to the procedure. Site 1: Left breast mass 2 o'clock position 6 cm from nipple: Ribbon shaped clip Lesion quadrant: Upper outer quadrant Using sterile technique and 1% Lidocaine as local anesthetic, under direct ultrasound visualization, a 14 gauge spring-loaded device was used to perform biopsy of left breast mass 2 o'clock position 6 cm from nipple using a medial approach. At the conclusion of the procedure ribbon shaped tissue marker clip was deployed into the biopsy cavity. Follow up 2  view mammogram was performed and dictated separately. Site 2: Left breast mass 2 o'clock position 7 cm from nipple: Coil clip Lesion quadrant: Upper outer quadrant Using sterile technique and 1% Lidocaine as local anesthetic, under direct ultrasound visualization, a 14 gauge spring-loaded device was used to perform biopsy of left breast mass 2 o'clock position 7 cm from the nipple using a medial approach. At the conclusion of the procedure coil tissue marker clip was deployed into the biopsy cavity. Follow up 2 view mammogram was performed and dictated separately. Site 3: Left breast mass 12 o'clock position: Heart clip Lesion quadrant: Upper outer quadrant Using sterile technique and 1% Lidocaine as local anesthetic, under direct ultrasound visualization, a 14 gauge spring-loaded device was used to perform biopsy of left breast mass 12 o'clock position using a medial approach. At the conclusion of the procedure heart shaped tissue marker clip was deployed into the biopsy cavity. Follow up 2 view mammogram was performed and dictated separately. IMPRESSION: Ultrasound guided biopsy of left breast masses as above. No apparent complications. Electronically Signed: By: Lovey Newcomer M.D. On: 02/19/2021 11:43  [place] LT BREAST BX W LOC DEV EA ADD LESION IMG BX SPEC US GUIDE  Addendum Date: 02/22/2021   ADDENDUM REPORT: 02/21/2021 15:00 ADDENDUM: Pathology revealed INTRADUCTAL PAPILLOMA WITHOUT ATYPIA of the LEFT breast, 2 o'clock 6 cmfn (ribbon clip). This was found to be concordant by Dr. Lovey Newcomer. Pathology revealed DUCTAL CARCINOMA IN-SITU ARISING IN INTRADUCTAL PAPILLOMA of the LEFT breast, 2 o'clock 7 cmfn (coil clip). This was found to be concordant by Dr. Lovey Newcomer. Pathology revealed GRADE II INVASIVE DUCTAL CARCINOMA of the LEFT breast, 12 o'clock (heart clip). This was found to be concordant by Dr. Lovey Newcomer. Pathology results were discussed with the patient by telephone. The patient reported doing well after  the biopsies with tenderness at the sites. Post biopsy instructions and care were reviewed and questions were answered. The patient was encouraged to call [facility] for any additional concerns. My direct phone number was provided. The patient was referred to [facility] at [address] [facility] on February 27, 2021.  Recommendation for a bilateral breast MRI to exclude any additional sites of disease. Pathology results reported by Terie Purser, RN on 02/21/2021. Electronically Signed   By: Lovey Newcomer M.D.   On: 02/21/2021 15:00   Result Date: 02/22/2021 CLINICAL DATA:  Patient with 3 suspicious left breast masses. EXAM: ULTRASOUND GUIDED LEFT BREAST CORE NEEDLE BIOPSY COMPARISON:  Previous exam(s). PROCEDURE: I met with the patient and we discussed the procedure of ultrasound-guided biopsy, including benefits and alternatives. We discussed the high likelihood of a successful procedure. We discussed the risks of the procedure, including infection, bleeding, tissue injury, clip migration, and inadequate sampling. Informed written consent was given. The usual time-out protocol was performed immediately prior to the procedure. Site 1: Left breast mass 2 o'clock position 6 cm from nipple: Ribbon shaped clip Lesion quadrant: Upper outer quadrant Using sterile technique and 1% Lidocaine as local anesthetic, under direct ultrasound visualization, a 14 gauge spring-loaded device was used to perform biopsy of left breast mass 2 o'clock position 6 cm from nipple using a medial approach. At the conclusion of the procedure ribbon shaped tissue marker clip was deployed into the biopsy cavity. Follow up 2 view mammogram was performed and dictated separately. Site 2: Left breast mass 2 o'clock position 7 cm from nipple: Coil clip Lesion quadrant: Upper outer quadrant Using sterile technique and 1% Lidocaine as local anesthetic, under direct ultrasound  visualization, a 14 gauge spring-loaded device was used to perform biopsy of left breast mass 2 o'clock position 7 cm from the nipple using a medial approach. At the conclusion of the procedure coil tissue marker clip was deployed into the biopsy cavity. Follow up 2 view mammogram was performed and dictated separately. Site 3: Left breast mass 12 o'clock position: Heart clip Lesion quadrant: Upper outer quadrant Using sterile technique and 1% Lidocaine as local anesthetic, under direct ultrasound visualization, a 14 gauge spring-loaded device was used to perform biopsy of left breast mass 12 o'clock position using a medial approach. At the conclusion of the procedure heart shaped tissue marker clip was deployed into the biopsy cavity. Follow up 2 view mammogram was performed and dictated separately. IMPRESSION: Ultrasound guided biopsy of left breast masses as above. No apparent complications. Electronically Signed: By: Lovey Newcomer M.D. On: 02/19/2021 11:43  [place] LT BREAST BX W LOC DEV EA ADD LESION IMG BX SPEC US GUIDE  Addendum Date: 02/22/2021   ADDENDUM REPORT: 02/21/2021 15:00 ADDENDUM: Pathology revealed INTRADUCTAL PAPILLOMA WITHOUT ATYPIA of the LEFT breast, 2 o'clock 6 cmfn (ribbon clip). This was found to be concordant by Dr. Lovey Newcomer. Pathology revealed DUCTAL CARCINOMA IN-SITU ARISING IN INTRADUCTAL PAPILLOMA of the LEFT breast, 2 o'clock 7 cmfn (coil clip). This was found to be concordant by Dr. Lovey Newcomer. Pathology revealed GRADE II INVASIVE DUCTAL CARCINOMA of the LEFT breast, 12 o'clock (heart clip). This was found to be concordant by Dr. Lovey Newcomer. Pathology results were discussed with the patient by telephone. The patient reported doing well after the biopsies with tenderness at the sites. Post biopsy instructions and care were reviewed and questions were answered. The patient was encouraged to call [facility] for any additional concerns. My direct phone number  was provided. The patient was referred to [facility] on February 27, 2021. Recommendation for a bilateral breast MRI to exclude any additional sites of disease. Pathology results reported by Terie Purser, RN on 02/21/2021. Electronically Signed  By: Lovey Newcomer M.D.   On: 02/21/2021 15:00   Result Date: 02/22/2021 CLINICAL DATA:  Patient with 3 suspicious left breast masses. EXAM: ULTRASOUND GUIDED LEFT BREAST CORE NEEDLE BIOPSY COMPARISON:  Previous exam(s). PROCEDURE: I met with the patient and we discussed the procedure of ultrasound-guided biopsy, including benefits and alternatives. We discussed the high likelihood of a successful procedure. We discussed the risks of the procedure, including infection, bleeding, tissue injury, clip migration, and inadequate sampling. Informed written consent was given. The usual time-out protocol was performed immediately prior to the procedure. Site 1: Left breast mass 2 o'clock position 6 cm from nipple: Ribbon shaped clip Lesion quadrant: Upper outer quadrant Using sterile technique and 1% Lidocaine as local anesthetic, under direct ultrasound visualization, a 14 gauge spring-loaded device was used to perform biopsy of left breast mass 2 o'clock position 6 cm from nipple using a medial approach. At the conclusion of the procedure ribbon shaped tissue marker clip was deployed into the biopsy cavity. Follow up 2 view mammogram was performed and dictated separately. Site 2: Left breast mass 2 o'clock position 7 cm from nipple: Coil clip Lesion quadrant: Upper outer quadrant Using sterile technique and 1% Lidocaine as local anesthetic, under direct ultrasound visualization, a 14 gauge spring-loaded device was used to perform biopsy of left breast mass 2 o'clock position 7 cm from the nipple using a medial approach. At the conclusion of the procedure coil tissue marker clip was deployed into the biopsy  cavity. Follow up 2 view mammogram was performed and dictated separately. Site 3: Left breast mass 12 o'clock position: Heart clip Lesion quadrant: Upper outer quadrant Using sterile technique and 1% Lidocaine as local anesthetic, under direct ultrasound visualization, a 14 gauge spring-loaded device was used to perform biopsy of left breast mass 12 o'clock position using a medial approach. At the conclusion of the procedure heart shaped tissue marker clip was deployed into the biopsy cavity. Follow up 2 view mammogram was performed and dictated separately. IMPRESSION: Ultrasound guided biopsy of left breast masses as above. No apparent complications. Electronically Signed: By: Lovey Newcomer M.D. On: 02/19/2021 11:43     IMPRESSION: Stage IB ( T2, Nx ) Left Breast UOQ, Invasive Ductal Carcinoma & DCIS, ER+ / PR+ / Her2-, Grade 2   Patient may be a good candidate for breast conservation with radiotherapy to left breast. She has three areas that will need to be removed and one of the areas is quite large on exam today. She would like to attempt breast conserving surgery. The patient will have an MRI to see if she may be a candidate for breast conserving surgery. We discussed the general course of radiation, potential side effects, and toxicities with radiation and the patient is interested in this approach.    PLAN:  Genetics  Left Mastectomy with sentinel lymph node (depending on MRI findings) Oncotype testing Radiation therapy either as breast conserving treatment or post-mastectomy depending on final pathologic results Aromatase Inhibitor starting on  07/20   ------------------------------------------------  Blair Promise, PhD, MD  This document serves as a record of services personally performed by Gery Pray, MD. It was created on his behalf by Roney Mans, a trained medical scribe. The creation of this record is based on the scribe's personal observations and the provider's statements to  them. This document has been checked and approved by the attending provider.

## 2021-03-04 ENCOUNTER — Ambulatory Visit
Admission: RE | Admit: 2021-03-04 | Discharge: 2021-03-04 | Disposition: A | Discharge status: Home / Self Care | Payer: Medicare Other | Source: Ambulatory Visit | Attending: General Surgery | Admitting: General Surgery

## 2021-03-04 DIAGNOSIS — Z170 Estrogen receptor positive status [ER+]: Secondary | ICD-10-CM

## 2021-03-04 MED ORDER — GADOBUTROL 1 MMOL/ML IV SOLN
9.0000 mL | Freq: Once | INTRAVENOUS | Status: AC | PRN
  Administered 2021-03-04: 9 mL via INTRAVENOUS

## 2021-03-05 ENCOUNTER — Telehealth: Payer: Self-pay | Admitting: *Deleted

## 2021-03-05 ENCOUNTER — Encounter: Payer: Self-pay | Admitting: *Deleted

## 2021-03-05 NOTE — Telephone Encounter (Signed)
Spoke with patient to follow up from [facility] 7/20 and assess navigation needs. Patient denies any questions or concerns at this time.

## 2021-03-07 ENCOUNTER — Telehealth: Payer: Self-pay | Admitting: *Deleted

## 2021-03-07 NOTE — Telephone Encounter (Signed)
Received call from patient stating she has problems/history with eczema on her feet.  She states she has only been on the anastrozole  1 week but has some increase itching on her feet but is not sure if its the medication or just her ordinary eczema.  Informed she could stop the anastrozole and see if this gets better but to also make an appointment with her dermatologist/PCP to evaluate. She will call us back and let us know how she is doing.

## 2021-03-08 ENCOUNTER — Other Ambulatory Visit: Payer: Self-pay | Admitting: General Surgery

## 2021-03-08 DIAGNOSIS — Z170 Estrogen receptor positive status [ER+]: Secondary | ICD-10-CM

## 2021-03-11 ENCOUNTER — Encounter: Payer: Self-pay | Admitting: *Deleted

## 2021-03-12 ENCOUNTER — Telehealth: Payer: Self-pay | Admitting: Genetic Counselor

## 2021-03-12 ENCOUNTER — Ambulatory Visit: Payer: Medicare Other | Attending: General Surgery

## 2021-03-12 ENCOUNTER — Encounter: Payer: Self-pay | Admitting: Genetic Counselor

## 2021-03-12 ENCOUNTER — Other Ambulatory Visit: Payer: Self-pay

## 2021-03-12 DIAGNOSIS — C50412 Malignant neoplasm of upper-outer quadrant of left female breast: Secondary | ICD-10-CM | POA: Diagnosis present

## 2021-03-12 DIAGNOSIS — Z170 Estrogen receptor positive status [ER+]: Secondary | ICD-10-CM | POA: Insufficient documentation

## 2021-03-12 DIAGNOSIS — Z1379 Encounter for other screening for genetic and chromosomal anomalies: Secondary | ICD-10-CM | POA: Insufficient documentation

## 2021-03-12 DIAGNOSIS — R293 Abnormal posture: Secondary | ICD-10-CM | POA: Diagnosis present

## 2021-03-12 NOTE — Therapy (Signed)
[place], [place], [address] Phone: [phone]   Fax:  [phone]  Physical Therapy Evaluation  Patient Details  Name: Terri Hawkins MRN: [id number] Date of Birth: 06/05/1951 Referring Provider (PT): Dr. Donne Hazel   Encounter Date: 03/12/2021   PT End of Session - 03/12/21 1152     Visit Number 1    Number of Visits 2    Date for PT Re-Evaluation 05/07/21    PT Start Time 1050    PT Stop Time 4536    PT Time Calculation (min) 55 min    Activity Tolerance Patient tolerated treatment well    Behavior During Therapy [facility] for tasks assessed/performed             Past Medical History:  Diagnosis Date   Allergy 2016, I think   Yellow jacket sting   Breast cancer ([place]) 02/2021   Eczema    Family history of breast cancer 02/27/2021   Family history of glioblastoma 02/27/2021   Family history of melanoma 02/27/2021   Hyperlipidemia    Hypertension    Obesity    Pre-diabetes    Skin cancer Not sure of date   [facility]   Vitamin D deficiency     Past Surgical History:  Procedure Laterality Date   CESAREAN SECTION     x3   CHOLECYSTECTOMY     TUBAL LIGATION     Not sure of date    There were no vitals filed for this visit.    Subjective Assessment - 03/12/21 1052     Subjective Pt is here for pre-surgical screening. She is scheduled for left mastectomy with SLNB on 03/27/2021    Pertinent History Suspicious area found with screening mammogram.  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  She started neoadjuvant Anastrazole, but started having some sideeffects so is to discontinue for a week. Left mastectomy with SLNB scheduled for 03/27/2021    Patient Stated Goals gain information from providers.    Currently in Pain? No/denies    Pain Score 0-No pain                [facility] PT Assessment - 03/12/21  0001       Assessment   Medical Diagnosis Left breast Cancer    Referring Provider (PT) Dr. Donne Hazel    Onset Date/Surgical Date 03/27/21   Biopsy 02/19/2021 confirmed   Hand Dominance Left    Prior Therapy no      Precautions   Precaution Comments lymphedema precaution after surgery      Restrictions   Weight Bearing Restrictions No      Balance Screen   Has the patient fallen in the past 6 months No    Has the patient had a decrease in activity level because of a fear of falling?  No    Is the patient reluctant to leave their home because of a fear of falling?  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[employer], trying to increase walking      Cognition   Overall Cognitive Status Within Functional Limits for  tasks assessed      Observation/Other Assessments   Skin Integrity intact      Posture/Postural Control   Posture/Postural Control Postural limitations    Postural Limitations Rounded Shoulders;Forward head      AROM   Right Shoulder Extension 55 Degrees    Right Shoulder Flexion 148 Degrees    Right Shoulder ABduction 162 Degrees    Right Shoulder Internal Rotation 68 Degrees    Right Shoulder External Rotation 95 Degrees    Left Shoulder Extension 58 Degrees    Left Shoulder Flexion 148 Degrees    Left Shoulder ABduction 158 Degrees    Left Shoulder Internal Rotation 68 Degrees    Left Shoulder External Rotation 93 Degrees      Strength   Overall Strength Comments WFL               LYMPHEDEMA/ONCOLOGY QUESTIONNAIRE - 03/12/21 0001       Type   Cancer Type Left IDC Breast Cancer      Surgeries   Mastectomy Date 03/27/21    Sentinel Lymph Node Biopsy Date 03/27/21      Treatment   Active Chemotherapy  Treatment No    Past Chemotherapy Treatment No    Active Radiation Treatment No    Past Radiation Treatment No    Current Hormone Treatment Yes    Past Hormone Therapy No      What other symptoms do you have   Are you Having Heaviness or Tightness No    Are you having Pain No    Are you having pitting edema No    Is it Hard or Difficult finding clothes that fit No    Do you have infections No      Right Upper Extremity Lymphedema   10 cm Proximal to Olecranon Process 34.1 cm    Olecranon Process 30.1 cm    10 cm Proximal to Ulnar Styloid Process 26.7 cm    Just Proximal to Ulnar Styloid Process 16.4 cm    Across Hand at [employer] 20.3 cm    At [place] of 2nd Digit 7.3 cm      Left Upper Extremity Lymphedema   10 cm Proximal to Olecranon Process 35 cm    Olecranon Process 29.6 cm    10 cm Proximal to Ulnar Styloid Process 25.5 cm    Just Proximal to Ulnar Styloid Process 16.8 cm    Across Hand at [employer] 20.6 cm    At [place] of 2nd Digit 7.2 cm             L-DEX FLOWSHEETS - 03/12/21 1100       L-DEX LYMPHEDEMA SCREENING   Measurement Type Unilateral    L-DEX MEASUREMENT EXTREMITY Upper Extremity    POSITION  Standing    DOMINANT SIDE Left    At Risk Side Left    BASELINE SCORE (UNILATERAL) -6.1                    Objective measurements completed on examination: See above findings.               PT Education - 03/12/21 1151     Education Details 4 post op exercises, lymphedema risk/skin care, ABC class, SOZO    Person(s) Educated Patient    Methods Explanation;Handout    Comprehension Verbalized understanding;Returned demonstration                 PT Long Term  Goals - 03/12/21 1203       PT LONG TERM GOAL #1   Title Pt will have bilateral shoulder ROM returned to WNL    Time 8    Period Weeks    Status New    Target Date 05/07/21             Breast Clinic Goals - 03/12/21 1202       Patient will be able  to verbalize understanding of pertinent lymphedema risk reduction practices relevant to her diagnosis specifically related to skin care.   Time 1    Period Days    Status Achieved    Target Date 03/12/21      Patient will be able to return demonstrate and/or verbalize understanding of the post-op home exercise program related to regaining shoulder range of motion.   Time 1    Period Days    Status Achieved    Target Date 03/12/21      Patient will be able to verbalize understanding of the importance of attending the postoperative After Breast Cancer Class for further lymphedema risk reduction education and therapeutic exercise.   Time 1    Period Days    Status Achieved    Target Date 03/12/21                   Plan - 03/12/21 1158     Clinical Impression Statement pt was seen for pre-surgical screening. She was instructed in 4 post op exercises, ABC class, SOZO, and skin care/precautions for lymphedema.  She was set up for all follow up appointments including Re-eval, ABC class, and SOZO screen. She was advised to call if she had any questions or concerns    Personal Factors and Comorbidities Comorbidity 1    Comorbidities IDC  pending mastectomy and SLNB    Stability/Clinical Decision Making Stable/Uncomplicated    Rehab Potential Excellent    PT Frequency 1x / week   to be performed after mastectomy for reassessment   PT Duration 8 weeks    PT Treatment/Interventions ADLs/Self Care Home Management;Therapeutic exercise;Patient/family education    PT Next Visit Plan Reassess after surgery    Recommended Other Services ABC, SOZO screens every 3 months after surgery    Consulted and Agree with Plan of Care Patient           Patient will follow up at outpatient cancer rehab 3-4 weeks following surgery.  If the patient requires physical therapy at that time, a specific plan will be dictated and sent to the referring physician for approval. The patient was educated today  on appropriate basic range of motion exercises to begin post operatively and the importance of attending the After Breast Cancer class following surgery.  Patient was educated today on lymphedema risk reduction practices as it pertains to recommendations that will benefit the patient immediately following surgery.  She verbalized good understanding.    The patient was assessed using the L-Dex machine today to produce a lymphedema index baseline score. The patient will be reassessed on a regular basis (typically every 3 months) to obtain new L-Dex scores. If the score is > 6.5 points away from his/her baseline score indicating onset of subclinical lymphedema, it will be recommended to wear a compression garment for 4 weeks, 12 hours per day and then be reassessed. If the score continues to be > 6.5 points from baseline at reassessment, we will initiate lymphedema treatment. Assessing in this manner has a 95% rate of  preventing clinically significant lymphedema.   Patient will benefit from skilled therapeutic intervention in order to improve the following deficits and impairments:  Decreased knowledge of precautions, Postural dysfunction  Visit Diagnosis: Malignant neoplasm of upper-outer quadrant of left breast in female, estrogen receptor positive ([place])  Abnormal posture     Problem List Patient Active Problem List   Diagnosis Date Noted   Genetic testing 03/12/2021   Family history of breast cancer 02/27/2021   Family history of melanoma 02/27/2021   Family history of glioblastoma 02/27/2021   Malignant neoplasm of upper-outer quadrant of left breast in female, estrogen receptor positive ([place]) 02/22/2021   Essential hypertension, benign 01/24/2011   Hyperlipidemia 01/24/2011   Unspecified vitamin D deficiency 01/24/2011    Terri Hawkins 03/12/2021, 12:10 PM  [place], [place], [address] Phone:  [phone]   Fax:  [phone]  Name: Terri Hawkins MRN: [id number] Date of Birth: [birth year] Cheral Almas, PT 03/12/21 12:12 PM

## 2021-03-12 NOTE — Telephone Encounter (Signed)
Called Ms. Noyes to discuss results of [place] BRCAPlus Panel.  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  Discussed with Ms. 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  Discussed that this variant if under review for reclassification at [facility].  Will call to update with recommendations/family implications as more information is provided from [facility] in the next 1-2 weeks.

## 2021-03-12 NOTE — Patient Instructions (Signed)
Physical Therapy Information for After Breast Cancer Surgery/Treatment:  Lymphedema is a swelling condition that you may be at risk for in your arm if you have lymph nodes removed from the armpit area.  After a sentinel node biopsy, the risk is approximately 5-9% and is higher after an axillary node dissection.  There is treatment available for this condition and it is not life-threatening.  Contact your physician or physical therapist with concerns. You may begin the 4 shoulder/posture exercises (see additional sheet) when permitted by your physician (typically a week after surgery).  If you have drains, you may need to wait until those are removed before beginning range of motion exercises.  A general recommendation is to not lift your arms above shoulder height until drains are removed.  These exercises should be done to your tolerance and gently.  This is not a "no pain/no gain" type of recovery so listen to your body and stretch into the range of motion that you can tolerate, stopping if you have pain.  If you are having immediate reconstruction, ask your plastic surgeon about doing exercises as he or she may want you to wait. We encourage you to attend the free one time ABC (After Breast Cancer) class offered by [facility].  You will learn information related to lymphedema risk, prevention and treatment and additional exercises to regain mobility following surgery.  You can call [phone] for more information.  This is offered the 1st and 3rd Monday of each month.  You only attend the class one time. While undergoing any medical procedure or treatment, try to avoid blood pressure being taken or needle sticks from occurring on the arm on the side of cancer.   This recommendation begins after surgery and continues for the rest of your life.  This may help reduce your risk of getting lymphedema (swelling in your arm). An excellent resource for those seeking information on  lymphedema is the National Lymphedema Network's web site. It can be accessed at [place].org If you notice swelling in your hand, arm or breast at any time following surgery (even if it is many years from now), please contact your doctor or physical therapist to discuss this.  Lymphedema can be treated at any time but it is easier for you if it is treated early on.  If you feel like your shoulder motion is not returning to normal in a reasonable amount of time, please contact your surgeon or physical therapist.  [facility] [place], [place], [place] [id number]; [address] [place], [place] [address] ABC CLASS After Breast Cancer Class  After Breast Cancer Class is a specially designed exercise class to assist you in a safe recover after having breast cancer surgery.  In this class you will learn how to get back to full function whether your drains were just removed or if you had surgery a month ago.  This one-time class is held the 1st and 3rd Monday of every month from 11:00 a.m. until 12:00 noon virtually. This class is FREE and space is limited. For more information or to register for the next available class, call [phone].  Class Goals  Understand specific stretches to improve the flexibility of you chest and shoulder. Learn ways to safely strengthen your upper body and improve your posture. Understand the warning signs of infection and why you may be at risk for an arm infection. Learn about Lymphedema and prevention.  ** You do not attend this class until after  surgery.  Drains must be removed to participate  Patient was instructed today in a home exercise program today for post op shoulder range of motion. These included active assist shoulder flexion in sitting/supine, scapular retraction, wall walking with shoulder abduction, and hands behind head external rotation in sitting or supine.  She was encouraged to do these twice a day, holding 3 seconds and repeating 5 times when permitted  by her physician and after drains are removed

## 2021-03-12 NOTE — Progress Notes (Signed)
Patient ID: Terri Hawkins, female    DOB: January 19, 1951  MRN: [id number]  CC: Eczema  Subjective: Terri Hawkins is a 70 y.o. female who presents for eczema.   Her concerns today include:   ECZEMA: Bilateral plantar aspect of feet with eczema. Per Oncology was told to hold Anastrozole as this may be causing eczema to worsen. Did see improvement with holding Anastrozole but not completely resolved. Using over-the-counter Hydrocortisone and wet compresses to help. Endorses pain sometimes especially when the skin is cracking. Concern if medications today will delay left mastectomy on 03/27/2021. Has appointment with Oncology for pre-op on 03/21/2021. Plans to ask her oncologist prior to the 03/21/2021 if medications are ok to take with pending surgery.    Patient Active Problem List   Diagnosis Date Noted   Genetic testing 03/12/2021   Family history of breast cancer 02/27/2021   Family history of melanoma 02/27/2021   Family history of glioblastoma 02/27/2021   Malignant neoplasm of upper-outer quadrant of left breast in female, estrogen receptor positive ([place]) 02/22/2021   Essential hypertension, benign 01/24/2011   Hyperlipidemia 01/24/2011   Unspecified vitamin D deficiency 01/24/2011     Current Outpatient Medications on File Prior to Visit  Medication Sig Dispense Refill   amLODipine (NORVASC) 5 MG tablet Take 1 tablet (5 mg total) by mouth daily. 90 tablet 0   anastrozole (ARIMIDEX) 1 MG tablet Take 1 tablet (1 mg total) by mouth daily. 30 tablet 3   Cholecalciferol 25 MCG (1000 UT) CHEW Vitamin D3 25 mcg (1,000 unit) chewable tablet  Take 1 tablet every day by oral route.     Multiple Vitamin (MULTIVITAMIN) capsule Take 1 capsule by mouth daily.     No current facility-administered medications on file prior to visit.    Allergies  Allergen Reactions   Zocor [Simvastatin]     Leg pain   Wasp Venom Protein Itching   Yellow Jacket Venom [Bee Venom]     Social History    Socioeconomic History   Marital status: Divorced    Spouse name: Not on file   Number of children: 2   Years of education: 12+   Highest education level: Not on file  Occupational History   Not on file  Tobacco Use   Smoking status: Never   Smokeless tobacco: Never  Substance and Sexual Activity   Alcohol use: Yes    Alcohol/week: 1.0 standard drink    Types: 1 Glasses of wine per week    Comment: Some weeks 0 wine   Drug use: No   Sexual activity: Not Currently    Birth control/protection: Diaphragm, I.U.D., Pill, Post-menopausal, Other-see comments    Comment: Tubal ligation  Other Topics Concern   Not on file  Social History Narrative   Not on file   Social Determinants of Health   Financial Resource Strain: Not on file  Food Insecurity: No Food Insecurity   Worried About Charity fundraiser in the Last Year: Never true   Ran Out of Food in the Last Year: Never true  Transportation Needs: No Transportation Needs   Lack of Transportation (Medical): No   Lack of Transportation (Non-Medical): No  Physical Activity: Not on file  Stress: Not on file  Social Connections: Not on file  Intimate Partner Violence: Not on file    Family History  Problem Relation Age of Onset   Breast cancer Mother 50   Diabetes Father    Brain cancer Father  Cancer Father 67       glioblastoma   Breast cancer Maternal Aunt        dx after 20   Melanoma Maternal Uncle        dx after 50, arm    Past Surgical History:  Procedure Laterality Date   CESAREAN SECTION     x3   CHOLECYSTECTOMY     TUBAL LIGATION     Not sure of date    ROS: Review of Systems Negative except as stated above  PHYSICAL EXAM: BP 135/84 (BP Location: Left Arm, Patient Position: Sitting, Cuff Size: Large)   Pulse 74   Temp 97.7 F (36.5 C)   Resp 16   Ht 5' 4.21" (1.631 m)   Wt 182 lb 3.2 oz (82.6 kg)   SpO2 97%   BMI 31.07 kg/m   Physical Exam HENT:     Head: Normocephalic and  atraumatic.  Eyes:     Extraocular Movements: Extraocular movements intact.     Conjunctiva/sclera: Conjunctivae normal.     Pupils: Pupils are equal, round, and reactive to light.  Cardiovascular:     Rate and Rhythm: Normal rate and regular rhythm.     Pulses: Normal pulses.     Heart sounds: Normal heart sounds.  Pulmonary:     Effort: Pulmonary effort is normal.     Breath sounds: Normal breath sounds.  Musculoskeletal:     Cervical back: Normal range of motion and neck supple.  Skin:    Comments: Bilateral plantar aspect of feet with erythematous and cracking rash.   Neurological:     General: No focal deficit present.     Mental Status: She is alert and oriented to person, place, and time.  Psychiatric:        Mood and Affect: Mood normal.        Behavior: Behavior normal.    ASSESSMENT AND PLAN: 1. Other eczema: - Bilateral feet plantar aspect with eczematous rash.  - Begin Clotrimazole cream and Triamcinolone cream as prescribed.  - Counseled to confirm with Oncology if today's prescriptions will delay left mastectomy scheduled 03/27/2021. Patient agreeable and plans to call the Oncology office to confirm soon. - Follow-up with primary provider as scheduled.  - clotrimazole (LOTRIMIN AF) 1 % cream; Apply 1 application topically 2 (two) times daily.  Dispense: 45 g; Refill: 1 - triamcinolone cream (KENALOG) 0.1 %; Apply 1 application topically 2 (two) times daily.  Dispense: 45 g; Refill: 1    Patient was given the opportunity to ask questions.  Patient verbalized understanding of the plan and was able to repeat key elements of the plan. Patient was given clear instructions to go to Emergency Department or return to medical center if symptoms don't improve, worsen, or new problems develop.The patient verbalized understanding.    Requested Prescriptions   Signed Prescriptions Disp Refills   clotrimazole (LOTRIMIN AF) 1 % cream 45 g 1    Sig: Apply 1 application  topically 2 (two) times daily.   triamcinolone cream (KENALOG) 0.1 % 45 g 1    Sig: Apply 1 application topically 2 (two) times daily.    Follow-up with primary provider as scheduled.  Terri Herter, NP

## 2021-03-13 ENCOUNTER — Ambulatory Visit (INDEPENDENT_AMBULATORY_CARE_PROVIDER_SITE_OTHER): Payer: Medicare Other | Admitting: Family

## 2021-03-13 ENCOUNTER — Encounter: Payer: Self-pay | Admitting: Family

## 2021-03-13 VITALS — BP 135/84 | HR 74 | Temp 97.7°F | Resp 16 | Ht 64.21 in | Wt 182.2 lb

## 2021-03-13 DIAGNOSIS — L308 Other specified dermatitis: Secondary | ICD-10-CM

## 2021-03-13 MED ORDER — TRIAMCINOLONE ACETONIDE 0.1 % EX CREA
1.0000 | TOPICAL_CREAM | Freq: Two times a day (BID) | CUTANEOUS | 1 refills | Status: DC
Start: 2021-03-13 — End: 2021-05-27

## 2021-03-13 MED ORDER — CLOTRIMAZOLE 1 % EX CREA: 1.0000 "application " | TOPICAL_CREAM | Freq: Two times a day (BID) | CUTANEOUS | 1 refills | Status: DC

## 2021-03-13 NOTE — Progress Notes (Signed)
Pt presents for eczema on bottom of both feet denies any itching or pain

## 2021-03-13 NOTE — Patient Instructions (Signed)
Eczema Eczema refers to a group of skin conditions that cause skin to become rough and inflamed. Each type of eczema has different triggers, symptoms, and treatments. Eczema of any type is usually itchy. Symptoms range from mild to severe. Eczema is not spread from person to person (is not contagious). It can appear on different parts of the body at different times. One person's eczema may look different from another person's eczema. What are the causes? The exact cause of this condition is not known. However, exposure to certain environmental factors, irritants, and allergens can make the condition worse. What are the signs or symptoms? Symptoms of this condition depend on the type of eczema you have. The types include: Contact dermatitis. There are two kinds: Irritant contact dermatitis. This happens when something irritates the skin and causes a rash. Allergic contact dermatitis. This happens when your skin comes in contact with something you are allergic to (allergens). This can include poison ivy, chemicals, or medicines that were applied to your skin. Atopic dermatitis. This is a long-term (chronic) skin disease that keeps coming back (recurring). It is the most common type of eczema. Usual symptoms are a red rash and itchy, dry, scaly skin. It usually starts showing signs in infancy and can last through adulthood. Dyshidrotic eczema. This is a form of eczema on the hands and feet. It shows up as very itchy, fluid-filled blisters. It can affect people of any age but is more common before age 40. Hand eczema. This causes very itchy areas of skin on the palms and sides of the hands and fingers. This type of eczema is common in industrial jobs where you may be exposed to different types of irritants. Lichen simplex chronicus. This type of eczema occurs when a person constantly scratches one area of the body. Repeated scratching of the area leads to thickened skin (lichenification). This condition can  accompany other types of eczema. It is more common in adults but may also be seen in children. Nummular eczema. This is a common type of eczema that most often affects the lower legs and the backs of the hands. It typically causes an itchy, red, circular, crusty lesion (plaque). Scratching may become a habit and can cause bleeding. Nummular eczema occurs most often in middle-aged or older people. Seborrheic dermatitis. This is a common skin disease that mainly affects the scalp. It may also affect other oily areas of the body, such as the face, sides of the nose, eyebrows, ears, eyelids, and chest. It is marked by small scaling and redness of the skin (erythema). This can affect people of all ages. In infants, this condition is called cradle cap. Stasis dermatitis. This is a common skin disease that can cause itching, scaling, and hyperpigmentation, usually on the legs and feet. It occurs most often in people who have a condition that prevents blood from being pumped through the veins in the legs (chronic venous insufficiency). Stasis dermatitis is a chronic condition that needs long-term management. How is this diagnosed? This condition may be diagnosed based on: A physical exam of your skin. Your medical history. Skin patch tests. These tests involve using patches that contain possible allergens and placing them on your back. Your health care provider will check in a few days to see if an allergic reaction occurred. How is this treated? Treatment for eczema is based on the type of eczema you have. You may be given hydrocortisone steroid medicine or antihistamines. These can relieve itching quickly and help reduce inflammation.   These may be prescribed or purchased over the counter, depending on the strength that is needed. Follow these instructions at home: Take or apply over-the-counter and prescription medicines only as told by your health care provider. Use creams or ointments to moisturize your  skin. Do not use lotions. Learn what triggers or irritates your symptoms so you can avoid these things. Treat symptom flare-ups quickly. Do not scratch your skin. This can make your rash worse. Keep all follow-up visits. This is important. Where to find more information American Academy of Dermatology: aad.org National Eczema Association: nationaleczema.org The Society for Pediatric Dermatology: pedsderm.net Contact a health care provider if: You have severe itching, even with treatment. You scratch your skin regularly until it bleeds. Your rash looks different than usual. Your skin is painful, swollen, or more red than usual. You have a fever. Summary Eczema refers to a group of skin conditions that cause skin to become rough and inflamed. Each type has different triggers. Eczema of any type causes itching that may range from mild to severe. Treatment varies based on the type of eczema you have. Hydrocortisone steroid medicine or antihistamines can help with itching and inflammation. Protecting your skin is the best way to prevent eczema. Use creams or ointments to moisturize your skin. Avoid triggers and irritants. Treat flare-ups quickly. This information is not intended to replace advice given to you by your health care provider. Make sure you discuss any questions you have with your health care provider. Document Revised: 05/07/2020 Document Reviewed: 05/07/2020 Elsevier Patient Education  2022 Elsevier Inc.  

## 2021-03-20 NOTE — Progress Notes (Addendum)
Surgical Instructions    Your procedure is scheduled on Wednesday, August 17th, 2022.   Report to [facility] Main Entrance "A" at 09:00 A.M., then check in with the Admitting office.  Call this number if you have problems the morning of surgery:  [phone]   If you have any questions prior to your surgery date call [phone]: Open Monday-Friday 8am-4pm    Remember:  Do not eat after midnight the night before your surgery  You may drink clear liquids until 08:00 the morning of your surgery.   Clear liquids allowed are: Water, Non-Citrus Juices (without pulp), Carbonated Beverages, Clear Tea, Black Coffee Only, and Gatorade  Patient Instructions   The day of surgery (if you have diabetes): Drink ONE (1) 12 oz G2 given to you in your pre admission testing appointment by 08:00 the morning of surgery. Drink in one sitting. Do not sip.  This drink was given to you during your hospital  pre-op appointment visit.  Nothing else to drink after completing the  12 oz bottle of G2.         If you have questions, please contact your surgeon's office.     Take these medicines the morning of surgery with A SIP OF WATER:  amLODipine (NORVASC) acetaminophen (TYLENOL) - if needed  Ask your doctor if you need to stop taking anastrozole (ARIMIDEX) before surgery  As of today, STOP taking any Aspirin (unless otherwise instructed by your surgeon) Aleve, Naproxen, Ibuprofen, Motrin, Advil, Goody's, BC's, all herbal medications, fish oil, and all vitamins.   HOW TO MANAGE YOUR DIABETES BEFORE AND AFTER SURGERY  Why is it important to control my blood sugar before and after surgery? Improving blood sugar levels before and after surgery helps healing and can limit problems. A way of improving blood sugar control is eating a healthy diet by:  Eating less sugar and carbohydrates  Increasing activity/exercise  Talking with your doctor about reaching your blood sugar goals High blood sugars  (greater than 180 mg/dL) can raise your risk of infections and slow your recovery, so you will need to focus on controlling your diabetes during the weeks before surgery. Make sure that the doctor who takes care of your diabetes knows about your planned surgery including the date and location.  How do I manage my blood sugar before surgery? Check your blood sugar at least 4 times a day, starting 2 days before surgery, to make sure that the level is not too high or low.  Check your blood sugar the morning of your surgery when you wake up and every 2 hours until you get to the Short Stay unit.  If your blood sugar is less than 70 mg/dL, you will need to treat for low blood sugar: Do not take insulin. Treat a low blood sugar (less than 70 mg/dL) with  cup of clear juice (cranberry or apple), 4 glucose tablets, OR glucose gel. Recheck blood sugar in 15 minutes after treatment (to make sure it is greater than 70 mg/dL). If your blood sugar is not greater than 70 mg/dL on recheck, call [phone] for further instructions. Report your blood sugar to the short stay nurse when you get to Short Stay.  If you are admitted to the hospital after surgery: Your blood sugar will be checked by the staff and you will probably be given insulin after surgery (instead of oral diabetes medicines) to make sure you have good blood sugar levels. The goal for blood sugar control after surgery  is 80-180 mg/dL.           Do not wear jewelry or makeup Do not wear lotions, powders, perfumes, or deodorant. Do not shave 48 hours prior to surgery.   Do not bring valuables to the hospital. DO Not wear nail polish, gel polish, artificial nails, or any other type of covering on natural nails including finger and toenails. If patients have artificial nails, gel coating, etc. that need to be removed by a nail salon please have this removed prior to surgery or surgery may need to be canceled/delayed if the surgeon/ anesthesia  feels like the patient is unable to be adequately monitored.             [facility] is not responsible for any belongings or valuables.  Do NOT Smoke (Tobacco/Vaping) or drink Alcohol 24 hours prior to your procedure If you use a CPAP at night, you may bring all equipment for your overnight stay.   Contacts, glasses, dentures or bridgework may not be worn into surgery, please bring cases for these belongings   For patients admitted to the hospital, discharge time will be determined by your treatment team.   Patients discharged the day of surgery will not be allowed to drive home, and someone needs to stay with them for 24 hours.  ONLY 1 SUPPORT PERSON MAY BE PRESENT WHILE YOU ARE IN SURGERY. IF YOU ARE TO BE ADMITTED ONCE YOU ARE IN YOUR ROOM YOU WILL BE ALLOWED TWO (2) VISITORS.  Minor children may have two parents present. Special consideration for safety and communication needs will be reviewed on a case by case basis.  Special instructions:    Oral Hygiene is also important to reduce your risk of infection.  Remember - BRUSH YOUR TEETH THE MORNING OF SURGERY WITH YOUR REGULAR TOOTHPASTE   - Preparing For Surgery  Before surgery, you can play an important role. Because skin is not sterile, your skin needs to be as free of germs as possible. You can reduce the number of germs on your skin by washing with CHG (chlorahexidine gluconate) Soap before surgery.  CHG is an antiseptic cleaner which kills germs and bonds with the skin to continue killing germs even after washing.     Please do not use if you have an allergy to CHG or antibacterial soaps. If your skin becomes reddened/irritated stop using the CHG.  Do not shave (including legs and underarms) for at least 48 hours prior to first CHG shower. It is OK to shave your face.  Please follow these instructions carefully.     Shower the NIGHT BEFORE SURGERY and the MORNING OF SURGERY with CHG Soap.   If you chose to wash  your hair, wash your hair first as usual with your normal shampoo. After you shampoo, rinse your hair and body thoroughly to remove the shampoo.  Then [employer] and genitals (private parts) with your normal soap and rinse thoroughly to remove soap.  After that Use CHG Soap as you would any other liquid soap. You can apply CHG directly to the skin and wash gently with a scrungie or a clean washcloth.   Apply the CHG Soap to your body ONLY FROM THE NECK DOWN.  Do not use on open wounds or open sores. Avoid contact with your eyes, ears, mouth and genitals (private parts). Wash Face and genitals (private parts)  with your normal soap.   Wash thoroughly, paying special attention to the area where your surgery  will be performed.  Thoroughly rinse your body with warm water from the neck down.  DO NOT shower/wash with your normal soap after using and rinsing off the CHG Soap.  Pat yourself dry with a CLEAN TOWEL.  Wear CLEAN PAJAMAS to bed the night before surgery  Place CLEAN SHEETS on your bed the night before your surgery  DO NOT SLEEP WITH PETS.   Day of Surgery:  Take a shower with CHG soap. Wear Clean/Comfortable clothing the morning of surgery Do not apply any deodorants/lotions.   Remember to brush your teeth WITH YOUR REGULAR TOOTHPASTE.   Please read over the following fact sheets that you were given.

## 2021-03-21 ENCOUNTER — Encounter (HOSPITAL_COMMUNITY): Payer: Self-pay

## 2021-03-21 ENCOUNTER — Other Ambulatory Visit: Payer: Self-pay

## 2021-03-21 ENCOUNTER — Encounter (HOSPITAL_COMMUNITY)
Admission: RE | Admit: 2021-03-21 | Discharge: 2021-03-21 | Disposition: A | Discharge status: Home / Self Care | Payer: Medicare Other | Source: Ambulatory Visit | Attending: General Surgery | Admitting: General Surgery

## 2021-03-21 DIAGNOSIS — Z01810 Encounter for preprocedural cardiovascular examination: Secondary | ICD-10-CM | POA: Diagnosis present

## 2021-03-21 NOTE — Progress Notes (Signed)
PCP - Antony Blackbird, MD Cardiologist - denies Oncologist - Truitt Merle, MD  PPM/ICD - denies Device Orders - N/A Rep Notified - N/A  Chest x-ray - N/A EKG - 03/21/2021 Stress Test - denies ECHO - denies Cardiac Cath - denies  Sleep Study - denies CPAP - M/A  Fasting Blood Sugar - N/A  Blood Thinner Instructions: N/A  Aspirin Instructions: Patient was instructed: As of today, STOP taking any Aspirin (unless otherwise instructed by your surgeon) Aleve, Naproxen, Ibuprofen, Motrin, Advil, Goody's, BC's, all herbal medications, fish oil, and all vitamins.  Patient was instructed to ask MD if she have to stop taking Anastrozole before surgery. Patient verbalized understanding.  ERAS Protcol - yes PRE-SURGERY G2- yes  COVID TEST- patient was instructed to got to the [place] testing site for test on 03/25/2021   Anesthesia review: yes; patient is taking Anastrozole.   Patient denies shortness of breath, fever, cough and chest pain at PAT appointment   All instructions explained to the patient, with a verbal understanding of the material. Patient agrees to go over the instructions while at home for a better understanding. Patient also instructed to self quarantine after being tested for COVID-19. The opportunity to ask questions was provided.

## 2021-03-25 ENCOUNTER — Other Ambulatory Visit: Payer: Self-pay | Admitting: General Surgery

## 2021-03-25 LAB — SARS CORONAVIRUS 2 (TAT 6-24 HRS): SARS Coronavirus 2: NEGATIVE

## 2021-03-27 ENCOUNTER — Observation Stay (HOSPITAL_COMMUNITY)
Admission: RE | Admit: 2021-03-27 | Discharge: 2021-03-28 | Disposition: A | Discharge status: Home / Self Care | Payer: Medicare Other | Attending: General Surgery | Admitting: General Surgery

## 2021-03-27 ENCOUNTER — Encounter (HOSPITAL_COMMUNITY)
Admission: RE | Disposition: A | Discharge status: Home / Self Care | Payer: Self-pay | Source: Home / Self Care | Attending: General Surgery

## 2021-03-27 ENCOUNTER — Other Ambulatory Visit: Payer: Self-pay

## 2021-03-27 ENCOUNTER — Ambulatory Visit (HOSPITAL_COMMUNITY): Payer: Medicare Other | Admitting: Anesthesiology

## 2021-03-27 ENCOUNTER — Encounter (HOSPITAL_COMMUNITY): Payer: Self-pay | Admitting: General Surgery

## 2021-03-27 ENCOUNTER — Ambulatory Visit (HOSPITAL_COMMUNITY): Payer: Medicare Other | Admitting: Physician Assistant

## 2021-03-27 ENCOUNTER — Ambulatory Visit (HOSPITAL_COMMUNITY)
Admission: RE | Admit: 2021-03-27 | Discharge: 2021-03-27 | Disposition: A | Discharge status: Home / Self Care | Payer: Medicare Other | Source: Ambulatory Visit | Attending: General Surgery | Admitting: General Surgery

## 2021-03-27 DIAGNOSIS — Z79899 Other long term (current) drug therapy: Secondary | ICD-10-CM | POA: Diagnosis not present

## 2021-03-27 DIAGNOSIS — C50412 Malignant neoplasm of upper-outer quadrant of left female breast: Secondary | ICD-10-CM | POA: Diagnosis not present

## 2021-03-27 DIAGNOSIS — Z170 Estrogen receptor positive status [ER+]: Secondary | ICD-10-CM | POA: Diagnosis not present

## 2021-03-27 DIAGNOSIS — I10 Essential (primary) hypertension: Secondary | ICD-10-CM | POA: Insufficient documentation

## 2021-03-27 DIAGNOSIS — Z803 Family history of malignant neoplasm of breast: Secondary | ICD-10-CM | POA: Insufficient documentation

## 2021-03-27 DIAGNOSIS — Z85828 Personal history of other malignant neoplasm of skin: Secondary | ICD-10-CM | POA: Diagnosis not present

## 2021-03-27 DIAGNOSIS — Z9012 Acquired absence of left breast and nipple: Secondary | ICD-10-CM

## 2021-03-27 HISTORY — PX: MASTECTOMY: SHX3

## 2021-03-27 HISTORY — PX: MASTECTOMY W/ SENTINEL NODE BIOPSY: SHX2001

## 2021-03-27 LAB — GLUCOSE, CAPILLARY: Glucose-Capillary: 106 mg/dL — ABNORMAL HIGH (ref 70–99)

## 2021-03-27 SURGERY — MASTECTOMY WITH SENTINEL LYMPH NODE BIOPSY
Anesthesia: General | Site: Breast | Laterality: Left

## 2021-03-27 MED ORDER — ACETAMINOPHEN 160 MG/5ML PO SOLN: 325.0000 mg | ORAL | Status: DC | PRN

## 2021-03-27 MED ORDER — TECHNETIUM TC 99M TILMANOCEPT KIT
1.0000 | PACK | Freq: Once | INTRAVENOUS | Status: AC | PRN
  Administered 2021-03-27: 1 via INTRADERMAL

## 2021-03-27 MED ORDER — PROPOFOL 10 MG/ML IV BOLUS
INTRAVENOUS | Status: AC
  Filled 2021-03-27: qty 20

## 2021-03-27 MED ORDER — ONDANSETRON HCL 4 MG/2ML IJ SOLN
INTRAMUSCULAR | Status: AC
  Filled 2021-03-27: qty 2

## 2021-03-27 MED ORDER — PHENYLEPHRINE 40 MCG/ML (10ML) SYRINGE FOR IV PUSH (FOR BLOOD PRESSURE SUPPORT)
PREFILLED_SYRINGE | INTRAVENOUS | Status: AC
  Filled 2021-03-27: qty 10

## 2021-03-27 MED ORDER — MEPERIDINE HCL 25 MG/ML IJ SOLN: 6.2500 mg | INTRAMUSCULAR | Status: DC | PRN

## 2021-03-27 MED ORDER — ONDANSETRON HCL 4 MG/2ML IJ SOLN: 4.0000 mg | Freq: Once | INTRAMUSCULAR | Status: DC | PRN

## 2021-03-27 MED ORDER — FENTANYL CITRATE (PF) 100 MCG/2ML IJ SOLN
25.0000 ug | INTRAMUSCULAR | Status: DC | PRN
  Administered 2021-03-27: 50 ug via INTRAVENOUS

## 2021-03-27 MED ORDER — CHLORHEXIDINE GLUCONATE 0.12 % MT SOLN
OROMUCOSAL | Status: AC
  Administered 2021-03-27: 15 mL via OROMUCOSAL
  Filled 2021-03-27: qty 15

## 2021-03-27 MED ORDER — ACETAMINOPHEN 500 MG PO TABS
ORAL_TABLET | ORAL | Status: AC
  Administered 2021-03-27: 1000 mg via ORAL
  Filled 2021-03-27: qty 2

## 2021-03-27 MED ORDER — PROPOFOL 10 MG/ML IV BOLUS
INTRAVENOUS | Status: DC | PRN
  Administered 2021-03-27: 50 mg via INTRAVENOUS
  Administered 2021-03-27: 130 mg via INTRAVENOUS

## 2021-03-27 MED ORDER — PHENYLEPHRINE 40 MCG/ML (10ML) SYRINGE FOR IV PUSH (FOR BLOOD PRESSURE SUPPORT)
PREFILLED_SYRINGE | INTRAVENOUS | Status: DC | PRN
  Administered 2021-03-27 (×2): 80 ug via INTRAVENOUS

## 2021-03-27 MED ORDER — LIDOCAINE 2% (20 MG/ML) 5 ML SYRINGE
INTRAMUSCULAR | Status: DC | PRN
  Administered 2021-03-27: 100 mg via INTRAVENOUS

## 2021-03-27 MED ORDER — ONDANSETRON HCL 4 MG/2ML IJ SOLN
INTRAMUSCULAR | Status: DC | PRN
  Administered 2021-03-27: 4 mg via INTRAVENOUS

## 2021-03-27 MED ORDER — FENTANYL CITRATE (PF) 100 MCG/2ML IJ SOLN: 50.0000 ug | Freq: Once | INTRAMUSCULAR | Status: AC

## 2021-03-27 MED ORDER — FENTANYL CITRATE (PF) 250 MCG/5ML IJ SOLN
INTRAMUSCULAR | Status: DC | PRN
  Administered 2021-03-27 (×5): 25 ug via INTRAVENOUS

## 2021-03-27 MED ORDER — MORPHINE SULFATE (PF) 2 MG/ML IV SOLN
2.0000 mg | INTRAVENOUS | Status: DC | PRN
Start: 2021-03-27 — End: 2021-03-28

## 2021-03-27 MED ORDER — ACETAMINOPHEN 500 MG PO TABS: 1000.0000 mg | ORAL_TABLET | ORAL | Status: AC

## 2021-03-27 MED ORDER — ONDANSETRON 4 MG PO TBDP: 4.0000 mg | ORAL_TABLET | Freq: Four times a day (QID) | ORAL | Status: DC | PRN

## 2021-03-27 MED ORDER — FENTANYL CITRATE (PF) 250 MCG/5ML IJ SOLN
INTRAMUSCULAR | Status: AC
  Filled 2021-03-27: qty 5

## 2021-03-27 MED ORDER — CHLORHEXIDINE GLUCONATE CLOTH 2 % EX PADS: 6.0000 | MEDICATED_PAD | Freq: Once | CUTANEOUS | Status: DC

## 2021-03-27 MED ORDER — FENTANYL CITRATE (PF) 100 MCG/2ML IJ SOLN
INTRAMUSCULAR | Status: AC
  Administered 2021-03-27: 50 ug via INTRAVENOUS
  Filled 2021-03-27: qty 2

## 2021-03-27 MED ORDER — CEFAZOLIN SODIUM-DEXTROSE 2-4 GM/100ML-% IV SOLN: 2.0000 g | INTRAVENOUS | Status: DC

## 2021-03-27 MED ORDER — CHLORHEXIDINE GLUCONATE 0.12 % MT SOLN: 15.0000 mL | Freq: Once | OROMUCOSAL | Status: AC

## 2021-03-27 MED ORDER — OXYCODONE HCL 5 MG PO TABS: 5.0000 mg | ORAL_TABLET | Freq: Once | ORAL | Status: AC | PRN

## 2021-03-27 MED ORDER — SODIUM CHLORIDE 0.9 % IV SOLN: INTRAVENOUS | Status: DC

## 2021-03-27 MED ORDER — ROPIVACAINE HCL 5 MG/ML IJ SOLN
INTRAMUSCULAR | Status: DC | PRN
  Administered 2021-03-27: 30 mL

## 2021-03-27 MED ORDER — ACETAMINOPHEN 500 MG PO TABS
1000.0000 mg | ORAL_TABLET | Freq: Four times a day (QID) | ORAL | Status: DC
  Administered 2021-03-27 – 2021-03-28 (×3): 1000 mg via ORAL
  Filled 2021-03-27 (×3): qty 2

## 2021-03-27 MED ORDER — 0.9 % SODIUM CHLORIDE (POUR BTL) OPTIME
TOPICAL | Status: DC | PRN
Start: 2021-03-27 — End: 2021-03-27
  Administered 2021-03-27: 1000 mL

## 2021-03-27 MED ORDER — LACTATED RINGERS IV SOLN: INTRAVENOUS | Status: DC

## 2021-03-27 MED ORDER — METHOCARBAMOL 500 MG PO TABS
500.0000 mg | ORAL_TABLET | Freq: Four times a day (QID) | ORAL | Status: DC | PRN
  Administered 2021-03-27 (×2): 500 mg via ORAL
  Filled 2021-03-27 (×2): qty 1

## 2021-03-27 MED ORDER — ENSURE PRE-SURGERY PO LIQD: 296.0000 mL | Freq: Once | ORAL | Status: DC

## 2021-03-27 MED ORDER — ONDANSETRON HCL 4 MG/2ML IJ SOLN: 4.0000 mg | Freq: Four times a day (QID) | INTRAMUSCULAR | Status: DC | PRN

## 2021-03-27 MED ORDER — OXYCODONE HCL 5 MG PO TABS
5.0000 mg | ORAL_TABLET | ORAL | Status: DC | PRN
  Administered 2021-03-27: 10 mg via ORAL
  Administered 2021-03-27: 5 mg via ORAL
  Filled 2021-03-27: qty 1
  Filled 2021-03-27: qty 2

## 2021-03-27 MED ORDER — CEFAZOLIN SODIUM-DEXTROSE 2-4 GM/100ML-% IV SOLN
INTRAVENOUS | Status: AC
  Filled 2021-03-27: qty 100

## 2021-03-27 MED ORDER — ORAL CARE MOUTH RINSE: 15.0000 mL | Freq: Once | OROMUCOSAL | Status: AC

## 2021-03-27 MED ORDER — PHENYLEPHRINE HCL-NACL 20-0.9 MG/250ML-% IV SOLN
INTRAVENOUS | Status: DC | PRN
  Administered 2021-03-27: 25 ug/min via INTRAVENOUS

## 2021-03-27 MED ORDER — AMLODIPINE BESYLATE 5 MG PO TABS: 5.0000 mg | ORAL_TABLET | Freq: Every day | ORAL | Status: DC

## 2021-03-27 MED ORDER — ACETAMINOPHEN 325 MG PO TABS: 325.0000 mg | ORAL_TABLET | ORAL | Status: DC | PRN

## 2021-03-27 MED ORDER — DEXAMETHASONE SODIUM PHOSPHATE 10 MG/ML IJ SOLN
INTRAMUSCULAR | Status: DC | PRN
  Administered 2021-03-27: 10 mg

## 2021-03-27 MED ORDER — OXYCODONE HCL 5 MG PO TABS
ORAL_TABLET | ORAL | Status: AC
  Administered 2021-03-27: 5 mg via ORAL
  Filled 2021-03-27: qty 1

## 2021-03-27 MED ORDER — OXYCODONE HCL 5 MG/5ML PO SOLN: 5.0000 mg | Freq: Once | ORAL | Status: AC | PRN

## 2021-03-27 MED ORDER — LACTATED RINGERS IV SOLN: INTRAVENOUS | Status: DC | PRN

## 2021-03-27 MED ORDER — SIMETHICONE 80 MG PO CHEW: 40.0000 mg | CHEWABLE_TABLET | Freq: Four times a day (QID) | ORAL | Status: DC | PRN

## 2021-03-27 SURGICAL SUPPLY — 63 items
ADH SKN CLS APL DERMABOND .7 (GAUZE/BANDAGES/DRESSINGS) ×2
APL PRP STRL LF DISP 70% ISPRP (MISCELLANEOUS) ×1
APPLIER CLIP 9.375 MED OPEN (MISCELLANEOUS) ×2
APR CLP MED 9.3 20 MLT OPN (MISCELLANEOUS) ×1
BAG COUNTER SPONGE SURGICOUNT (BAG) ×2 IMPLANT
BAG SPNG CNTER NS LX DISP (BAG) ×1
BINDER BREAST LRG (GAUZE/BANDAGES/DRESSINGS) IMPLANT
BINDER BREAST XLRG (GAUZE/BANDAGES/DRESSINGS) IMPLANT
BINDER BREAST XXLRG (GAUZE/BANDAGES/DRESSINGS) ×1 IMPLANT
BIOPATCH RED 1 DISK 7.0 (GAUZE/BANDAGES/DRESSINGS) ×1 IMPLANT
CANISTER SUCT 3000ML PPV (MISCELLANEOUS) ×2 IMPLANT
CHLORAPREP W/TINT 26 (MISCELLANEOUS) ×2 IMPLANT
CLIP APPLIE 9.375 MED OPEN (MISCELLANEOUS) IMPLANT
CNTNR URN SCR LID CUP LEK RST (MISCELLANEOUS) ×1 IMPLANT
CONT SPEC 4OZ STRL OR WHT (MISCELLANEOUS) ×2
COVER PROBE W GEL 5X96 (DRAPES) ×2 IMPLANT
COVER SURGICAL LIGHT HANDLE (MISCELLANEOUS) ×2 IMPLANT
DERMABOND ADVANCED (GAUZE/BANDAGES/DRESSINGS) ×2
DERMABOND ADVANCED .7 DNX12 (GAUZE/BANDAGES/DRESSINGS) ×1 IMPLANT
DRAIN CHANNEL 19F RND (DRAIN) ×2 IMPLANT
DRAPE LAPAROSCOPIC ABDOMINAL (DRAPES) ×2 IMPLANT
DRSG PAD ABDOMINAL 8X10 ST (GAUZE/BANDAGES/DRESSINGS) ×2 IMPLANT
DRSG TEGADERM 4X4.75 (GAUZE/BANDAGES/DRESSINGS) ×1 IMPLANT
ELECT BLADE 4.0 EZ CLEAN MEGAD (MISCELLANEOUS) ×2
ELECT CAUTERY BLADE 6.4 (BLADE) ×2 IMPLANT
ELECT REM PT RETURN 9FT ADLT (ELECTROSURGICAL) ×2
ELECTRODE BLDE 4.0 EZ CLN MEGD (MISCELLANEOUS) ×1 IMPLANT
ELECTRODE REM PT RTRN 9FT ADLT (ELECTROSURGICAL) ×1 IMPLANT
EVACUATOR SILICONE 100CC (DRAIN) ×2 IMPLANT
GAUZE SPONGE 4X4 12PLY STRL (GAUZE/BANDAGES/DRESSINGS) ×2 IMPLANT
GLOVE SURG ENC MOIS LTX SZ7 (GLOVE) ×1 IMPLANT
GLOVE SURG UNDER POLY LF SZ7.5 (GLOVE) ×2 IMPLANT
GOWN STRL REUS W/ TWL LRG LVL3 (GOWN DISPOSABLE) ×3 IMPLANT
GOWN STRL REUS W/TWL LRG LVL3 (GOWN DISPOSABLE) ×6
KIT BASIN OR (CUSTOM PROCEDURE TRAY) ×2 IMPLANT
KIT TURNOVER KIT B (KITS) ×2 IMPLANT
MARKER SKIN DUAL TIP RULER LAB (MISCELLANEOUS) ×2 IMPLANT
NDL 18GX1X1/2 (RX/OR ONLY) (NEEDLE) IMPLANT
NDL FILTER BLUNT 18X1 1/2 (NEEDLE) IMPLANT
NDL HYPO 25GX1X1/2 BEV (NEEDLE) IMPLANT
NEEDLE 18GX1X1/2 (RX/OR ONLY) (NEEDLE) ×2 IMPLANT
NEEDLE FILTER BLUNT 18X 1/2SAF (NEEDLE)
NEEDLE FILTER BLUNT 18X1 1/2 (NEEDLE) IMPLANT
NEEDLE HYPO 25GX1X1/2 BEV (NEEDLE) IMPLANT
NS IRRIG 1000ML POUR BTL (IV SOLUTION) ×2 IMPLANT
PACK GENERAL/GYN (CUSTOM PROCEDURE TRAY) ×2 IMPLANT
PAD ARMBOARD 7.5X6 YLW CONV (MISCELLANEOUS) ×2 IMPLANT
PENCIL SMOKE EVACUATOR (MISCELLANEOUS) ×1 IMPLANT
PIN SAFETY STERILE (MISCELLANEOUS) ×2 IMPLANT
SPECIMEN JAR X LARGE (MISCELLANEOUS) ×2 IMPLANT
STAPLER VISISTAT 35W (STAPLE) ×1 IMPLANT
STRIP CLOSURE SKIN 1/2X4 (GAUZE/BANDAGES/DRESSINGS) ×3 IMPLANT
SUT ETHILON 2 0 FS 18 (SUTURE) ×2 IMPLANT
SUT MNCRL AB 4-0 PS2 18 (SUTURE) ×3 IMPLANT
SUT SILK 2 0 SH (SUTURE) ×1 IMPLANT
SUT VIC AB 2-0 SH 18 (SUTURE) ×1 IMPLANT
SUT VIC AB 3-0 54X BRD REEL (SUTURE) ×1 IMPLANT
SUT VIC AB 3-0 BRD 54 (SUTURE)
SUT VIC AB 3-0 SH 18 (SUTURE) ×3 IMPLANT
SUT VIC AB 3-0 SH 8-18 (SUTURE) ×1 IMPLANT
SYR CONTROL 10ML LL (SYRINGE) ×1 IMPLANT
TOWEL GREEN STERILE (TOWEL DISPOSABLE) ×2 IMPLANT
TOWEL GREEN STERILE FF (TOWEL DISPOSABLE) ×2 IMPLANT

## 2021-03-27 NOTE — Anesthesia Postprocedure Evaluation (Signed)
Anesthesia Post Note  Patient: Terri Hawkins  Procedure(s) Performed: LEFT MASTECTOMY WITH LEFT AXILLARY SENTINEL LYMPH NODE BIOPSY (Left: Breast)     Patient location during evaluation: PACU Anesthesia Type: General Level of consciousness: awake and alert Pain management: pain level controlled Vital Signs Assessment: post-procedure vital signs reviewed and stable Respiratory status: spontaneous breathing, nonlabored ventilation, respiratory function stable and patient connected to nasal cannula oxygen Cardiovascular status: blood pressure returned to baseline and stable Postop Assessment: no apparent nausea or vomiting Anesthetic complications: no   No notable events documented.  Last Vitals:  Vitals:   03/27/21 1432 03/27/21 2014  BP: 140/63 (!) 118/54  Pulse: 70 67  Resp: 16 16  Temp: 36.8 C 36.5 C  SpO2: 97% 95%    Last Pain:  Vitals:   03/27/21 2132  TempSrc:   PainSc: 2                  Tocara Mennen

## 2021-03-27 NOTE — Interval H&P Note (Signed)
History and Physical Interval Note:  03/27/2021 9:51 AM  Terri Hawkins  has presented today for surgery, with the diagnosis of LEFT BREAST CANCER.  The various methods of treatment have been discussed with the patient and family. After consideration of risks, benefits and other options for treatment, the patient has consented to  Procedure(s): LEFT MASTECTOMY WITH LEFT AXILLARY SENTINEL LYMPH NODE BIOPSY (Left) as a surgical intervention.  The patient's history has been reviewed, patient examined, no change in status, stable for surgery.  I have reviewed the patient's chart and labs.  Questions were answered to the patient's satisfaction.     Rolm Bookbinder

## 2021-03-27 NOTE — Discharge Instructions (Signed)
[place] [facility], [place] [phone]  MASTECTOMY: POST OP INSTRUCTIONS Take 400 mg of ibuprofen every 8 hours or 650 mg tylenol every 6 hours for next 72 hours then as needed. Use ice several times daily also. Always review your discharge instruction sheet given to you by the facility where your surgery was performed. IF YOU HAVE DISABILITY OR FAMILY LEAVE FORMS, YOU MUST BRING THEM TO THE OFFICE FOR PROCESSING.   DO NOT GIVE THEM TO YOUR DOCTOR. A prescription for pain medication may be given to you upon discharge.  Take your pain medication as prescribed, if needed.  If narcotic pain medicine is not needed, then you may take acetaminophen (Tylenol), naprosyn (Alleve) or ibuprofen (Advil) as needed. Take your usually prescribed medications unless otherwise directed. If you need a refill on your pain medication, please contact your pharmacy.  They will contact our office to request authorization.  Prescriptions will not be filled after 5pm or on week-ends. You should follow a light diet the first few days after arrival home, such as soup and crackers, etc.  Resume your normal diet the day after surgery. Most patients will experience some swelling and bruising on the chest and underarm.  Ice packs will help.  Swelling and bruising can take several days to resolve. Wear the binder day and night until you return to the office.  It is common to experience some constipation if taking pain medication after surgery.  Increasing fluid intake and taking a stool softener (such as Colace) will usually help or prevent this problem from occurring.  A mild laxative (Milk of Magnesia or Miralax) should be taken according to package instructions if there are no bowel movements after 48 hours. Unless discharge instructions indicate otherwise, leave your bandage dry and in place until your next appointment in 3-5 days.  You may take a limited sponge bath.  No tube baths or showers until the drains are removed.   You may have steri-strips (small skin tapes) in place directly over the incision.  These strips should be left on the skin for 7-10 days. If you have glue it will come off in next couple week.  Any sutures will be removed at an office visit DRAINS:  If you have drains in place, it is important to keep a list of the amount of drainage produced each day in your drains.  Before leaving the hospital, you should be instructed on drain care.  Call our office if you have any questions about your drains. I will remove your drains when they put out less than 30 cc or ml for 2 consecutive days. ACTIVITIES:  You may resume regular (light) daily activities beginning the next day--such as daily self-care, walking, climbing stairs--gradually increasing activities as tolerated.  You may have sexual intercourse when it is comfortable.  Refrain from any heavy lifting or straining until approved by your doctor. You may drive when you are no longer taking prescription pain medication, you can comfortably wear a seatbelt, and you can safely maneuver your car and apply brakes. RETURN TO WORK:  __________________________________________________________ Dennis Bast should see your doctor in the office for a follow-up appointment approximately 3-5 days after your surgery.  Your doctor's nurse will typically make your follow-up appointment when she calls you with your pathology report.  Expect your pathology report 3-4business days after surgery. OTHER INSTRUCTIONS: ______________________________________________________________________________________________ ____________________________________________________________________________________________ WHEN TO CALL YOUR DR Maryann Mccall: Fever over 101.0 Nausea and/or vomiting Extreme swelling or bruising Continued bleeding from incision. Increased pain, redness,  or drainage from the incision. The clinic staff is available to answer your questions during regular business hours.  Please don't  hesitate to call and ask to speak to one of the nurses for clinical concerns.  If you have a medical emergency, go to the nearest emergency room or call 911.  A surgeon from [facility] is always on call at the hospital. [address] [place], [place], [place]  [address] ? [address], [place], [place]   [address] [phone] ? [phone] ? FAX [phone] Web site: [email or web address]

## 2021-03-27 NOTE — Anesthesia Preprocedure Evaluation (Addendum)
Anesthesia Evaluation  Patient identified by MRN, date of birth, ID band Patient awake    Reviewed: Allergy & Precautions, H&P , NPO status , Patient's Chart, lab work & pertinent test results, reviewed documented beta blocker date and time   Airway Mallampati: II  TM Distance: >3 FB Neck ROM: full    Dental no notable dental hx. (+) Teeth Intact, Dental Advisory Given, Caps,    Pulmonary neg pulmonary ROS,    Pulmonary exam normal breath sounds clear to auscultation       Cardiovascular Exercise Tolerance: Good hypertension, Pt. on medications negative cardio ROS Normal cardiovascular exam Rhythm:regular Rate:Normal     Neuro/Psych negative neurological ROS  negative psych ROS   GI/Hepatic negative GI ROS, Neg liver ROS,   Endo/Other  Morbid obesity  Renal/GU negative Renal ROS  negative genitourinary   Musculoskeletal   Abdominal   Peds  Hematology negative hematology ROS (+)   Anesthesia Other Findings   Reproductive/Obstetrics negative OB ROS                            Anesthesia Physical Anesthesia Plan  ASA: 3  Anesthesia Plan: General   Post-op Pain Management: GA combined w/ Regional for post-op pain   Induction: Intravenous  PONV Risk Score and Plan: 3 and Ondansetron  Airway Management Planned: Oral ETT and LMA  Additional Equipment: None  Intra-op Plan:   Post-operative Plan: Extubation in OR  Informed Consent: I have reviewed the patients History and Physical, chart, labs and discussed the procedure including the risks, benefits and alternatives for the proposed anesthesia with the patient or authorized representative who has indicated his/her understanding and acceptance.     Dental Advisory Given  Plan Discussed with: CRNA and Anesthesiologist  Anesthesia Plan Comments: (  )        Anesthesia Quick Evaluation

## 2021-03-27 NOTE — H&P (Signed)
70 year old female who has no prior breast history noted who has a family history of breast cancer in her mom at age 67 and a maternal aunt.  She also has a family history of glioblastoma in her father.  She has no vaginal discharge.  She underwent screening mammography that showed a left breast mass.  Her left axillary ultrasound was negative.  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  At 2:00 there like 2 other lesions measuring 7 mm and 6 mm.  1 at least his ductal carcinoma in situ and a papilloma and the other is a papilloma with out atypia.  The clips are 5.8 cm apart from the DCIS biopsy and the invasive ductal carcinoma biopsy.  she has undergone mri now as well with known malignancy measuring 2.1 cm.     She has a past medical history significant for hypertension.  She is retired     Review of Systems: A complete review of systems was obtained from the patient.  I have reviewed this information and discussed as appropriate with the patient.  See HPI as well for other ROS.   Review of Systems  All other systems reviewed and are negative.       Medical History: Past Medical History      Past Medical History:  Diagnosis Date   Hypertension     Squamous cell carcinoma, leg, left             Patient Active Problem List  Diagnosis   Malignant neoplasm of upper-outer quadrant of left breast in female, estrogen receptor positive (CMS-HCC)      Past Surgical History       Past Surgical History:  Procedure Laterality Date   CESAREAN SECTION        x2   CHOLECYSTECTOMY       LAPAROSCOPIC TUBAL LIGATION            Allergies  Not on File     No current outpatient medications on file prior to visit.    No current facility-administered medications on file prior to visit.       Family History       Family History  Problem Relation Age of Onset   Breast cancer Mother 73   Breast cancer Maternal Aunt     Brain cancer Father          glioblastoma        Social History       Tobacco Use  Smoking Status Never Smoker  Smokeless Tobacco Never Used      Social History  Social History         Socioeconomic History   Marital status: Married  Tobacco Use   Smoking status: Never Smoker   Smokeless tobacco: Never Used  Scientific laboratory technician Use: Never used  Substance and Sexual Activity   Alcohol use: Yes      Alcohol/week: 1.0 standard drink      Types: 1 Glasses of wine per week   Drug use: Never        Objective:      There were no vitals filed for this visit.  There is no height or weight on file to calculate BMI.   Physical Exam Constitutional:  Appearance: Normal appearance.  Eyes:     General: No scleral icterus. Cardiovascular:     Rate and Rhythm: Normal rate.  Pulmonary:     Effort: Pulmonary effort is normal.  Chest:  Breasts: Breasts are symmetrical.     Right: No mass or nipple discharge.     Left: Mass present. No nipple discharge.         Comments: 3 cm upper central left breast mass with hematoma Lymphadenopathy:     Upper Body:     Right upper body: No supraclavicular or axillary adenopathy.     Left upper body: No supraclavicular or axillary adenopathy.  Neurological:     Mental Status: She is alert.                 Assessment and Plan:  Diagnoses and all orders for this visit:   Malignant neoplasm of upper-outer quadrant of left breast in female, estrogen receptor positive (CMS-HCC)   Left mastectomy (she does not want to do radiotherapy), left ax sn biopsy     We discussed the staging and pathophysiology of breast cancer. We discussed all of the different options for treatment for breast cancer including surgery, chemotherapy, radiation therapy, Herceptin, and antiestrogen therapy. We discussed a  sentinel lymph node biopsy as she does not appear to having lymph node involvement right now. We discussed the performance of that with injection of radioactive tracer. We discussed that there is a chance of having a positive node with a sentinel lymph node biopsy and we will await the permanent pathology to make any other first further decisions in terms of her treatment. We discussed up to a 5% risk lifetime of chronic shoulder pain as well as lymphedema associated with a sentinel lymph node biopsy. We discussed the options for treatment of the breast cancer which included lumpectomy versus a mastectomy. We discussed the performance of the lumpectomy with radioactive seed placement. We discussed a 5-10% chance of a positive margin requiring reexcision in the operating room. We also discussed that she will likely need radiation therapy if she undergoes lumpectomy.  We discussed mastectomy and the postoperative care for that as well. Mastectomy can be followed by reconstruction. The decision for lumpectomy vs mastectomy has no impact on decision for chemotherapy. Most mastectomy patients will not need radiation therapy. We discussed that there is no difference in her survival whether she undergoes lumpectomy with radiation therapy or antiestrogen therapy versus a mastectomy. There is also no real difference between her recurrence in the breast. I think she will need mastectomy but she would like to consider double lumpectomy so will await mri and then see her after that.   We discussed the risks of operation including bleeding, infection, possible reoperation. She understands her further therapy will be based on what her stages at the time of her operation.

## 2021-03-27 NOTE — Anesthesia Procedure Notes (Signed)
Procedure Name: LMA Insertion Date/Time: 03/27/2021 10:30 AM Performed by: Glynda Jaeger, CRNA Pre-anesthesia Checklist: Patient identified, Emergency Drugs available, Suction available and Patient being monitored Patient Re-evaluated:Patient Re-evaluated prior to induction Oxygen Delivery Method: Circle System Utilized Preoxygenation: Pre-oxygenation with 100% oxygen Induction Type: IV induction Ventilation: Mask ventilation without difficulty LMA: LMA inserted LMA Size: 4.0 Number of attempts: 1 Airway Equipment and Method: Bite block Placement Confirmation: positive ETCO2 Tube secured with: Tape Dental Injury: Teeth and Oropharynx as per pre-operative assessment

## 2021-03-27 NOTE — Transfer of Care (Signed)
Immediate Anesthesia Transfer of Care Note  Patient: Terri Hawkins  Procedure(s) Performed: LEFT MASTECTOMY WITH LEFT AXILLARY SENTINEL LYMPH NODE BIOPSY (Left: Breast)  Patient Location: PACU  Anesthesia Type:General  Level of Consciousness: awake, alert , oriented, patient cooperative and responds to stimulation  Airway & Oxygen Therapy: Patient Spontanous Breathing and Patient connected to face mask oxygen  Post-op Assessment: Report given to RN and Post -op Vital signs reviewed and stable  Post vital signs: Reviewed and stable  Last Vitals:  Vitals Value Taken Time  BP 136/63 03/27/21 1237  Temp 36.8 C 03/27/21 1235  Pulse 65 03/27/21 1241  Resp 13 03/27/21 1241  SpO2 100 % 03/27/21 1241  Vitals shown include unvalidated device data.  Last Pain:  Vitals:   03/27/21 0925  TempSrc: Oral  PainSc:          Complications: No notable events documented.

## 2021-03-27 NOTE — Op Note (Signed)
Preoperative diagnosis: Clinical stage I left breast cancer Postoperative diagnosis: Same as above Procedure: 1.  Left total mastectomy 2.  Left deep axillary sentinel lymph node biopsy Surgeon: Dr. Serita Grammes Anesthesia: General with a pectoral block Estimated blood loss: 30 cc Drains: 19 French Blake drain Specimens: 1.  Left breast marked short superior, long lateral 2.  Left axillary sentinel lymph node with highest count of 4709 Complications: None Sponge and count was correct completion Disposition recovery stable condition  Indications:70 year old female who a  underwent screening mammography that showed a left breast mass.  Her left axillary ultrasound was negative.  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  At 2:00 there like 2 other lesions measuring 7 mm and 6 mm.  1 at least his ductal carcinoma in situ and a papilloma and the other is a papilloma with out atypia.  The clips are 5.8 cm apart from the DCIS biopsy and the invasive ductal carcinoma biopsy.  she has undergone mri now as well with known malignancy measuring 2.1 cm.  She elected undergo mastectomy.  Procedure: After informed consent was obtained the patient was taken to the operating room.  She was given antibiotics.  SCDs were in place.  She was placed under general anesthesia without complication.  A timeout was then performed.  I injected 2 cc of mag trace in the subareolar position.  I then massaged this for 5 minutes.  She then underwent a pectoral block.  I then made an incision in a reduction pattern.  I remove the nipple and the areola.  I then carried the flaps to the clavicle, parasternal area, inframammary fold, and latissimus laterally.  I then remove the breast tissue including the pectoralis fashion from the  muscle.  This was rolled laterally.  I used the probe to identify the sentinel node.  This was removed.  This had some brown staining to it as well.  There were no other sentinel nodes that were noted.  I then remove the breast and passed this off the table.  This was marked as above.  Hemostasis was obtained.  I placed a 33 [place] Blake drain and secured this with a 2-0 nylon suture.  I then closed all of this with 3-0 Vicryl and 4-0 Monocryl.  Glue and Steri-Strips were applied.  She tolerated this well was extubated transferred to recovery stable.

## 2021-03-27 NOTE — Anesthesia Procedure Notes (Addendum)
Anesthesia Regional Block: Pectoralis block   Pre-Anesthetic Checklist: , timeout performed,  Correct Patient, Correct Site, Correct Laterality,  Correct Procedure, Correct Position, site marked,  Risks and benefits discussed,  Surgical consent,  Pre-op evaluation,  At surgeon's request and post-op pain management  Laterality: Left  Prep: chloraprep       Needles:  Injection technique: Single-shot  Needle Type: Echogenic Stimulator Needle     Needle Length: 5cm  Needle Gauge: 22     Additional Needles:   Procedures:, nerve stimulator,,, ultrasound used (permanent image in chart),,    Narrative:  Start time: 03/27/2021 10:50 AM End time: 03/27/2021 11:00 AM Injection made incrementally with aspirations every 5 mL.  Performed by: Personally  Anesthesiologist: Janeece Riggers, MD  Additional Notes: Functioning IV was confirmed and monitors were applied.  A 65m 22ga Arrow echogenic stimulator needle was used. Sterile prep and drape,hand hygiene and sterile gloves were used. Ultrasound guidance: relevant anatomy identified, needle position confirmed, local anesthetic spread visualized around nerve(s)., vascular puncture avoided.  Image printed for medical record. Negative aspiration and negative test dose prior to incremental administration of local anesthetic. The patient tolerated the procedure well.

## 2021-03-28 ENCOUNTER — Encounter (HOSPITAL_COMMUNITY): Payer: Self-pay | Admitting: General Surgery

## 2021-03-28 DIAGNOSIS — C50412 Malignant neoplasm of upper-outer quadrant of left female breast: Secondary | ICD-10-CM | POA: Diagnosis not present

## 2021-03-28 MED ORDER — OXYCODONE HCL 5 MG PO TABS: 5.0000 mg | ORAL_TABLET | ORAL | 0 refills | Status: DC | PRN

## 2021-03-28 MED ORDER — METHOCARBAMOL 500 MG PO TABS: 500.0000 mg | ORAL_TABLET | Freq: Four times a day (QID) | ORAL | 0 refills | Status: DC | PRN

## 2021-03-28 NOTE — Plan of Care (Signed)
  Problem: Education: Goal: Knowledge of General Education information will improve Description: Including pain rating scale, medication(s)/side effects and non-pharmacologic comfort measures Outcome: Progressing   Problem: Pain Managment: Goal: General experience of comfort will improve Outcome: Progressing   

## 2021-03-28 NOTE — Discharge Summary (Signed)
Physician Discharge Summary  Patient ID: Terri Hawkins MRN: [id number] DOB/AGE: [birth year] 70 y.o.  Admit date: 03/27/2021 Discharge date: 03/28/2021  Admission Diagnoses: Left breast cancer HTN  Discharge Diagnoses:  Active Problems:   S/P mastectomy, left   Discharged Condition: good  Hospital Course: 70 yof underwent left mastectomy with sn biopsy for breast cancer. She is doing well with good pain control. Tol diet and ready for dc  Consults: None  Significant Diagnostic Studies: none  Treatments: surgery: left mastectomy/sn biopsy  Discharge Exam: Blood pressure (!) 127/59, pulse (!) 57, temperature 98.1 F (36.7 C), temperature source Oral, resp. rate 16, height '5\' 4"'$  (1.626 m), weight 83.9 kg, SpO2 97 %. Left mastectomy site flaps viable, drain  as expected  Disposition: Discharge disposition: 01-Home or Self Care        Allergies as of 03/28/2021       Reactions   Zocor [simvastatin]    Leg pain   Wasp Venom Protein Itching   Blood pressure goes up   Yellow Jacket Venom [bee Venom] Itching   Blood pressure up        Medication List     TAKE these medications    acetaminophen 500 MG tablet Commonly known as: TYLENOL Take 500 mg by mouth every 6 (six) hours as needed for moderate pain or headache.   amLODipine 5 MG tablet Commonly known as: NORVASC Take 1 tablet (5 mg total) by mouth daily.   anastrozole 1 MG tablet Commonly known as: ARIMIDEX Take 1 tablet (1 mg total) by mouth daily.   Cholecalciferol 25 MCG (1000 UT) Chew Chew 1,000 Units by mouth daily.   clotrimazole 1 % cream Commonly known as: Lotrimin AF Apply 1 application topically 2 (two) times daily.   melatonin 3 MG Tabs tablet Take 6 mg by mouth at bedtime as needed (sleep).   methocarbamol 500 MG tablet Commonly known as: ROBAXIN Take 1 tablet (500 mg total) by mouth every 6 (six) hours as needed for muscle spasms.   multivitamin capsule Take 1 capsule by mouth  daily.   oxyCODONE 5 MG immediate release tablet Commonly known as: Oxy IR/ROXICODONE Take 1 tablet (5 mg total) by mouth every 4 (four) hours as needed for moderate pain.   triamcinolone cream 0.1 % Commonly known as: KENALOG Apply 1 application topically 2 (two) times daily.        Follow-up Information     Rolm Bookbinder, MD Follow up in 2 week(s).   Specialty: General Surgery Contact information: [address] [place] [address] [phone]                 Signed: Rolm Bookbinder 03/28/2021, 6:21 AM

## 2021-03-29 ENCOUNTER — Telehealth: Payer: Self-pay

## 2021-03-29 LAB — SURGICAL PATHOLOGY

## 2021-03-29 NOTE — Telephone Encounter (Signed)
Transition Care Management Follow-up Telephone Call   Date of discharge and from where:[facility] on 03/28/2021 How have you been since you were released from the hospital? ok Any questions or concerns? No questions/concerns reported.  Items Reviewed: Did the pt receive and understand the discharge instructions provided? Pt have the instructions and have no questions.  Medications obtained and verified? She said that she has the medication list  and the hospital staff reviewed them in detail prior to discharge. She said that he has all of the medications and they have no questions.  Any new allergies since your discharge? None reported  Do you have support at home? Yes, family Other (ie: DME, Home Health, etc)        Functional Questionnaire: (I = Independent and D = Dependent) ADL's:  Independent.        Follow up appointments reviewed:   PCP Hospital f/u appt confirmed? Pt stated she will for a app she prefers no sooner appt at this time [facility] f/u appt confirmed? scheduled at this time as stated by pacient Are transportation arrangements needed? have transportation   If their condition worsens, is the pt aware to call  their PCP or go to the ED? Yes.Made pt aware if condition worsen or start experiencing rapid weight gain, chest pain, diff breathing, SOB, high fevers, or bleading to refer imediately to ED for further evaluation.  Was the patient provided with contact information for the PCP's office or ED? He has the phone number  Was the pt encouraged to call back with questions or concerns?yes

## 2021-04-02 ENCOUNTER — Encounter: Payer: Self-pay | Admitting: *Deleted

## 2021-04-02 ENCOUNTER — Ambulatory Visit: Payer: Self-pay | Admitting: Genetic Counselor

## 2021-04-02 ENCOUNTER — Telehealth: Payer: Self-pay | Admitting: Genetic Counselor

## 2021-04-02 ENCOUNTER — Telehealth: Payer: Self-pay | Admitting: *Deleted

## 2021-04-02 DIAGNOSIS — Z808 Family history of malignant neoplasm of other organs or systems: Secondary | ICD-10-CM

## 2021-04-02 DIAGNOSIS — C50412 Malignant neoplasm of upper-outer quadrant of left female breast: Secondary | ICD-10-CM

## 2021-04-02 DIAGNOSIS — Z1379 Encounter for other screening for genetic and chromosomal anomalies: Secondary | ICD-10-CM

## 2021-04-02 DIAGNOSIS — Z803 Family history of malignant neoplasm of breast: Secondary | ICD-10-CM

## 2021-04-02 DIAGNOSIS — Z170 Estrogen receptor positive status [ER+]: Secondary | ICD-10-CM

## 2021-04-02 NOTE — Telephone Encounter (Signed)
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  Thus, it is important for her to remain in contact with [place] in genetics as we learn more information about VUS and to see if additional genetic testing is recommended in the future.  Recommended family be managed based on hormonal factors and family history of breast cancer.

## 2021-04-02 NOTE — Telephone Encounter (Signed)
Received order for oncotype testing. Requisition faxed to pathology and [facility] °

## 2021-04-07 NOTE — Progress Notes (Signed)
HPI:  Ms. Victoria was previously seen in the [facility] due to a personal and family history of cancer and concerns regarding a hereditary predisposition to cancer. Please refer to our prior cancer genetics clinic note for more information regarding our discussion, assessment and recommendations, at the time. Ms. Swift recent genetic test results were disclosed to her, as were recommendations warranted by these results. These results and recommendations are discussed in more detail below.  CANCER HISTORY:  Oncology History Overview Note  Cancer Staging Malignant neoplasm of upper-outer quadrant of left breast in female, estrogen receptor positive ([place]) Staging form: Breast, AJCC 8th Edition - Clinical stage from 02/19/2021: Stage IB (cT2, cN0, cM0, G2, ER+, PR+, HER2-) - Signed by Truitt Merle, MD on 02/26/2021 Stage prefix: Initial diagnosis Histologic grading system: 3 grade system    Malignant neoplasm of upper-outer quadrant of left breast in female, estrogen receptor positive ([place])  01/16/2021 Mammogram   DIGITAL SCREENING BILATERAL MAMMOGRAM WITH TOMOSYNTHESIS AND CAD  IMPRESSION: Further evaluation is suggested for a possible mass in the left breast.   02/08/2021 Mammogram   DIGITAL DIAGNOSTIC UNILATERAL LEFT MAMMOGRAM WITH TOMOSYNTHESIS AND CAD; ULTRASOUND LEFT BREAST LIMITED  IMPRESSION: 1. Highly suspicious left breast mass at the 12 o'clock position corresponding with the screening mammographic findings. It measures 2.6 x 2.1 x 1.5 cm. Recommendation is for ultrasound-guided biopsy. 2. Two additional indeterminate left breast masses at the 2 o'clock position 6 cm and 7 cm from the nipple. One of these likely corresponds with an additional mammographic mass. Recommendation is for ultrasound-guided biopsy. 3. No suspicious left axillary lymphadenopathy.     02/19/2021 Pathology Results   Diagnosis 1. Breast, left, needle core biopsy, 2 o'clock 6 cmfn  (ribbon) - INTRADUCTAL PAPILLOMA WITHOUT ATYPIA 2. Breast, left, needle core biopsy, 2 o'clock 7 cmfn (coil) - DUCTAL CARCINOMA IN-SITU ARISING IN INTRADUCTAL PAPILLOMA - SEE COMMENT 3. Breast, left, needle core biopsy, 12 o'clock (heart) - INVASIVE DUCTAL CARCINOMA - SEE COMMENT Microscopic Comment 3. Based on the biopsy, the carcinoma appears Nottingham grade 2 of 3 and measures 0.7 cm in greatest linear extent.  3. PROGNOSTIC INDICATORS Results: IMMUNOHISTOCHEMICAL AND MORPHOMETRIC ANALYSIS PERFORMED MANUALLY The tumor cells are EQUIVOCAL for Her2 (2+). Her2 by FISH will be performed and results reported separately. Estrogen Receptor: 95%, POSITIVE, STRONG STAINING INTENSITY Progesterone Receptor: 95%, POSITIVE, STRONG STAINING INTENSITY Proliferation Marker Ki67: 5%  3. FLUORESCENCE IN-SITU HYBRIDIZATION Results: GROUP 5: HER2 **NEGATIVE** Equivocal form of amplification of the HER2 gene was detected in the IHC 2+ tissue sample received from this individual. HER2 FISH was performed by a technologist and cell imaging and analysis on the [employer]. RATIO OF HER2/CEN17 SIGNALS 1.43 AVERAGE HER2 COPY NUMBER PER CELL 2.00   02/19/2021 Cancer Staging   Staging form: Breast, AJCC 8th Edition - Clinical stage from 02/19/2021: Stage IB (cT2, cN0, cM0, G2, ER+, PR+, HER2-) - Signed by Truitt Merle, MD on 02/26/2021 Stage prefix: Initial diagnosis Histologic grading system: 3 grade system   02/22/2021 Initial Diagnosis   Malignant neoplasm of upper-outer quadrant of left breast in female, estrogen receptor positive ([place])   03/05/2021 Genetic Testing   No pathogenic variants detected in [facility] BRCAPlus Panel and [facility] CancerNext-Expanded +RNAinsight Panel.  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  Of note, at the time of testing, [facility] classified this variant as uncertain; however, other laboratories classify this as likely pathogenic.  The report dates are March 05, 2021 and March 18, 2021.   The BRCAplus panel offered by [facility] and includes sequencing and deletion/duplication analysis for the following 8 genes: ATM, BRCA1, BRCA2, CDH1, CHEK2, PALB2, PTEN, and TP53.  The CancerNext-Expanded gene panel offered by [facility] and includes sequencing, rearrangement, and RNA analysis for the following 77 genes: AIP, ALK, APC, ATM, AXIN2, BAP1, BARD1, BLM, BMPR1A, BRCA1, BRCA2, BRIP1, CDC73, CDH1, CDK4, CDKN1B, CDKN2A, CHEK2, CTNNA1, DICER1, FANCC, FH, FLCN, GALNT12, KIF1B, LZTR1, MAX, MEN1, MET, MLH1, MSH2, MSH3, MSH6, MUTYH, NBN, NF1, NF2, NTHL1, PALB2, PHOX2B, PMS2, POT1, PRKAR1A, PTCH1, PTEN, RAD51C, RAD51D, RB1, RECQL, RET, SDHA, SDHAF2, SDHB, SDHC, SDHD, SMAD4, SMARCA4, SMARCB1, SMARCE1, STK11, SUFU, TMEM127, TP53, TSC1, TSC2, VHL and XRCC2 (sequencing and deletion/duplication); EGFR, EGLN1, HOXB13, KIT, MITF, PDGFRA, POLD1, and POLE (sequencing only); EPCAM and GREM1 (deletion/duplication only).      FAMILY HISTORY:  We obtained a detailed, 4-generation family history.  Significant diagnoses are listed below: Family History  Problem Relation Age of Onset   Breast cancer Mother 108   Diabetes Father    Brain cancer Father    Cancer Father 7       glioblastoma   Breast cancer Maternal Aunt        dx after 52   Melanoma Maternal Uncle        dx after 50, arm     Ms. Morocho is unaware of previous family history of genetic testing for hereditary cancer risks. There is no reported Ashkenazi Jewish ancestry. There is no known consanguinity.    GENETIC TEST RESULTS: Genetic testing reported out on March 18, 2021.  The CancerNext-Expanded +RNAinsight Panel detected no pathogenic mutations. The CancerNext-Expanded gene panel offered by [facility] and includes sequencing, rearrangement, and RNA analysis for the following 77 genes: AIP, ALK, APC, ATM, AXIN2, BAP1, BARD1, BLM, BMPR1A, BRCA1, BRCA2, BRIP1, CDC73, CDH1, CDK4, CDKN1B, CDKN2A,  CHEK2, CTNNA1, DICER1, FANCC, FH, FLCN, GALNT12, KIF1B, LZTR1, MAX, MEN1, MET, MLH1, MSH2, MSH3, MSH6, MUTYH, NBN, NF1, NF2, NTHL1, PALB2, PHOX2B, PMS2, POT1, PRKAR1A, PTCH1, PTEN, RAD51C, RAD51D, RB1, RECQL, RET, SDHA, SDHAF2, SDHB, SDHC, SDHD, SMAD4, SMARCA4, SMARCB1, SMARCE1, STK11, SUFU, TMEM127, TP53, TSC1, TSC2, VHL and XRCC2 (sequencing and deletion/duplication); EGFR, EGLN1, HOXB13, KIT, MITF, PDGFRA, POLD1, and POLE (sequencing only); EPCAM and GREM1 (deletion/duplication only).   The test report has been scanned into EPIC and is located under the Molecular Pathology section of the Results Review tab.  A portion of the result report is included below for reference.     We discussed with Ms. Matuszak that because current genetic testing is not perfect, it is possible there may be a gene mutation in one of these genes that current testing cannot detect, but that chance is small.  We also discussed, that there could be another gene that has not yet been discovered, or that we have not yet tested, that is responsible for the cancer diagnoses in the family. It is also possible there is a hereditary cause for the cancer in the family that Ms. Paterson did not inherit and therefore was not identified in her testing.  Therefore, it is important to remain in touch with cancer genetics in the future so that we can continue to offer Ms. Coll the most up to date genetic testing.   Genetic testing did identify a variant of uncertain significance (VUS) in the CHEK2 gene called c.1421G>A (p.R474H).  At this time, it is unknown if this variant is associated with increased cancer risk or if this is a  normal finding.  While [facility] classifies the CHEK2 c.1421G>A (p.R474) variant as inconclusive (VUS), other laboratories find this variant to be likely pathogenic.  This variant might have lower penetrance (lower cancer risks) compared to other CHEK2 mutations.  This lack of agreement between labs creates  additional uncertainty and makes screening recommendations difficult.  This result is inconclusive.If we do learn more about this variant, we will try to contact Ms. Prigmore to discuss it further. However, it is important to stay in touch with [place] periodically and keep the address and phone number up to date.   ADDITIONAL GENETIC TESTING: We discussed with Ms. Ericson that her genetic testing was fairly extensive.  If there are genes identified to increase cancer risk that can be analyzed in the future, we would be happy to discuss and coordinate this testing at that time.    CANCER SCREENING RECOMMENDATIONS: Ms. Shells's test result is considered negative (normal).  This means that we have not identified a hereditary cause for her personal history of cancer at this time. Most cancers happen by chance and this negative test suggests that her cancer may fall into this category.    While reassuring, this does not definitively rule out a hereditary predisposition to cancer. It is still possible that there could be genetic mutations that are undetectable by current technology. There could be genetic mutations in genes that have not been tested or identified to increase cancer risk.  Therefore, it is recommended she continue to follow the cancer management and screening guidelines provided by her oncology and primary healthcare provider.   An individual's cancer risk and medical management are not determined by genetic test results alone. Overall cancer risk assessment incorporates additional factors, including personal medical history, family history, and any available genetic information that may result in a personalized plan for cancer prevention and surveillance  RECOMMENDATIONS FOR FAMILY MEMBERS:  Individuals in this family might be at some increased risk of developing cancer, over the general population risk, simply due to the family history of cancer.  We recommend females in this family be managed  for breast cancer risk based on their personal, hormonal, and family history factors.  We recommended women in this family have a yearly mammogram beginning at age 9, or 47 years younger than the earliest onset of cancer, an annual clinical breast exam, and perform monthly breast self-exams. Women in this family should also have a gynecological exam as recommended by their primary provider.  All family members should be referred for colonoscopy starting at age 53.  FOLLOW-UP: Lastly, we discussed with Ms. Moorehead that cancer genetics is a rapidly advancing field and it is possible that new genetic tests will be appropriate for her and/or her family members in the future. We encouraged her to remain in contact with cancer genetics on an annual basis so we can update her personal and family histories and let her know of advances in cancer genetics that may benefit this family.   Our contact number was provided. Ms. Rosander questions were answered to her satisfaction, and she knows she is welcome to call us at anytime with additional questions or concerns.     Deasia Chiu M. Joette Catching, [place], [facility] Genetic Counselor [email or web address] (P) [phone]

## 2021-04-12 ENCOUNTER — Encounter: Payer: Self-pay | Admitting: *Deleted

## 2021-04-12 ENCOUNTER — Telehealth: Payer: Self-pay | Admitting: *Deleted

## 2021-04-12 NOTE — Telephone Encounter (Signed)
Received oncotype results of 1/3%. Patient is aware. Schedule message sent for follow up with Dr. Burr Medico.

## 2021-04-16 ENCOUNTER — Telehealth: Payer: Self-pay | Admitting: Hematology

## 2021-04-16 NOTE — Telephone Encounter (Signed)
Scheduled appt per 9/2 sch msg. Pt aware.

## 2021-04-17 ENCOUNTER — Other Ambulatory Visit: Payer: Self-pay

## 2021-04-17 ENCOUNTER — Ambulatory Visit: Payer: Medicare Other | Attending: General Surgery

## 2021-04-17 DIAGNOSIS — R600 Localized edema: Secondary | ICD-10-CM | POA: Diagnosis present

## 2021-04-17 DIAGNOSIS — Z170 Estrogen receptor positive status [ER+]: Secondary | ICD-10-CM | POA: Diagnosis present

## 2021-04-17 DIAGNOSIS — R293 Abnormal posture: Secondary | ICD-10-CM | POA: Diagnosis present

## 2021-04-17 DIAGNOSIS — C50412 Malignant neoplasm of upper-outer quadrant of left female breast: Secondary | ICD-10-CM | POA: Diagnosis not present

## 2021-04-17 NOTE — Therapy (Signed)
[place], [place], [address] Phone: [phone]   Fax:  [phone]  Physical Therapy Treatment  Patient Details  Name: Terri Hawkins MRN: [id number] Date of Birth: Dec 19, 1950 Referring Provider (PT): Dr. Donne Hazel   Encounter Date: 04/17/2021   PT End of Session - 04/17/21 1344     Visit Number 2    Number of Visits 10    Date for PT Re-Evaluation 05/15/21    PT Start Time 1301    PT Stop Time 7209    PT Time Calculation (min) 42 min    Activity Tolerance Patient tolerated treatment well    Behavior During Therapy [facility] for tasks assessed/performed             Past Medical History:  Diagnosis Date   Allergy 2016, I think   Yellow jacket sting   Breast cancer ([place]) 02/2021   Eczema    Family history of breast cancer 02/27/2021   Family history of glioblastoma 02/27/2021   Family history of melanoma 02/27/2021   Hyperlipidemia    Hypertension    Obesity    Pre-diabetes    Skin cancer Not sure of date   [facility]   Vitamin D deficiency     Past Surgical History:  Procedure Laterality Date   CESAREAN SECTION     x3   CHOLECYSTECTOMY     MASTECTOMY W/ SENTINEL NODE BIOPSY Left 03/27/2021   Procedure: LEFT MASTECTOMY WITH LEFT AXILLARY SENTINEL LYMPH NODE BIOPSY;  Surgeon: Rolm Bookbinder, MD;  Location: [facility];  Service: General;  Laterality: Left;   TONSILLECTOMY     TUBAL LIGATION     Not sure of date    There were no vitals filed for this visit.   Subjective Assessment - 04/17/21 1257     Subjective Pt. is here for post op reassessment after left mastectomy with SLNB on 03/27/2021. Drain removed on 04/05/2021 and pt released to full activity by MD. Still feel really sore, and maybe swollen under my armpit.  Have done the exercises some, and I am left handed so I have been gardening, weed eating etc. Its hard to sleep at night, and I can sleep a little on either  side. muscles in back get sore and achy at times.    Pertinent History Suspicious area found with screening mammogram.  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  She started neoadjuvant Anastrazole, but started having some sideeffects so is to discontinue for a week. Left mastectomy with SLNB performed on  03/27/2021. Drain removed on 04/05/2021    Patient Stated Goals post-op reassessment    Currently in Pain? No/denies    Pain Score 0-No pain    Pain Location Chest    Pain Orientation Left    Pain Descriptors / Indicators Sore    Pain Frequency Intermittent    Multiple Pain Sites Yes    Pain Score 0    Pain Location Back    Pain Descriptors / Indicators Aching    Pain Onset More than a month ago    Pain Frequency Intermittent    Aggravating Factors  worse at night    Effect of Pain on Daily Activities no real limitation during the day                [facility] PT Assessment - 04/17/21 0001       Assessment  Medical Diagnosis Left breast Cancer    Referring Provider (PT) Dr. Donne Hazel    Onset Date/Surgical Date 03/27/21   Biopsy 02/19/2021 confirmed   Hand Dominance Left      Balance Screen   Has the patient fallen in the past 6 months No    Has the patient had a decrease in activity level because of a fear of falling?  No    Is the patient reluctant to leave their home because of a fear of falling?  No      Observation/Other Assessments   Skin Integrity incisions healing, scabs still present. Fibrosis noted medial to axilla. mild cording noted in left axilla      Posture/Postural Control   Posture/Postural Control Postural limitations    Postural Limitations Rounded Shoulders;Forward head      AROM   Left Shoulder Flexion 128 Degrees   axillary tightness   Left Shoulder ABduction 121 Degrees   pain at area of drain   Left Shoulder External Rotation 84 Degrees               LYMPHEDEMA/ONCOLOGY  QUESTIONNAIRE - 04/17/21 0001       Type   Cancer Type Left IDC Breast Cancer      Surgeries   Mastectomy Date 03/27/21    Sentinel Lymph Node Biopsy Date 03/27/21    Number Lymph Nodes Removed 5      Treatment   Active Chemotherapy Treatment No    Past Chemotherapy Treatment No    Active Radiation Treatment No    Past Radiation Treatment No    Current Hormone Treatment Yes    Past Hormone Therapy No      What other symptoms do you have   Are you Having Heaviness or Tightness Yes   axillary tightness   Are you having Pain Yes    Are you having pitting edema No    Is it Hard or Difficult finding clothes that fit No    Do you have infections No      Left Upper Extremity Lymphedema   10 cm Proximal to Olecranon Process 33.3 cm    Olecranon Process 29 cm    10 cm Proximal to Ulnar Styloid Process 25.1 cm    Just Proximal to Ulnar Styloid Process 16.7 cm    Across Hand at [employer] 20.1 cm    At [place] of 2nd Digit 7 cm                              Upper Extremity Functional Index Score :   22.73/80        PT Long Term Goals - 04/17/21 1352       PT LONG TERM GOAL #1   Title Pt will have bilateral shoulder ROM returned to WNL    Time 4    Period Weeks    Status New      PT LONG TERM GOAL #2   Title Pt will be independent in a HEP to improve shoulder ROM /strength    Time 4    Period Weeks    Status New    Target Date 05/15/21      PT LONG TERM GOAL #3   Title Pt will attend ABC class and understand precautions for lymphedema    Time 4    Period Weeks    Status New    Target Date 05/15/21  PT LONG TERM GOAL #4   Title Pt will have decreased complaints of tightness by atleast 50%    Time 4    Period Weeks    Status New    Target Date 05/15/21      PT LONG TERM GOAL #5   Title Pt will be independent in MLD prn to decrease left chest swelling    Time 4    Period Weeks    Status New    Target Date 05/15/21                    Plan - 04/17/21 1345     Clinical Impression Statement Pt is s/p left mastectomy with SLNB with 5 LN removed on 03/27/2021. She presents with mild left axillary cording, Decreased left shoulder ROM, and  left axillary swelling with some fibrosis.  Her incisions are still healing with scabs present.  She was given a script to get a compression bra to hold in a chip pack to address fibrosis.  We reviewed post op exercises and pt was advised to perform 2-3 times per day to improve ROM. She will benefit from skilled PT to address deficits and return to PLOF    Personal Factors and Comorbidities Comorbidity 1    Comorbidities Left mastectomy with SLNB, no rad or chemo    Stability/Clinical Decision Making Stable/Uncomplicated    Clinical Decision Making Low    Rehab Potential Excellent    PT Frequency 2x / week    PT Duration 4 weeks    PT Treatment/Interventions ADLs/Self Care Home Management;Therapeutic exercise;Patient/family education;Orthotic Fit/Training;Manual lymph drainage;Manual techniques;Scar mobilization;Passive range of motion    PT Next Visit Plan did pt get compression bra, chip pack for left chest fibrosis, try supine wand exs, MFR to cording at axilla, PROM, MLD and pt instruction,scar mobs when ready    PT Home Exercise Plan continue 4 post op exs 2-3 times /day    Recommended Other Services ABC class set up today, SOZO already set    Consulted and Agree with Plan of Care Patient             Patient will benefit from skilled therapeutic intervention in order to improve the following deficits and impairments:  Decreased knowledge of precautions, Postural dysfunction, Decreased scar mobility, Decreased range of motion, Increased edema, Pain, Decreased skin integrity, Impaired flexibility  Visit Diagnosis: Malignant neoplasm of upper-outer quadrant of left breast in female, estrogen receptor positive ([facility])  Abnormal posture  Localized edema     Problem  List Patient Active Problem List   Diagnosis Date Noted   S/P mastectomy, left 03/27/2021   Genetic testing 03/12/2021   Family history of breast cancer 02/27/2021   Family history of melanoma 02/27/2021   Family history of glioblastoma 02/27/2021   Malignant neoplasm of upper-outer quadrant of left breast in female, estrogen receptor positive ([place]) 02/22/2021   Essential hypertension, benign 01/24/2011   Hyperlipidemia 01/24/2011   Unspecified vitamin D deficiency 01/24/2011    Claris Pong, PT 04/17/2021, 1:56 PM  [place], [place], [address] Phone: [phone]   Fax:  [phone]  Name: LAVINIA MCNEELY MRN: [id number] Date of Birth: 11-10-1950

## 2021-04-18 ENCOUNTER — Encounter: Payer: Self-pay | Admitting: *Deleted

## 2021-04-18 ENCOUNTER — Encounter: Payer: Self-pay | Admitting: Hematology

## 2021-04-21 ENCOUNTER — Ambulatory Visit (INDEPENDENT_AMBULATORY_CARE_PROVIDER_SITE_OTHER): Payer: Medicare Other | Admitting: Family

## 2021-04-21 DIAGNOSIS — Z0000 Encounter for general adult medical examination without abnormal findings: Secondary | ICD-10-CM | POA: Diagnosis not present

## 2021-04-21 NOTE — Patient Instructions (Signed)
Health Maintenance, Female Adopting a healthy lifestyle and getting preventive care are important in promoting health and wellness. Ask your health care provider about: The right schedule for you to have regular tests and exams. Things you can do on your own to prevent diseases and keep yourself healthy. What should I know about diet, weight, and exercise? Eat a healthy diet  Eat a diet that includes plenty of vegetables, fruits, low-fat dairy products, and lean protein. Do not eat a lot of foods that are high in solid fats, added sugars, or sodium. Maintain a healthy weight Body mass index (BMI) is used to identify weight problems. It estimates body fat based on height and weight. Your health care provider can help determine your BMI and help you achieve or maintain a healthy weight. Get regular exercise Get regular exercise. This is one of the most important things you can do for your health. Most adults should: Exercise for at least 150 minutes each week. The exercise should increase your heart rate and make you sweat (moderate-intensity exercise). Do strengthening exercises at least twice a week. This is in addition to the moderate-intensity exercise. Spend less time sitting. Even light physical activity can be beneficial. Watch cholesterol and blood lipids Have your blood tested for lipids and cholesterol at 70 years of age, then have this test every 5 years. Have your cholesterol levels checked more often if: Your lipid or cholesterol levels are high. You are older than 70 years of age. You are at high risk for heart disease. What should I know about cancer screening? Depending on your health history and family history, you may need to have cancer screening at various ages. This may include screening for: Breast cancer. Cervical cancer. Colorectal cancer. Skin cancer. Lung cancer. What should I know about heart disease, diabetes, and high blood pressure? Blood pressure and heart  disease High blood pressure causes heart disease and increases the risk of stroke. This is more likely to develop in people who have high blood pressure readings, are of African descent, or are overweight. Have your blood pressure checked: Every 3-5 years if you are 18-39 years of age. Every year if you are 40 years old or older. Diabetes Have regular diabetes screenings. This checks your fasting blood sugar level. Have the screening done: Once every three years after age 40 if you are at a normal weight and have a low risk for diabetes. More often and at a younger age if you are overweight or have a high risk for diabetes. What should I know about preventing infection? Hepatitis B If you have a higher risk for hepatitis B, you should be screened for this virus. Talk with your health care provider to find out if you are at risk for hepatitis B infection. Hepatitis C Testing is recommended for: Everyone born from 1945 through 1965. Anyone with known risk factors for hepatitis C. Sexually transmitted infections (STIs) Get screened for STIs, including gonorrhea and chlamydia, if: You are sexually active and are younger than 70 years of age. You are older than 70 years of age and your health care provider tells you that you are at risk for this type of infection. Your sexual activity has changed since you were last screened, and you are at increased risk for chlamydia or gonorrhea. Ask your health care provider if you are at risk. Ask your health care provider about whether you are at high risk for HIV. Your health care provider may recommend a prescription medicine   to help prevent HIV infection. If you choose to take medicine to prevent HIV, you should first get tested for HIV. You should then be tested every 3 months for as long as you are taking the medicine. Pregnancy If you are about to stop having your period (premenopausal) and you may become pregnant, seek counseling before you get  pregnant. Take 400 to 800 micrograms (mcg) of folic acid every day if you become pregnant. Ask for birth control (contraception) if you want to prevent pregnancy. Osteoporosis and menopause Osteoporosis is a disease in which the bones lose minerals and strength with aging. This can result in bone fractures. If you are 65 years old or older, or if you are at risk for osteoporosis and fractures, ask your health care provider if you should: Be screened for bone loss. Take a calcium or vitamin D supplement to lower your risk of fractures. Be given hormone replacement therapy (HRT) to treat symptoms of menopause. Follow these instructions at home: Lifestyle Do not use any products that contain nicotine or tobacco, such as cigarettes, e-cigarettes, and chewing tobacco. If you need help quitting, ask your health care provider. Do not use street drugs. Do not share needles. Ask your health care provider for help if you need support or information about quitting drugs. Alcohol use Do not drink alcohol if: Your health care provider tells you not to drink. You are pregnant, may be pregnant, or are planning to become pregnant. If you drink alcohol: Limit how much you use to 0-1 drink a day. Limit intake if you are breastfeeding. Be aware of how much alcohol is in your drink. In the [place] one drink equals one 12 oz bottle of beer (355 mL), one 5 oz glass of wine (148 mL), or one 1 oz glass of hard liquor (44 mL). General instructions Schedule regular health, dental, and eye exams. Stay current with your vaccines. Tell your health care provider if: You often feel depressed. You have ever been abused or do not feel safe at home. Summary Adopting a healthy lifestyle and getting preventive care are important in promoting health and wellness. Follow your health care provider's instructions about healthy diet, exercising, and getting tested or screened for diseases. Follow your health care provider's  instructions on monitoring your cholesterol and blood pressure. This information is not intended to replace advice given to you by your health care provider. Make sure you discuss any questions you have with your health care provider. Document Revised: 10/05/2020 Document Reviewed: 07/21/2018 Elsevier Patient Education  2022 Elsevier Inc.  

## 2021-04-21 NOTE — Progress Notes (Signed)
Subjective:   Terri Hawkins is a 70 y.o. female who presents for an Initial Medicare Annual Wellness Visit.  I connected with  FABIENNE DOMBROSKI on 04/21/21 by an audio only telemedicine application and verified that I am speaking with the correct person using two identifiers.   I discussed the limitations, risks, security and privacy concerns of performing an evaluation and management service by telephone and the availability of in person appointments. I also discussed with the patient that there may be a patient responsible charge related to this service. The patient expressed understanding and verbally consented to this telephonic visit.  Location of Patient: Home Location of Provider: Office  List any persons and their role that are participating in the visit with the patient: No other persons assisted during the visit.    Review of Systems     Defer to PCP  Patient states that there is a need for a change in diagnosis history. 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 This was removed by dermatology.  Cardiac Risk Factors include: advanced age (>51mn, >>4women);dyslipidemia;hypertension     Objective:    Today's Vitals   04/21/21 0914  PainSc: 0-No pain   There is no height or weight on file to calculate BMI.  Advanced Directives 04/21/2021 03/27/2021 03/21/2021 03/12/2021 06/16/2014  Does Patient Have a Medical Advance Directive? Yes Yes Yes Yes No  Type of [place];Out of facility DNR (pink MOST or yellow form) -  Does patient want to make changes to medical advance directive? Yes (MAU/Ambulatory/Procedural Areas - Information given) - - No - Patient declined -  Copy of [place] Chart? No - copy requested No - copy requested - No - copy requested -  Would patient  like information on creating a medical advance directive? - - - - No - patient declined information    Current Medications (verified) Outpatient Encounter Medications as of 04/21/2021  Medication Sig   acetaminophen (TYLENOL) 500 MG tablet Take 500 mg by mouth every 6 (six) hours as needed for moderate pain or headache.   amLODipine (NORVASC) 5 MG tablet Take 1 tablet (5 mg total) by mouth daily.   anastrozole (ARIMIDEX) 1 MG tablet Take 1 tablet (1 mg total) by mouth daily.   Cholecalciferol 25 MCG (1000 UT) CHEW Chew 1,000 Units by mouth daily.   melatonin 3 MG TABS tablet Take 6 mg by mouth at bedtime as needed (sleep).   Multiple Vitamin (MULTIVITAMIN) capsule Take 1 capsule by mouth daily.   clotrimazole (LOTRIMIN AF) 1 % cream Apply 1 application topically 2 (two) times daily. (Patient not taking: No sig reported)   methocarbamol (ROBAXIN) 500 MG tablet Take 1 tablet (500 mg total) by mouth every 6 (six) hours as needed for muscle spasms. (Patient not taking: Reported on 04/21/2021)   oxyCODONE (OXY IR/ROXICODONE) 5 MG immediate release tablet Take 1 tablet (5 mg total) by mouth every 4 (four) hours as needed for moderate pain. (Patient not taking: Reported on 04/21/2021)   triamcinolone cream (KENALOG) 0.1 % Apply 1 application topically 2 (two) times daily. (Patient not taking: No sig reported)   No facility-administered encounter medications on file as of 04/21/2021.    Allergies (verified) Zocor [simvastatin], Wasp venom protein, and Yellow jacket venom [bee venom]   History: Past Medical History:  Diagnosis Date  Allergy 2016, I think   Yellow jacket sting   Breast cancer ([place]) 02/2021   Eczema    Family history of breast cancer 02/27/2021   Family history of glioblastoma 02/27/2021   Family history of melanoma 02/27/2021   Hyperlipidemia    Hypertension    Obesity    Pre-diabetes    Skin cancer Not sure of date   [facility]   Vitamin D deficiency    Past  Surgical History:  Procedure Laterality Date   CESAREAN SECTION     x3   CHOLECYSTECTOMY     MASTECTOMY W/ SENTINEL NODE BIOPSY Left 03/27/2021   Procedure: LEFT MASTECTOMY WITH LEFT AXILLARY SENTINEL LYMPH NODE BIOPSY;  Surgeon: Rolm Bookbinder, MD;  Location: [facility];  Service: General;  Laterality: Left;   TONSILLECTOMY     TUBAL LIGATION     Not sure of date   Family History  Problem Relation Age of Onset   Breast cancer Mother 81   Diabetes Father    Brain cancer Father    Cancer Father 9       glioblastoma   Breast cancer Maternal Aunt        dx after 27   Melanoma Maternal Uncle        dx after 50, arm   Social History   Socioeconomic History   Marital status: Divorced    Spouse name: Not on file   Number of children: 2   Years of education: 14   Highest education level: Bachelor's degree (e.g., BA, AB, BS)  Occupational History   Occupation: retire    Fish farm manager: [employer]    Comment: Estate manager/land agent  Tobacco Use   Smoking status: Never   Smokeless tobacco: Never  Vaping Use   Vaping Use: Never used  Substance and Sexual Activity   Alcohol use: Yes    Alcohol/week: 1.0 standard drink    Types: 1 Glasses of wine per week    Comment: Some weeks 0 wine   Drug use: No   Sexual activity: Not Currently    Birth control/protection: Diaphragm, I.U.D., Pill, Post-menopausal, Other-see comments    Comment: Tubal ligation  Other Topics Concern   Not on file  Social History Narrative   Not on file   Social Determinants of Health   Financial Resource Strain: Low Risk    Difficulty of Paying Living Expenses: Not hard at all  Food Insecurity: No Food Insecurity   Worried About Charity fundraiser in the Last Year: Never true   Ran Out of Food in the Last Year: Never true  Transportation Needs: No Transportation Needs   Lack of Transportation (Medical): No   Lack of Transportation (Non-Medical): No  Physical Activity: Insufficiently Active   Days of  Exercise per Week: 7 days   Minutes of Exercise per Session: 20 min  Stress: No Stress Concern Present   Feeling of Stress : Not at all  Social Connections: Moderately Isolated   Frequency of Communication with Friends and Family: More than three times a week   Frequency of Social Gatherings with Friends and Family: Once a week   Attends Religious Services: Never   Marine scientist or Organizations: No   Attends Music therapist: Never   Marital Status: Living with partner    Tobacco Counseling Counseling given: No   Clinical Intake:  Pre-visit preparation completed: Yes  Pain : No/denies pain Pain Score: 0-No pain     Nutritional Risks:  None Diabetes: No  How often do you need to have someone help you when you read instructions, pamphlets, or other written materials from your doctor or pharmacy?: 1 - Never What is the last grade level you completed in school?: Bachelor's Degree  Diabetic?No/prediabetes  Interpreter Needed?: No  Information entered by :: [employer], [place]   Activities of Daily Living In your present state of health, do you have any difficulty performing the following activities: 04/21/2021 03/21/2021  Hearing? N N  Vision? N N  Difficulty concentrating or making decisions? N N  Walking or climbing stairs? N N  Dressing or bathing? N N  Doing errands, shopping? N N  Preparing Food and eating ? N -  Using the Toilet? N -  In the past six months, have you accidently leaked urine? N -  Do you have problems with loss of bowel control? N -  Managing your Medications? N -  Managing your Finances? N -  Housekeeping or managing your Housekeeping? N -  Some recent data might be hidden    Patient Care Team: Antony Blackbird, MD (Inactive) as PCP - General (Family Medicine) Mauro Kaufmann, RN as Oncology Nurse Navigator Rockwell Germany, RN as Oncology Nurse Navigator Truitt Merle, MD as Consulting Physician  (Hematology) Rolm Bookbinder, MD as Consulting Physician (General Surgery) Gery Pray, MD as Consulting Physician (Radiation Oncology)  Indicate any recent [place] you may have received from other than [facility] providers in the past year (date may be approximate).     Assessment:   This is a routine wellness examination for Terri Hawkins.  Hearing/Vision screen No results found.  Dietary issues and exercise activities discussed: Current Exercise Habits: Home exercise routine, Type of exercise: stretching;Other - see comments (PT and OT rehab excercises.), Frequency (Times/Week): 7, Intensity: Mild, Exercise limited by: Other - see comments (Recent Surgery)   Goals Addressed   None   Depression Screen PHQ 2/9 Scores 03/13/2021 02/22/2021 08/31/2019 02/18/2013  PHQ - 2 Score 0 0 0 0  PHQ- 9 Score 0 2 0 -    Fall Risk Fall Risk  04/21/2021 03/13/2021 02/18/2013  Falls in the past year? 0 0 No  Number falls in past yr: 0 0 -  Injury with Fall? 0 0 -  Risk for fall due to : No Fall Risks No Fall Risks -  Follow up Falls evaluation completed Falls evaluation completed -    FALL RISK PREVENTION PERTAINING TO THE HOME:  Any stairs in or around the home? No  If so, are there any without handrails?  N/A Home free of loose throw rugs in walkways, pet beds, electrical cords, etc? No  Adequate lighting in your home to reduce risk of falls? Yes   ASSISTIVE DEVICES UTILIZED TO PREVENT FALLS:  Life alert? No  Use of a cane, walker or w/c? No  Grab bars in the bathroom? Yes  Shower chair or bench in shower? No  Elevated toilet seat or a handicapped toilet? No   Cognitive Function:     6CIT Screen 04/21/2021  What Year? 0 points  What month? 0 points  What time? 0 points  Count back from 20 0 points  Months in reverse 0 points  Repeat phrase 0 points  Total Score 0    Immunizations Immunization History  Administered Date(s) Administered   Influenza,inj,Quad PF,6+ Mos  05/02/2019   PFIZER(Purple Top)SARS-COV-2 Vaccination 10/04/2019, 10/25/2019, 05/23/2020   Tdap 08/13/2007   Zoster, Live 04/23/2015  TDAP status: Due, Education has been provided regarding the importance of this vaccine. Advised may receive this vaccine at local pharmacy or Health Dept. Aware to provide a copy of the vaccination record if obtained from local pharmacy or Health Dept. Verbalized acceptance and understanding.  Flu Vaccine status: Due, Education has been provided regarding the importance of this vaccine. Advised may receive this vaccine at local pharmacy or Health Dept. Aware to provide a copy of the vaccination record if obtained from local pharmacy or Health Dept. Verbalized acceptance and understanding.  Pneumococcal vaccine status: Due, Education has been provided regarding the importance of this vaccine. Advised may receive this vaccine at local pharmacy or Health Dept. Aware to provide a copy of the vaccination record if obtained from local pharmacy or Health Dept. Verbalized acceptance and understanding.  Covid-19 vaccine status: Completed vaccines  Qualifies for Shingles Vaccine? Yes   Zostavax completed Yes   Shingrix Completed?: No.    Education has been provided regarding the importance of this vaccine. Patient has been advised to call insurance company to determine out of pocket expense if they have not yet received this vaccine. Advised may also receive vaccine at local pharmacy or Health Dept. Verbalized acceptance and understanding.  Screening Tests Health Maintenance  Topic Date Due   Hepatitis C Screening  Never done   Zoster Vaccines- Shingrix (1 of 2) Never done   PNA vac Low Risk Adult (1 of 2 - PCV13) Never done   TETANUS/TDAP  08/12/2017   COVID-19 Vaccine (4 - Booster for Pfizer series) 08/15/2020   INFLUENZA VACCINE  03/11/2021   MAMMOGRAM  01/17/2023   COLONOSCOPY (Pts 45-8yr Insurance coverage will need to be confirmed)  02/19/2023   DEXA SCAN   Completed   HPV VACCINES  Aged Out    Health Maintenance  Health Maintenance Due  Topic Date Due   Hepatitis C Screening  Never done   Zoster Vaccines- Shingrix (1 of 2) Never done   PNA vac Low Risk Adult (1 of 2 - PCV13) Never done   TETANUS/TDAP  08/12/2017   COVID-19 Vaccine (4 - Booster for [place]onseries) 08/15/2020   INFLUENZA VACCINE  03/11/2021    Patient stated that last Colonoscopy was completed 20 years ago. Patient stated that she will speak to oncologist about the need for another one.   Mammogram status: Completed 01/16/2021. Repeat every year  Bone Density status: Completed 03/28/2009. Results reflect: Bone density results: OSTEOPOROSIS. Repeat every 2 years.  Lung Cancer Screening: (Low Dose CT Chest recommended if Age 70-80years, 30 pack-year currently smoking OR have quit w/in 15years.) does not qualify.   Lung Cancer Screening Referral: N/A  Additional Screening:  Hepatitis C Screening: does qualify; Completed N/A  Vision Screening: Recommended annual ophthalmology exams for early detection of glaucoma and other disorders of the eye. Is the patient up to date with their annual eye exam?  No  Who is the provider or what is the name of the office in which the patient attends annual eye exams? SMarica Otter DO If pt is not established with a provider, would they like to be referred to a provider to establish care?  N/A .   Dental Screening: Recommended annual dental exams for proper oral hygiene  Community Resource Referral / Chronic Care Management: CRR required this visit?  No   CCM required this visit?  No      Plan:     I have personally reviewed and noted the following in  the patient's chart:   Medical and social history Use of alcohol, tobacco or illicit drugs  Current medications and supplements including opioid prescriptions. Patient is not currently taking opioid prescriptions. Functional ability and status Nutritional status Physical  activity Advanced directives List of other physicians Hospitalizations, surgeries, and ER visits in previous 12 months Vitals Screenings to include cognitive, depression, and falls Referrals and appointments  In addition, I have reviewed and discussed with patient certain preventive protocols, quality metrics, and best practice recommendations. A written personalized care plan for preventive services as well as general preventive health recommendations were provided to patient.     Tawni Pummel, CMA   04/21/2021   Nurse Notes: Non Face to Face, 45 minutes   Ms. Rinkenberger , Thank you for taking time to come for your Medicare Wellness Visit. I appreciate your ongoing commitment to your health goals. Please review the following plan we discussed and let me know if I can assist you in the future.   These are the goals we discussed:  Goals   None     This is a list of the screening recommended for you and due dates:  Health Maintenance  Topic Date Due   Hepatitis C Screening: USPSTF Recommendation to screen - Ages 37-79 yo.  Never done   Zoster (Shingles) Vaccine (1 of 2) Never done   Pneumonia vaccines (1 of 2 - PCV13) Never done   Tetanus Vaccine  08/12/2017   COVID-19 Vaccine (4 - Booster for Pfizer series) 08/15/2020   Flu Shot  03/11/2021   Mammogram  01/17/2023   Colon Cancer Screening  02/19/2023   DEXA scan (bone density measurement)  Completed   HPV Vaccine  Aged Out

## 2021-04-22 ENCOUNTER — Ambulatory Visit: Payer: Medicare Other

## 2021-04-23 ENCOUNTER — Ambulatory Visit: Payer: Medicare Other | Admitting: Rehabilitation

## 2021-04-23 ENCOUNTER — Encounter (HOSPITAL_COMMUNITY): Payer: Self-pay

## 2021-04-23 ENCOUNTER — Other Ambulatory Visit: Payer: Self-pay

## 2021-04-23 ENCOUNTER — Encounter: Payer: Self-pay | Admitting: Rehabilitation

## 2021-04-23 DIAGNOSIS — R293 Abnormal posture: Secondary | ICD-10-CM

## 2021-04-23 DIAGNOSIS — Z170 Estrogen receptor positive status [ER+]: Secondary | ICD-10-CM

## 2021-04-23 DIAGNOSIS — R600 Localized edema: Secondary | ICD-10-CM

## 2021-04-23 DIAGNOSIS — C50412 Malignant neoplasm of upper-outer quadrant of left female breast: Secondary | ICD-10-CM

## 2021-04-23 NOTE — Therapy (Signed)
[place], [place], [address] Phone: [phone]   Fax:  [phone]  Physical Therapy Treatment  Patient Details  Name: Terri Hawkins MRN: [id number] Date of Birth: 15-Mar-1951 Referring Provider (PT): Dr. Donne Hazel   Encounter Date: 04/23/2021   PT End of Session - 04/23/21 0951     Visit Number 3    Number of Visits 10    Date for PT Re-Evaluation 05/15/21    PT Start Time 0900    PT Stop Time 4076    PT Time Calculation (min) 48 min    Activity Tolerance Patient tolerated treatment well;Treatment limited secondary to medical complications (Comment)    Behavior During Therapy [facility]  for tasks assessed/performed             Past Medical History:  Diagnosis Date   Allergy 2016, I think   Yellow jacket sting   Breast cancer ([place]) 02/2021   Eczema    Family history of breast cancer 02/27/2021   Family history of glioblastoma 02/27/2021   Family history of melanoma 02/27/2021   Hyperlipidemia    Hypertension    Obesity    Pre-diabetes    Skin cancer Not sure of date   [facility]   Vitamin D deficiency     Past Surgical History:  Procedure Laterality Date   CESAREAN SECTION     x3   CHOLECYSTECTOMY     MASTECTOMY W/ SENTINEL NODE BIOPSY Left 03/27/2021   Procedure: LEFT MASTECTOMY WITH LEFT AXILLARY SENTINEL LYMPH NODE BIOPSY;  Surgeon: Rolm Bookbinder, MD;  Location: [place];  Service: General;  Laterality: Left;   TONSILLECTOMY     TUBAL LIGATION     Not sure of date    There were no vitals filed for this visit.   Subjective Assessment - 04/23/21 0900     Pertinent History Suspicious area found with screening mammogram.  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  She started neoadjuvant Anastrazole, but started having some sideeffects so is to discontinue for a week. Left mastectomy with SLNB  performed on  03/27/2021. Drain removed on 04/05/2021    Currently in Pain? No/denies                               [facility] Adult PT Treatment/Exercise - 04/23/21 0001       Manual Therapy   Manual Therapy Passive ROM;Manual Lymphatic Drainage (MLD);Edema management    Edema Management checked pts new bra - fits well.  Added rectangle foam for lateral trunk    Manual Lymphatic Drainage (MLD) started MLD with education for the left lateral trunk and axilla - short neck, superficial and deep abdominals, left axillary nodes, sternal nodes, lt inguinal nodes, then left upper quadrant and lateral trunk focus    Passive ROM to the left UE into flexion but noticed T junction spot on incision pulling open so limited PROM.                          PT Long Term Goals - 04/17/21 1352       PT LONG TERM GOAL #1   Title Pt will have bilateral shoulder ROM returned to WNL    Time 4    Period Weeks    Status New      PT LONG  TERM GOAL #2   Title Pt will be independent in a HEP to improve shoulder ROM /strength    Time 4    Period Weeks    Status New    Target Date 05/15/21      PT LONG TERM GOAL #3   Title Pt will attend ABC class and understand precautions for lymphedema    Time 4    Period Weeks    Status New    Target Date 05/15/21      PT LONG TERM GOAL #4   Title Pt will have decreased complaints of tightness by atleast 50%    Time 4    Period Weeks    Status New    Target Date 05/15/21      PT LONG TERM GOAL #5   Title Pt will be independent in MLD prn to decrease left chest swelling    Time 4    Period Weeks    Status New    Target Date 05/15/21                   Plan - 04/23/21 0951     Clinical Impression Statement Started PROM today but noticed opening of mid T junction incision with overhead flexion above 90degrees.  Limited PROM to no pull of incision and instructed pt to limit over 90deg for the next 2 days and we will  recheck on Thursday.  Pt will also apply neosporin.  No cording noted today but did not tightness in the pectoralis and edema vs skin lateral incision and trunk.  Started MLD for the region and added foam to bra.    PT Frequency 2x / week    PT Duration 4 weeks    PT Treatment/Interventions ADLs/Self Care Home Management;Therapeutic exercise;Patient/family education;Orthotic Fit/Training;Manual lymph drainage;Manual techniques;Scar mobilization;Passive range of motion    PT Next Visit Plan how is incision with overhead flexion? cont exercises/HEP vs holding,  try supine wand exs, MFR to cording at axilla, PROM, MLD and pt instruction,scar mobs when ready    Consulted and Agree with Plan of Care Patient             Patient will benefit from skilled therapeutic intervention in order to improve the following deficits and impairments:  Decreased knowledge of precautions, Postural dysfunction, Decreased scar mobility, Decreased range of motion, Increased edema, Pain, Decreased skin integrity, Impaired flexibility  Visit Diagnosis: Malignant neoplasm of upper-outer quadrant of left breast in female, estrogen receptor positive ([place])  Abnormal posture  Localized edema     Problem List Patient Active Problem List   Diagnosis Date Noted   S/P mastectomy, left 03/27/2021   Genetic testing 03/12/2021   Family history of breast cancer 02/27/2021   Family history of melanoma 02/27/2021   Family history of glioblastoma 02/27/2021   Malignant neoplasm of upper-outer quadrant of left breast in female, estrogen receptor positive ([place]) 02/22/2021   Essential hypertension, benign 01/24/2011   Hyperlipidemia 01/24/2011   Unspecified vitamin D deficiency 01/24/2011    Stark Bray, PT 04/23/2021, 9:54 AM  [place], [place], [address] Phone: [phone]   Fax:  [phone]  Name: Terri Hawkins MRN:  [id number] Date of Birth: 04-21-1951

## 2021-04-25 ENCOUNTER — Encounter: Payer: Self-pay | Admitting: Physical Therapy

## 2021-04-25 ENCOUNTER — Ambulatory Visit: Payer: Medicare Other | Admitting: Physical Therapy

## 2021-04-25 ENCOUNTER — Other Ambulatory Visit: Payer: Self-pay

## 2021-04-25 DIAGNOSIS — Z170 Estrogen receptor positive status [ER+]: Secondary | ICD-10-CM

## 2021-04-25 DIAGNOSIS — C50412 Malignant neoplasm of upper-outer quadrant of left female breast: Secondary | ICD-10-CM | POA: Diagnosis not present

## 2021-04-25 DIAGNOSIS — R293 Abnormal posture: Secondary | ICD-10-CM

## 2021-04-25 DIAGNOSIS — R600 Localized edema: Secondary | ICD-10-CM

## 2021-04-25 NOTE — Therapy (Signed)
Terri Hawkins, [place], [address] Phone: [phone]   Fax:  [phone]  Physical Therapy Treatment  Patient Details  Name: Terri Hawkins MRN: [id number] Date of Birth: 06/20/1951 Referring Provider (PT): Dr. Donne Hazel   Encounter Date: 04/25/2021   PT End of Session - 04/25/21 1159     Visit Number 4    Number of Visits 10    Date for PT Re-Evaluation 05/15/21    PT Start Time 8110    PT Stop Time 3159    PT Time Calculation (min) 55 min    Activity Tolerance Patient tolerated treatment well    Behavior During Therapy [facility] for tasks assessed/performed             Past Medical History:  Diagnosis Date   Allergy 2016, I think   Yellow jacket sting   Breast cancer ([place]ri [place]) 02/2021   Eczema    Family history of breast cancer 02/27/2021   Family history of glioblastoma 02/27/2021   Family history of melanoma 02/27/2021   Hyperlipidemia    Hypertension    Obesity    Pre-diabetes    Skin cancer Not sure of date   [facility]   Vitamin D deficiency     Past Surgical History:  Procedure Laterality Date   CESAREAN SECTION     x3   CHOLECYSTECTOMY     MASTECTOMY W/ SENTINEL NODE BIOPSY Left 03/27/2021   Procedure: LEFT MASTECTOMY WITH LEFT AXILLARY SENTINEL LYMPH NODE BIOPSY;  Surgeon: Rolm Bookbinder, MD;  Location: [place];  Service: General;  Laterality: Left;   TONSILLECTOMY     TUBAL LIGATION     Not sure of date    There were no vitals filed for this visit.   Subjective Assessment - 04/25/21 1058     Subjective I worked outside in the yard yesterday and did not notice any drainge from the wound. I put neosporin on it. My arm feels fine. I am still sore under my armpit. The foam is ok I am wearing it right now.    Pertinent History Suspicious area found with screening mammogram.  02/19/2021 biopsy was performed and determined to be Invasive Ductal Carcinoma ER+,PR+, HER2-  with a Ki67 of 5%.  She started neoadjuvant Anastrazole, but started having some sideeffects so is to discontinue for a week. Left mastectomy with SLNB performed on  03/27/2021. Drain removed on 04/05/2021    Patient Stated Goals post-op reassessment    Currently in Pain? No/denies    Pain Score 0-No pain                               [facility] Adult PT Treatment/Exercise - 04/25/21 0001       Shoulder Exercises: Supine   Flexion AAROM;Both;10 reps   with 5 sec holds using dowel   ABduction AAROM;Left;10 reps   with 5 sec holds using dowel     Manual Therapy   Manual Lymphatic Drainage (MLD) short neck, L inguinal nodes and L trunk moving fluid towards pathway    Passive ROM to L UE in to flexion, abduction and ER - no opening of scar observed today during PROM                          PT Long Term Goals - 04/17/21 1352       PT LONG TERM  GOAL #1   Title Pt will have bilateral shoulder ROM returned to WNL    Time 4    Period Weeks    Status New      PT LONG TERM GOAL #2   Title Pt will be independent in a HEP to improve shoulder ROM /strength    Time 4    Period Weeks    Status New    Target Date 05/15/21      PT LONG TERM GOAL #3   Title Pt will attend ABC class and understand precautions for lymphedema    Time 4    Period Weeks    Status New    Target Date 05/15/21      PT LONG TERM GOAL #4   Title Pt will have decreased complaints of tightness by atleast 50%    Time 4    Period Weeks    Status New    Target Date 05/15/21      PT LONG TERM GOAL #5   Title Pt will be independent in MLD prn to decrease left chest swelling    Time 4    Period Weeks    Status New    Target Date 05/15/21                   Plan - 04/25/21 1200     Clinical Impression Statement Pt's wound has closed and did not demonstrate any opening with PROM today. Issued supine dowel exercises for pt to do at home in to flexion and abduction to improve  L shoulder ROM. Also continued with MLD to L lateral trunk. Pt reports her swelling is improving and she has been wearing the foam in her compression bra. Will continue to work on PROM to improve L shoulder ROM.    PT Frequency 2x / week    PT Duration 4 weeks    PT Treatment/Interventions ADLs/Self Care Home Management;Therapeutic exercise;Patient/family education;Orthotic Fit/Training;Manual lymph drainage;Manual techniques;Scar mobilization;Passive range of motion    PT Next Visit Plan how is incision?, instruct in supine scap, MFR to cording at axilla, PROM, MLD and pt instruction,scar mobs when ready    PT Home Exercise Plan continue 4 post op exs 2-3 times /day, supine dowel exercises    Consulted and Agree with Plan of Care Patient             Patient will benefit from skilled therapeutic intervention in order to improve the following deficits and impairments:  Decreased knowledge of precautions, Postural dysfunction, Decreased scar mobility, Decreased range of motion, Increased edema, Pain, Decreased skin integrity, Impaired flexibility  Visit Diagnosis: Localized edema  Abnormal posture  Malignant neoplasm of upper-outer quadrant of left breast in female, estrogen receptor positive ([place])     Problem List Patient Active Problem List   Diagnosis Date Noted   S/P mastectomy, left 03/27/2021   Genetic testing 03/12/2021   Family history of breast cancer 02/27/2021   Family history of melanoma 02/27/2021   Family history of glioblastoma 02/27/2021   Malignant neoplasm of upper-outer quadrant of left breast in female, estrogen receptor positive ([place]) 02/22/2021   Essential hypertension, benign 01/24/2011   Hyperlipidemia 01/24/2011   Unspecified vitamin D deficiency 01/24/2011    Terri Hawkins, PT 04/25/2021, 12:03 PM  [place], [place], [address] Phone: [phone]   Fax:   [phone]  Name: Terri Hawkins MRN: [id number] Date of Birth: 1951-03-25  Terri Hawkins,  PT 04/25/21 12:04 PM

## 2021-04-25 NOTE — Patient Instructions (Signed)
Shoulder: Flexion (Supine)    With hands shoulder width apart, slowly lower dowel to floor behind head. Do not let elbows bend. Keep back flat. Hold _5-10___ seconds. Repeat __10__ times. Do __2__ sessions per day. CAUTION: Stretch slowly and gently.  Copyright  VHI. All rights reserved.  Shoulder: Abduction (Supine)    With left arm flat on floor, hold dowel in palm. Slowly move arm up to side of head by pushing with opposite arm. Do not let elbow bend. Hold _5-10___ seconds. Repeat _10___ times. Do __2__ sessions per day. CAUTION: Stretch slowly and gently.  Copyright  VHI. All rights reserved.

## 2021-04-30 ENCOUNTER — Other Ambulatory Visit: Payer: Self-pay

## 2021-04-30 ENCOUNTER — Ambulatory Visit: Payer: Medicare Other

## 2021-04-30 DIAGNOSIS — C50412 Malignant neoplasm of upper-outer quadrant of left female breast: Secondary | ICD-10-CM

## 2021-04-30 DIAGNOSIS — Z170 Estrogen receptor positive status [ER+]: Secondary | ICD-10-CM

## 2021-04-30 DIAGNOSIS — R600 Localized edema: Secondary | ICD-10-CM

## 2021-04-30 DIAGNOSIS — R293 Abnormal posture: Secondary | ICD-10-CM

## 2021-04-30 NOTE — Patient Instructions (Signed)

## 2021-04-30 NOTE — Therapy (Signed)
[place], [place], [address] Phone: [phone]   Fax:  [phone]  Physical Therapy Treatment  Patient Details  Name: Terri Hawkins MRN: [id number] Date of Birth: Dec 01, 1950 Referring Provider (PT): Dr. Donne Hazel   Encounter Date: 04/30/2021   PT End of Session - 04/30/21 1006     Visit Number 5    Number of Visits 10    Date for PT Re-Evaluation 05/15/21    PT Start Time 0910    PT Stop Time 1005    PT Time Calculation (min) 55 min    Activity Tolerance Patient tolerated treatment well    Behavior During Therapy [facility] for tasks assessed/performed             Past Medical History:  Diagnosis Date   Allergy 2016, I think   Yellow jacket sting   Breast cancer ([place]) 02/2021   Eczema    Family history of breast cancer 02/27/2021   Family history of glioblastoma 02/27/2021   Family history of melanoma 02/27/2021   Hyperlipidemia    Hypertension    Obesity    Pre-diabetes    Skin cancer Not sure of date   [facility]   Vitamin D deficiency     Past Surgical History:  Procedure Laterality Date   CESAREAN SECTION     x3   CHOLECYSTECTOMY     MASTECTOMY W/ SENTINEL NODE BIOPSY Left 03/27/2021   Procedure: LEFT MASTECTOMY WITH LEFT AXILLARY SENTINEL LYMPH NODE BIOPSY;  Surgeon: Rolm Bookbinder, MD;  Location: [place];  Service: General;  Laterality: Left;   TONSILLECTOMY     TUBAL LIGATION     Not sure of date    There were no vitals filed for this visit.   Subjective Assessment - 04/30/21 0914     Subjective I think the incision is continuing to do well. It's not draining or open anymore. I've started having some soreness at hte lateral aspect of my upper arm with reaching (pt demonstrates compensation with end A/ROM reaching).    Pertinent History Suspicious area found with screening mammogram.  02/19/2021 biopsy was performed and determined to be Invasive Ductal  Carcinoma ER+,PR+, HER2- with a Ki67 of 5%.  She started neoadjuvant Anastrazole, but started having some sideeffects so is to discontinue for a week. Left mastectomy with SLNB performed on  03/27/2021. Drain removed on 04/05/2021    Patient Stated Goals post-op reassessment    Currently in Pain? No/denies                               [facility] Adult PT Treatment/Exercise - 04/30/21 0001       Shoulder Exercises: Supine   Horizontal ABduction Strengthening;Both;10 reps;Theraband    Theraband Level (Shoulder Horizontal ABduction) Level 1 (Yellow)    Horizontal ABduction Limitations Pt returned therapist demo for each of supine scapular exs with VCs for correct UE positioning    External Rotation Strengthening;Both;10 reps;Theraband   placed folded washcloth at Lt elbow for cuing to keep albow adducted as pt struggled with this   Theraband Level (Shoulder External Rotation) Level 1 (Yellow)    Flexion Strengthening;Both;5 reps;10 reps;Theraband   5 reps with narrow grip then stopped due to posterior shoulder pain, 10 reps of wide grip   Theraband Level (Shoulder Flexion) Level 1 (Yellow)    Diagonals Strengthening;Right;Left;10 reps;Theraband    Theraband Level (Shoulder Diagonals) Level 1 (Yellow)  Shoulder Exercises: Pulleys   Flexion 2 minutes    Flexion Limitations Demo and tactile cues to decrease Lt scapular compensation which was challenging for pt but with cuing she was able to correct    ABduction 2 minutes    ABduction Limitations Demo and VCs to decrease Lt scapular compensation      Manual Therapy   Manual Therapy Myofascial release;Manual Lymphatic Drainage (MLD);Passive ROM    Myofascial Release To Lt axilla during P/ROM but also blocking at incision with end P/ROM    Manual Lymphatic Drainage (MLD) short neck, L inguinal nodes and L trunk moving fluid towards pathway    Passive ROM to L UE in to flexion, abduction and ER - no opening of scar observed  today during PROM                     PT Education - 04/30/21 1006     Education Details Supine scapular series with yellow theraband    Person(s) Educated Patient    Methods Explanation;Demonstration;Handout    Comprehension Verbalized understanding;Returned demonstration;Verbal cues required;Need further instruction                 PT Long Term Goals - 04/17/21 1352       PT LONG TERM GOAL #1   Title Pt will have bilateral shoulder ROM returned to WNL    Time 4    Period Weeks    Status New      PT LONG TERM GOAL #2   Title Pt will be independent in a HEP to improve shoulder ROM /strength    Time 4    Period Weeks    Status New    Target Date 05/15/21      PT LONG TERM GOAL #3   Title Pt will attend ABC class and understand precautions for lymphedema    Time 4    Period Weeks    Status New    Target Date 05/15/21      PT LONG TERM GOAL #4   Title Pt will have decreased complaints of tightness by atleast 50%    Time 4    Period Weeks    Status New    Target Date 05/15/21      PT LONG TERM GOAL #5   Title Pt will be independent in MLD prn to decrease left chest swelling    Time 4    Period Weeks    Status New    Target Date 05/15/21                   Plan - 04/30/21 1006     Clinical Impression Statement Continued with manual therapy working to improve her end Lt shoulder P/ROM while preventing pull at still healing incision (but this is still closed). Then progressed pt to include supine scapular series which she tolerated well except had posterior shoulder pain with narrow grip flexion so instructed pt to hold off on this for now and she verbalized understanding. Also added pulleys to work on decreasing Lt scapular compensation which pt struggles with due to muscle guarding.    Personal Factors and Comorbidities Comorbidity 1    Comorbidities Left mastectomy with SLNB, no rad or chemo    Stability/Clinical Decision Making  Stable/Uncomplicated    Rehab Potential Excellent    PT Frequency 2x / week    PT Duration 4 weeks    PT Treatment/Interventions ADLs/Self Care Home Management;Therapeutic exercise;Patient/family education;Orthotic Fit/Training;Manual  lymph drainage;Manual techniques;Scar mobilization;Passive range of motion    PT Next Visit Plan how is incision?, review supine scap, cont pulleys and add ball roll up wall, MFR to cording at axilla, PROM, MLD and pt instruction,scar mobs when ready    PT Home Exercise Plan continue 4 post op exs 2-3 times /day, supine dowel exercises; supine scapular series    Consulted and Agree with Plan of Care Patient             Patient will benefit from skilled therapeutic intervention in order to improve the following deficits and impairments:  Decreased knowledge of precautions, Postural dysfunction, Decreased scar mobility, Decreased range of motion, Increased edema, Pain, Decreased skin integrity, Impaired flexibility  Visit Diagnosis: Localized edema  Abnormal posture  Malignant neoplasm of upper-outer quadrant of left breast in female, estrogen receptor positive ([place])     Problem List Patient Active Problem List   Diagnosis Date Noted   S/P mastectomy, left 03/27/2021   Genetic testing 03/12/2021   Family history of breast cancer 02/27/2021   Family history of melanoma 02/27/2021   Family history of glioblastoma 02/27/2021   Malignant neoplasm of upper-outer quadrant of left breast in female, estrogen receptor positive ([place]) 02/22/2021   Essential hypertension, benign 01/24/2011   Hyperlipidemia 01/24/2011   Unspecified vitamin D deficiency 01/24/2011    Otelia Limes, PTA 04/30/2021, 10:11 AM  [place], [place], [address] Phone: [phone]   Fax:  [phone]  Name: Terri Hawkins MRN: [id number] Date of Birth: 04-16-1951

## 2021-05-02 ENCOUNTER — Other Ambulatory Visit: Payer: Self-pay

## 2021-05-02 ENCOUNTER — Encounter: Payer: Self-pay | Admitting: Hematology

## 2021-05-02 ENCOUNTER — Inpatient Hospital Stay: Payer: Medicare Other | Attending: Hematology | Admitting: Hematology

## 2021-05-02 ENCOUNTER — Encounter: Payer: Self-pay | Admitting: *Deleted

## 2021-05-02 VITALS — BP 147/69 | HR 73 | Temp 98.1°F | Resp 17 | Ht 64.0 in | Wt 182.5 lb

## 2021-05-02 DIAGNOSIS — E785 Hyperlipidemia, unspecified: Secondary | ICD-10-CM | POA: Diagnosis not present

## 2021-05-02 DIAGNOSIS — Z170 Estrogen receptor positive status [ER+]: Secondary | ICD-10-CM | POA: Insufficient documentation

## 2021-05-02 DIAGNOSIS — I10 Essential (primary) hypertension: Secondary | ICD-10-CM | POA: Diagnosis not present

## 2021-05-02 DIAGNOSIS — Z79811 Long term (current) use of aromatase inhibitors: Secondary | ICD-10-CM | POA: Diagnosis not present

## 2021-05-02 DIAGNOSIS — C50412 Malignant neoplasm of upper-outer quadrant of left female breast: Secondary | ICD-10-CM | POA: Insufficient documentation

## 2021-05-02 DIAGNOSIS — Z79899 Other long term (current) drug therapy: Secondary | ICD-10-CM | POA: Insufficient documentation

## 2021-05-02 DIAGNOSIS — Z9012 Acquired absence of left breast and nipple: Secondary | ICD-10-CM | POA: Insufficient documentation

## 2021-05-02 NOTE — Progress Notes (Signed)
[place]   [phone] [phone]   Clinic Follow up Note   Patient Care Team: Antony Blackbird, MD (Inactive) as PCP - General (Family Medicine) Mauro Kaufmann, RN as Oncology Nurse Navigator Rockwell Germany, RN as Oncology Nurse Navigator Truitt Merle, MD as Consulting Physician (Hematology) Rolm Bookbinder, MD as Consulting Physician (General Surgery) Gery Pray, MD as Consulting Physician (Radiation Oncology)  Date of Service:  05/02/2021  CHIEF COMPLAINT: f/u of left breast cancer  CURRENT THERAPY:  Anastrozole, started preoperatively on 02/27/21  ASSESSMENT & PLAN:  Terri Hawkins is a 70 y.o. female with   1. Malignant neoplasm of upper-outer quadrant of left breast, Invasive Ductal Carcinoma, Stage IB , pT2N0 cM0, ER+/PR+/HER2-, Grade 2  -left breast mass found on screening mammogram 01/2021. Biopsy on 02/19/21 showed grade 2 IDC at 12 o'clock and DCIS at 2 o'clock, ER+, PR+, Her2- by FISH. -she started anastrozole preoperatively on 02/27/21 -left mastectomy on 03/27/21 with Dr. Donne Hazel showed: 2.1 cm grade 2 IDC; focal DCIS, two intraductal papilloma; negative margins and lymph nodes. -Oncotype RS of 1, predicting a 3% risk of outside metastasis. I reviewed with her. Adjuvant chemo is not recommended  -she is recovering well from surgery and tolerating anastrozole relatively well with hot flashes and some joint stiffness, overall mild  -we will schedule her for survivorship visit in 3 months with office f/u in 6 months.  2. Genetics -testing done 02/27/21. Results were negative with VUS in CHEK2.  3. Bone Health -last on file in 2010. She is scheduled for repeat in 08/16/21.     PLAN:  -continue anastrozole 1 mg daily -Survivorship in 3 months -lab and f/u in 6 months   No problem-specific Assessment & Plan notes found for this encounter.   SUMMARY OF ONCOLOGIC HISTORY: Oncology History Overview Note  Cancer  Staging Malignant neoplasm of upper-outer quadrant of left breast in female, estrogen receptor positive ([place]) Staging form: Breast, AJCC 8th Edition - Clinical stage from 02/19/2021: Stage IB (cT2, cN0, cM0, G2, ER+, PR+, HER2-) - Signed by Truitt Merle, MD on 02/26/2021 Stage prefix: Initial diagnosis Histologic grading system: 3 grade system    Malignant neoplasm of upper-outer quadrant of left breast in female, estrogen receptor positive ([place])  01/16/2021 Mammogram   DIGITAL SCREENING BILATERAL MAMMOGRAM WITH TOMOSYNTHESIS AND CAD  IMPRESSION: Further evaluation is suggested for a possible mass in the left breast.   02/08/2021 Mammogram   DIGITAL DIAGNOSTIC UNILATERAL LEFT MAMMOGRAM WITH TOMOSYNTHESIS AND CAD; ULTRASOUND LEFT BREAST LIMITED  IMPRESSION: 1. Highly suspicious left breast mass at the 12 o'clock position corresponding with the screening mammographic findings. It measures 2.6 x 2.1 x 1.5 cm. Recommendation is for ultrasound-guided biopsy. 2. Two additional indeterminate left breast masses at the 2 o'clock position 6 cm and 7 cm from the nipple. One of these likely corresponds with an additional mammographic mass. Recommendation is for ultrasound-guided biopsy. 3. No suspicious left axillary lymphadenopathy.     02/19/2021 Pathology Results   Diagnosis 1. Breast, left, needle core biopsy, 2 o'clock 6 cmfn (ribbon) - INTRADUCTAL PAPILLOMA WITHOUT ATYPIA 2. Breast, left, needle core biopsy, 2 o'clock 7 cmfn (coil) - DUCTAL CARCINOMA IN-SITU ARISING IN INTRADUCTAL PAPILLOMA - SEE COMMENT 3. Breast, left, needle core biopsy, 12 o'clock (heart) - INVASIVE DUCTAL CARCINOMA - SEE COMMENT Microscopic Comment 3. Based on the biopsy, the carcinoma appears Nottingham grade 2 of 3 and measures 0.7 cm in greatest linear extent.  3. PROGNOSTIC INDICATORS  Results: IMMUNOHISTOCHEMICAL AND MORPHOMETRIC ANALYSIS PERFORMED MANUALLY The tumor cells are EQUIVOCAL for Her2 (2+). Her2 by  FISH will be performed and results reported separately. Estrogen Receptor: 95%, POSITIVE, STRONG STAINING INTENSITY Progesterone Receptor: 95%, POSITIVE, STRONG STAINING INTENSITY Proliferation Marker Ki67: 5%  3. FLUORESCENCE IN-SITU HYBRIDIZATION Results: GROUP 5: HER2 **NEGATIVE** Equivocal form of amplification of the HER2 gene was detected in the IHC 2+ tissue sample received from this individual. HER2 FISH was performed by a technologist and cell imaging and analysis on the [employer]. RATIO OF HER2/CEN17 SIGNALS 1.43 AVERAGE HER2 COPY NUMBER PER CELL 2.00   02/19/2021 Cancer Staging   Staging form: Breast, AJCC 8th Edition - Clinical stage from 02/19/2021: Stage IB (cT2, cN0, cM0, G2, ER+, PR+, HER2-) - Signed by Truitt Merle, MD on 02/26/2021 Stage prefix: Initial diagnosis Histologic grading system: 3 grade system   02/22/2021 Initial Diagnosis   Malignant neoplasm of upper-outer quadrant of left breast in female, estrogen receptor positive ([place])   03/05/2021 Genetic Testing   No pathogenic variants detected in [facility] BRCAPlus Panel and [facility] CancerNext-Expanded +RNAinsight Panel.  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  Of note, at the time of testing, [facility] classified this variant as uncertain; however, other laboratories classify this as likely pathogenic.  The report dates are March 05, 2021 and March 18, 2021.   The BRCAplus panel offered by [employer] and includes sequencing and deletion/duplication analysis for the following 8 genes: ATM, BRCA1, BRCA2, CDH1, CHEK2, PALB2, PTEN, and TP53.  The CancerNext-Expanded gene panel offered by [facility] and includes sequencing, rearrangement, and RNA analysis for the following 77 genes: AIP, ALK, APC, ATM, AXIN2, BAP1, BARD1, BLM, BMPR1A, BRCA1, BRCA2, BRIP1, CDC73, CDH1, CDK4, CDKN1B, CDKN2A, CHEK2, CTNNA1, DICER1, FANCC, FH, FLCN, GALNT12, KIF1B, LZTR1, MAX, MEN1, MET, MLH1, MSH2, MSH3, MSH6, MUTYH,  NBN, NF1, NF2, NTHL1, PALB2, PHOX2B, PMS2, POT1, PRKAR1A, PTCH1, PTEN, RAD51C, RAD51D, RB1, RECQL, RET, SDHA, SDHAF2, SDHB, SDHC, SDHD, SMAD4, SMARCA4, SMARCB1, SMARCE1, STK11, SUFU, TMEM127, TP53, TSC1, TSC2, VHL and XRCC2 (sequencing and deletion/duplication); EGFR, EGLN1, HOXB13, KIT, MITF, PDGFRA, POLD1, and POLE (sequencing only); EPCAM and GREM1 (deletion/duplication only).    03/27/2021 Cancer Staging   Staging form: Breast, AJCC 8th Edition - Pathologic stage from 03/27/2021: Stage IA (pT2, pN0, cM0, G2, ER+, PR+, HER2-) - Signed by Truitt Merle, MD on 05/01/2021 Stage prefix: Initial diagnosis Histologic grading system: 3 grade system Residual tumor (R): R0 - None      INTERVAL HISTORY:  LYNCOLN MASKELL is here for a follow up of breast cancer. She was last seen by me on 02/27/21 in consultation. She presents to the clinic alone. She reports some hot flashes and joint pain from anastrozole. She notes the joint pain is not worse from baseline at this point.   All other systems were reviewed with the patient and are negative.  MEDICAL HISTORY:  Past Medical History:  Diagnosis Date   Allergy 2016, I think   Yellow jacket sting   Breast cancer ([place]) 02/2021   Eczema    Family history of breast cancer 02/27/2021   Family history of glioblastoma 02/27/2021   Family history of melanoma 02/27/2021   Hyperlipidemia    Hypertension    Obesity    Pre-diabetes    Skin cancer Not sure of date   [facility]   Vitamin D deficiency     SURGICAL HISTORY: Past Surgical History:  Procedure Laterality Date   CESAREAN SECTION  x3   CHOLECYSTECTOMY     MASTECTOMY W/ SENTINEL NODE BIOPSY Left 03/27/2021   Procedure: LEFT MASTECTOMY WITH LEFT AXILLARY SENTINEL LYMPH NODE BIOPSY;  Surgeon: Rolm Bookbinder, MD;  Location: [place];  Service: General;  Laterality: Left;   TONSILLECTOMY     TUBAL LIGATION     Not sure of date    I have reviewed the social history and family  history with the patient and they are unchanged from previous note.  ALLERGIES:  is allergic to zocor [simvastatin], wasp venom protein, and yellow jacket venom [bee venom].  MEDICATIONS:  Current Outpatient Medications  Medication Sig Dispense Refill   acetaminophen (TYLENOL) 500 MG tablet Take 500 mg by mouth every 6 (six) hours as needed for moderate pain or headache.     amLODipine (NORVASC) 5 MG tablet Take 1 tablet (5 mg total) by mouth daily. 90 tablet 0   anastrozole (ARIMIDEX) 1 MG tablet Take 1 tablet (1 mg total) by mouth daily. 30 tablet 3   Cholecalciferol 25 MCG (1000 UT) CHEW Chew 1,000 Units by mouth daily.     clotrimazole (LOTRIMIN AF) 1 % cream Apply 1 application topically 2 (two) times daily. 45 g 1   melatonin 3 MG TABS tablet Take 6 mg by mouth at bedtime as needed (sleep).     methocarbamol (ROBAXIN) 500 MG tablet Take 1 tablet (500 mg total) by mouth every 6 (six) hours as needed for muscle spasms. 30 tablet 0   Multiple Vitamin (MULTIVITAMIN) capsule Take 1 capsule by mouth daily.     oxyCODONE (OXY IR/ROXICODONE) 5 MG immediate release tablet Take 1 tablet (5 mg total) by mouth every 4 (four) hours as needed for moderate pain. 10 tablet 0   triamcinolone cream (KENALOG) 0.1 % Apply 1 application topically 2 (two) times daily. 45 g 1   No current facility-administered medications for this visit.    PHYSICAL EXAMINATION: ECOG PERFORMANCE STATUS: 0 - Asymptomatic  Vitals:   05/02/21 1159  BP: (!) 147/69  Pulse: 73  Resp: 17  Temp: 98.1 F (36.7 C)  SpO2: 97%   Wt Readings from Last 3 Encounters:  05/02/21 182 lb 8 oz (82.8 kg)  03/27/21 185 lb (83.9 kg)  03/21/21 185 lb 6.4 oz (84.1 kg)     GENERAL:alert, no distress and comfortable SKIN: skin color, texture, turgor are normal, no rashes or significant lesions EYES: normal, Conjunctiva are pink and non-injected, sclera clear  NECK: supple, thyroid normal size, non-tender, without nodularity LYMPH:   no palpable lymphadenopathy in the cervical, axillary  LUNGS: clear to auscultation and percussion with normal breathing effort HEART: regular rate & rhythm and no murmurs and no lower extremity edema ABDOMEN:abdomen soft, non-tender and normal bowel sounds Musculoskeletal:no cyanosis of digits and no clubbing  NEURO: alert & oriented x 3 with fluent speech, no focal motor/sensory deficits BREAST:  No palpable mass, nodules or adenopathy bilaterally. Breast exam benign.   LABORATORY DATA:  I have reviewed the data as listed CBC Latest Ref Rng & Units 02/27/2021 08/18/2013 08/16/2012  WBC 4.0 - 10.5 K/uL 7.3 8.8 9.8  Hemoglobin 12.0 - 15.0 g/dL 12.9 13.8 13.5  Hematocrit 36.0 - 46.0 % 39.7 41.5 40.1  Platelets 150 - 400 K/uL 247 312 333     CMP Latest Ref Rng & Units 02/27/2021 02/22/2021 05/02/2019  Glucose 70 - 99 mg/dL 105(H) 87 86  BUN 8 - 23 mg/dL _0 Creatinine 0.44 - 1.00 mg/dL 0.84 0.86  0.88  Sodium 135 - 145 mmol/L 140 137 139  Potassium 3.5 - 5.1 mmol/L 4.3 4.5 4.3  Chloride 98 - 111 mmol/L 105 98 100  CO2 22 - 32 mmol/L _0 Calcium 8.9 - 10.3 mg/dL 9.7 9.7 10.0  Total Protein 6.5 - 8.1 g/dL 7.3 - 7.3  Total Bilirubin 0.3 - 1.2 mg/dL 0.6 - 0.4  Alkaline Phos 38 - 126 U/L 80 - 91  AST 15 - 41 U/L 19 - 21  ALT 0 - 44 U/L 24 - 26      RADIOGRAPHIC STUDIES: I have personally reviewed the radiological images as listed and agreed with the findings in the report. No results found.    No orders of the defined types were placed in this encounter.  All questions were answered. The patient knows to call the clinic with any problems, questions or concerns. No barriers to learning was detected. The total time spent in the appointment was 30 minutes.     Truitt Merle, MD 05/02/2021   I, Wilburn Mylar, am acting as scribe for Truitt Merle, MD.   I have reviewed the above documentation for accuracy and completeness, and I agree with the above.

## 2021-05-03 ENCOUNTER — Ambulatory Visit: Payer: Medicare Other | Admitting: Rehabilitation

## 2021-05-03 ENCOUNTER — Encounter: Payer: Self-pay | Admitting: Rehabilitation

## 2021-05-03 DIAGNOSIS — R293 Abnormal posture: Secondary | ICD-10-CM

## 2021-05-03 DIAGNOSIS — Z170 Estrogen receptor positive status [ER+]: Secondary | ICD-10-CM

## 2021-05-03 DIAGNOSIS — R600 Localized edema: Secondary | ICD-10-CM

## 2021-05-03 DIAGNOSIS — C50412 Malignant neoplasm of upper-outer quadrant of left female breast: Secondary | ICD-10-CM | POA: Diagnosis not present

## 2021-05-03 NOTE — Therapy (Signed)
[place], [place], [address] Phone: [phone]   Fax:  [phone]  Physical Therapy Treatment  Patient Details  Name: Terri Hawkins MRN: [id number] Date of Birth: [birth year] Referring Provider (PT): Dr. Donne Hazel   Encounter Date: 05/03/2021   PT End of Session - 05/03/21 0848     Visit Number 6    Number of Visits 10    PT Start Time 0801    PT Stop Time 6962    PT Time Calculation (min) 45 min    Activity Tolerance Patient tolerated treatment well    Behavior During Therapy [facility] for tasks assessed/performed             Past Medical History:  Diagnosis Date   Allergy 2016, I think   Yellow jacket sting   Breast cancer ([place]) 02/2021   Eczema    Family history of breast cancer 02/27/2021   Family history of glioblastoma 02/27/2021   Family history of melanoma 02/27/2021   Hyperlipidemia    Hypertension    Obesity    Pre-diabetes    Skin cancer Not sure of date   [facility]   Vitamin D deficiency     Past Surgical History:  Procedure Laterality Date   CESAREAN SECTION     x3   CHOLECYSTECTOMY     MASTECTOMY W/ SENTINEL NODE BIOPSY Left 03/27/2021   Procedure: LEFT MASTECTOMY WITH LEFT AXILLARY SENTINEL LYMPH NODE BIOPSY;  Surgeon: Rolm Bookbinder, MD;  Location: [place];  Service: General;  Laterality: Left;   TONSILLECTOMY     TUBAL LIGATION     Not sure of date    There were no vitals filed for this visit.   Subjective Assessment - 05/03/21 0801     Subjective nothing new    Pertinent History Suspicious area found with screening mammogram.  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  She started neoadjuvant Anastrazole, but started having some sideeffects so is to discontinue for a week. Left mastectomy with SLNB performed on  03/27/2021. Drain removed on 04/05/2021    Currently in Pain?  No/denies                [facility] PT Assessment - 05/03/21 0001       AROM   Left Shoulder Flexion 146 Degrees    Left Shoulder ABduction 170 Degrees    Left Shoulder External Rotation 84 Degrees                           [facility] Adult PT Treatment/Exercise - 05/03/21 0001       Exercises   Exercises Shoulder      Shoulder Exercises: Supine   Horizontal ABduction Strengthening;Both;20 reps    Theraband Level (Shoulder Horizontal ABduction) Level 1 (Yellow)    External Rotation Both;20 reps    Theraband Level (Shoulder External Rotation) Level 1 (Yellow)    Flexion Both;10 reps    Theraband Level (Shoulder Flexion) Level 1 (Yellow)    Diagonals Both;10 reps    Theraband Level (Shoulder Diagonals) Level 1 (Yellow)      Shoulder Exercises: Pulleys   Flexion 2 minutes    ABduction 2 minutes      Shoulder Exercises: Therapy Ball   Flexion 5 reps    ABduction 5 reps      Manual Therapy   Passive ROM to L  UE in to flexion, abduction and ER                          PT Long Term Goals - 05/03/21 0827       PT LONG TERM GOAL #1   Title Pt will have bilateral shoulder ROM returned to WNL    Status Achieved      PT LONG TERM GOAL #2   Title Pt will be independent in a HEP to improve shoulder ROM /strength    Status Achieved      PT LONG TERM GOAL #3   Title Pt will attend ABC class and understand precautions for lymphedema    Status Achieved      PT LONG TERM GOAL #4   Title Pt will have decreased complaints of tightness by atleast 50%    Status Achieved      PT LONG TERM GOAL #5   Title Pt will be independent in MLD prn to decrease left chest swelling    Status Achieved                   Plan - 05/03/21 0848     Clinical Impression Statement Pt has met all goals and reports feeling ready for DC at this time.  Pt is ind with stretches and supine scap to continue strength and stretching and has returned to baseline  AROM.  Pt will continue SOZO screening for lymphedema.    PT Next Visit Plan SOZO screen    Consulted and Agree with Plan of Care Patient             Patient will benefit from skilled therapeutic intervention in order to improve the following deficits and impairments:     Visit Diagnosis: Localized edema  Abnormal posture  Malignant neoplasm of upper-outer quadrant of left breast in female, estrogen receptor positive [facility])     Problem List Patient Active Problem List   Diagnosis Date Noted   S/P mastectomy, left 03/27/2021   Genetic testing 03/12/2021   Family history of breast cancer 02/27/2021   Family history of melanoma 02/27/2021   Family history of glioblastoma 02/27/2021   Malignant neoplasm of upper-outer quadrant of left breast in female, estrogen receptor positive ([place]) 02/22/2021   Essential hypertension, benign 01/24/2011   Hyperlipidemia 01/24/2011   Unspecified vitamin D deficiency 01/24/2011    Stark Bray, PT 05/03/2021, 8:51 AM  [place], [place], [address] Phone: [phone]   Fax:  [phone]  Name: Terri Hawkins MRN: [id number] Date of Birth: 19-Jun-1951  PHYSICAL THERAPY DISCHARGE SUMMARY  Visits from Start of Care: 6  Current functional level related to goals / functional outcomes:all met   Remaining deficits: none reported   Education / Equipment: HEP Plan: Patient agrees to discharge.  Patient goals were not met. Patient is being discharged due to meeting the stated rehab goals.      Shan Levans, PT

## 2021-05-06 ENCOUNTER — Encounter: Payer: Medicare Other | Admitting: Rehabilitation

## 2021-05-08 ENCOUNTER — Encounter: Payer: Medicare Other | Admitting: Rehabilitation

## 2021-05-09 ENCOUNTER — Encounter: Payer: Self-pay | Admitting: Emergency Medicine

## 2021-05-09 ENCOUNTER — Ambulatory Visit
Admission: EM | Admit: 2021-05-09 | Discharge: 2021-05-09 | Disposition: A | Discharge status: Home / Self Care | Payer: Medicare Other | Attending: Internal Medicine | Admitting: Internal Medicine

## 2021-05-09 ENCOUNTER — Other Ambulatory Visit: Payer: Self-pay

## 2021-05-09 DIAGNOSIS — H1031 Unspecified acute conjunctivitis, right eye: Secondary | ICD-10-CM | POA: Diagnosis not present

## 2021-05-09 MED ORDER — ERYTHROMYCIN 5 MG/GM OP OINT: TOPICAL_OINTMENT | OPHTHALMIC | 0 refills | Status: DC

## 2021-05-09 NOTE — ED Provider Notes (Signed)
[name]    CSN: [id number] Arrival date & time: 05/09/21  1352      History   Chief Complaint Chief Complaint  Patient presents with   Conjunctivitis    HPI Terri Hawkins is a 70 y.o. female.   Patient presents with redness and swelling to the right eye that has been present for 1 day.  Denies any blurry vision or injury to the eye.  Patient reports her grandson had pinkeye recently and she was helping take care of him.  Denies any pain to the eye.  Does have some purulent drainage to the eye as well.  Denies any history of eye disorders including glaucoma and cataracts.   Conjunctivitis   Past Medical History:  Diagnosis Date   Allergy 2016, I think   Yellow jacket sting   Breast cancer ([place]) 02/2021   Eczema    Family history of breast cancer 02/27/2021   Family history of glioblastoma 02/27/2021   Family history of melanoma 02/27/2021   Hyperlipidemia    Hypertension    Obesity    Pre-diabetes    Skin cancer Not sure of date   [facility]   Vitamin D deficiency     Patient Active Problem List   Diagnosis Date Noted   S/P mastectomy, left 03/27/2021   Genetic testing 03/12/2021   Family history of breast cancer 02/27/2021   Family history of melanoma 02/27/2021   Family history of glioblastoma 02/27/2021   Malignant neoplasm of upper-outer quadrant of left breast in female, estrogen receptor positive ([place]) 02/22/2021   Essential hypertension, benign 01/24/2011   Hyperlipidemia 01/24/2011   Unspecified vitamin D deficiency 01/24/2011    Past Surgical History:  Procedure Laterality Date   CESAREAN SECTION     x3   CHOLECYSTECTOMY     MASTECTOMY W/ SENTINEL NODE BIOPSY Left 03/27/2021   Procedure: LEFT MASTECTOMY WITH LEFT AXILLARY SENTINEL LYMPH NODE BIOPSY;  Surgeon: Rolm Bookbinder, MD;  Location: [facility];  Service: General;  Laterality: Left;   TONSILLECTOMY     TUBAL LIGATION     Not sure of date    OB History    No obstetric history on file.      Home Medications    Prior to Admission medications   Medication Sig Start Date End Date Taking? Authorizing Provider  acetaminophen (TYLENOL) 500 MG tablet Take 500 mg by mouth every 6 (six) hours as needed for moderate pain or headache.   Yes [provider]  amLODipine (NORVASC) 5 MG tablet Take 1 tablet (5 mg total) by mouth daily. 02/22/21 05/23/21 Yes Minette Brine, Amy J, NP  anastrozole (ARIMIDEX) 1 MG tablet Take 1 tablet (1 mg total) by mouth daily. 02/27/21  Yes Truitt Merle, MD  Cholecalciferol 25 MCG (1000 UT) CHEW Chew 1,000 Units by mouth daily.   Yes [provider]  erythromycin ophthalmic ointment Place a 1/2 inch ribbon of ointment into the lower eyelid 4 times daily for 7 days. 05/09/21  Yes Odis Luster, FNP  melatonin 3 MG TABS tablet Take 6 mg by mouth at bedtime as needed (sleep).   Yes [provider]  Multiple Vitamin (MULTIVITAMIN) capsule Take 1 capsule by mouth daily.   Yes [provider]  clotrimazole (LOTRIMIN AF) 1 % cream Apply 1 application topically 2 (two) times daily. 03/13/21   [name], NP  methocarbamol (ROBAXIN) 500 MG tablet Take 1 tablet (500 mg total) by mouth every 6 (six) hours as  needed for muscle spasms. 03/28/21   Rolm Bookbinder, MD  oxyCODONE (OXY IR/ROXICODONE) 5 MG immediate release tablet Take 1 tablet (5 mg total) by mouth every 4 (four) hours as needed for moderate pain. 03/28/21   Rolm Bookbinder, MD  triamcinolone cream (KENALOG) 0.1 % Apply 1 application topically 2 (two) times daily. 03/13/21   [name], NP    Family History Family History  Problem Relation Age of Onset   Breast cancer Mother 43   Diabetes Father    Brain cancer Father    Cancer Father 67       glioblastoma   Breast cancer Maternal Aunt        dx after 77   Melanoma Maternal Uncle        dx after 50, arm    Social History Social History   Tobacco Use   Smoking status: Never    Smokeless tobacco: Never  Vaping Use   Vaping Use: Never used  Substance Use Topics   Alcohol use: Yes    Alcohol/week: 1.0 standard drink    Types: 1 Glasses of wine per week    Comment: Some weeks 0 wine   Drug use: No     Allergies   Zocor [simvastatin], Wasp venom protein, and Yellow jacket venom [bee venom]   Review of Systems Review of Systems Per HPI  Physical Exam Triage Vital Signs ED Triage Vitals  Enc Vitals Group     BP 05/09/21 1451 (!) 144/77     Pulse Rate 05/09/21 1451 85     Resp 05/09/21 1451 16     Temp 05/09/21 1451 98.4 F (36.9 C)     Temp Source 05/09/21 1451 Oral     SpO2 05/09/21 1451 96 %     Weight --      Height --      Head Circumference --      Peak Flow --      Pain Score 05/09/21 1528 0     Pain Loc --      Pain Edu? --      Excl. in [place]? --    No data found.  Updated Vital Signs BP (!) 144/77 (BP Location: Right Arm)   Pulse 85   Temp 98.4 F (36.9 C) (Oral)   Resp 16   SpO2 96%   Visual Acuity Right Eye Distance: 20/25 Left Eye Distance: 20/20 Bilateral Distance: 20/20  Right Eye Near:   Left Eye Near:    Bilateral Near:     Physical Exam Constitutional:      Appearance: Normal appearance.  HENT:     Head: Normocephalic and atraumatic.  Eyes:     General: Lids are everted, no foreign bodies appreciated.     Extraocular Movements: Extraocular movements intact.     Conjunctiva/sclera:     Right eye: Right conjunctiva is injected. Exudate present. No chemosis or hemorrhage.    Pupils: Pupils are equal, round, and reactive to light.     Comments: Erythema and swelling noted to upper right eyelid and right conjunctivae.  Purulent drainage noted from right eye as well.  Scleral redness present.  Pulmonary:     Effort: Pulmonary effort is normal.  Neurological:     General: No focal deficit present.     Mental Status: She is alert and oriented to person, place, and time. Mental status is at baseline.   Psychiatric:        Mood and Affect: Mood normal.  Behavior: Behavior normal.        Thought Content: Thought content normal.        Judgment: Judgment normal.     UC Treatments / Results  Labs (all labs ordered are listed, but only abnormal results are displayed) Labs Reviewed - No data to display  EKG   Radiology No results found.  Procedures Procedures (including critical care time)  Medications Ordered in UC Medications - No data to display  Initial Impression / Assessment and Plan / UC Course  I have reviewed the triage vital signs and the nursing notes.  Pertinent labs & imaging results that were available during my care of the patient were reviewed by me and considered in my medical decision making (see chart for details).     Physical exam is most consistent with right bacterial conjunctivitis.  Also had close exposure to bacterial conjunctivitis.  Will treat with erythromycin eye ointment.  Visual acuity normal on exam.  No red flags seen.Discussed strict return precautions. Patient verbalized understanding and is agreeable with plan.  Final Clinical Impressions(s) / UC Diagnoses   Final diagnoses:  Acute bacterial conjunctivitis of right eye     Discharge Instructions      You have pinkeye of the right eye.  You have been prescribed erythromycin eye ointment to treat this.  Please change pillowcase nightly to avoid infection into the left eye.     ED Prescriptions     Medication Sig Dispense Auth. Provider   erythromycin ophthalmic ointment Place a 1/2 inch ribbon of ointment into the lower eyelid 4 times daily for 7 days. 3.5 g Odis Luster, FNP      PDMP not reviewed this encounter.   Odis Luster, FNP 05/09/21 1539

## 2021-05-09 NOTE — Discharge Instructions (Addendum)
You have pinkeye of the right eye.  You have been prescribed erythromycin eye ointment to treat this.  Please change pillowcase nightly to avoid infection into the left eye.

## 2021-05-09 NOTE — ED Triage Notes (Signed)
Patient c/o possible pink eye in right eye x 1 day.  The eye does feel irritated.  Grandson did have pink eye in both eyes.

## 2021-05-14 ENCOUNTER — Encounter: Payer: Medicare Other | Admitting: Rehabilitation

## 2021-05-27 ENCOUNTER — Ambulatory Visit: Payer: Medicare Other | Admitting: Family

## 2021-05-27 ENCOUNTER — Ambulatory Visit (INDEPENDENT_AMBULATORY_CARE_PROVIDER_SITE_OTHER): Payer: Medicare Other | Admitting: Family Medicine

## 2021-05-27 ENCOUNTER — Other Ambulatory Visit: Payer: Self-pay

## 2021-05-27 ENCOUNTER — Encounter: Payer: Self-pay | Admitting: Family Medicine

## 2021-05-27 ENCOUNTER — Other Ambulatory Visit: Payer: Self-pay | Admitting: Hematology

## 2021-05-27 DIAGNOSIS — I10 Essential (primary) hypertension: Secondary | ICD-10-CM

## 2021-05-27 MED ORDER — AMLODIPINE BESYLATE 5 MG PO TABS: 5.0000 mg | ORAL_TABLET | Freq: Every day | ORAL | 1 refills | Status: DC

## 2021-05-27 NOTE — Progress Notes (Signed)
Established  Patient Office Visit  Subjective:  Patient ID: Terri Hawkins, female    DOB: [birth year]  Age: 70 y.o. MRN: [id number]  CC:  Chief Complaint  Patient presents with   Hypertension   Follow-up    HPI Terri Hawkins presents for follow up of hypertension. Patient denies any acute complaints or concerns.   Past Medical History:  Diagnosis Date   Allergy 2016, I think   Yellow jacket sting   Breast cancer ([place]) 02/2021   Eczema    Family history of breast cancer 02/27/2021   Family history of glioblastoma 02/27/2021   Family history of melanoma 02/27/2021   Hyperlipidemia    Hypertension    Obesity    Pre-diabetes    Skin cancer Not sure of date   [facility]   Vitamin D deficiency     Social History   Socioeconomic History   Marital status: Divorced    Spouse name: Not on file   Number of children: 2   Years of education: 14   Highest education level: Bachelor's degree (e.g., BA, AB, BS)  Occupational History   Occupation: retire    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  Tobacco Use   Smoking status: Never   Smokeless tobacco: Never  Vaping Use   Vaping Use: Never used  Substance and Sexual Activity   Alcohol use: Yes    Alcohol/week: 1.0 standard drink    Types: 1 Glasses of wine per week    Comment: Some weeks 0 wine   Drug use: No   Sexual activity: Not Currently    Birth control/protection: Diaphragm, I.U.D., Pill, Post-menopausal, Other-see comments    Comment: Tubal ligation  Other Topics Concern   Not on file  Social History Narrative   Not on file   Social Determinants of Health   Financial Resource Strain: Low Risk    Difficulty of Paying Living Expenses: Not hard at all  Food Insecurity: No Food Insecurity   Worried About Charity fundraiser in the Last Year: Never true   Ran Out of Food in the Last Year: Never true  Transportation Needs: No Transportation Needs   Lack of Transportation  (Medical): No   Lack of Transportation (Non-Medical): No  Physical Activity: Insufficiently Active   Days of Exercise per Week: 7 days   Minutes of Exercise per Session: 20 min  Stress: No Stress Concern Present   Feeling of Stress : Not at all  Social Connections: Moderately Isolated   Frequency of Communication with Friends and Family: More than three times a week   Frequency of Social Gatherings with Friends and Family: Once a week   Attends Religious Services: Never   Marine scientist or Organizations: No   Attends Music therapist: Never   Marital Status: Living with partner  Intimate Partner Violence: Not At Risk   Fear of Current or Ex-Partner: No   Emotionally Abused: No   Physically Abused: No   Sexually Abused: No    ROS Review of Systems  All other systems reviewed and are negative.  Objective:   Today's Vitals: BP 127/76   Pulse 76   Temp 98.1 F (36.7 C) (Rectal)   Resp 16   Wt 177 lb 1.3 oz (80.3 kg)   SpO2 96%   BMI 30.40 kg/m   Physical Exam Vitals and nursing note reviewed.  Constitutional:      General: She is not in  acute distress. Cardiovascular:     Rate and Rhythm: Normal rate and regular rhythm.  Pulmonary:     Effort: Pulmonary effort is normal.     Breath sounds: Normal breath sounds.  Abdominal:     Palpations: Abdomen is soft.     Tenderness: There is no abdominal tenderness.  Neurological:     General: No focal deficit present.     Mental Status: She is alert and oriented to person, place, and time.    Assessment & Plan:   1. Essential hypertension Readings at goal. Meds refilled. Monitoring labs ordered. continue - amLODipine (NORVASC) 5 MG tablet; Take 1 tablet (5 mg total) by mouth daily.  Dispense: 90 tablet; Refill: 1 - Basic Metabolic Panel - AST - ALT - Lipid Panel    Outpatient Encounter Medications as of 05/27/2021  Medication Sig   acetaminophen (TYLENOL) 500 MG tablet Take 500 mg by mouth  every 6 (six) hours as needed for moderate pain or headache.   [DISCONTINUED] amLODipine (NORVASC) 5 MG tablet Take 1 tablet (5 mg total) by mouth daily.   amLODipine (NORVASC) 5 MG tablet Take 1 tablet (5 mg total) by mouth daily.   Cholecalciferol 25 MCG (1000 UT) CHEW Chew 1,000 Units by mouth daily. (Patient not taking: Reported on 05/27/2021)   erythromycin ophthalmic ointment Place a 1/2 inch ribbon of ointment into the lower eyelid 4 times daily for 7 days. (Patient not taking: Reported on 05/27/2021)   loteprednol (LOTEMAX) 0.5 % ophthalmic suspension 1 drop 4 (four) times daily. (Patient not taking: Reported on 05/27/2021)   melatonin 3 MG TABS tablet Take 6 mg by mouth at bedtime as needed (sleep). (Patient not taking: Reported on 05/27/2021)   Multiple Vitamin (MULTIVITAMIN) capsule Take 1 capsule by mouth daily. (Patient not taking: Reported on 05/27/2021)   [DISCONTINUED] anastrozole (ARIMIDEX) 1 MG tablet Take 1 tablet (1 mg total) by mouth daily. (Patient not taking: Reported on 05/27/2021)   [DISCONTINUED] clotrimazole (LOTRIMIN AF) 1 % cream Apply 1 application topically 2 (two) times daily.   [DISCONTINUED] methocarbamol (ROBAXIN) 500 MG tablet Take 1 tablet (500 mg total) by mouth every 6 (six) hours as needed for muscle spasms.   [DISCONTINUED] oxyCODONE (OXY IR/ROXICODONE) 5 MG immediate release tablet Take 1 tablet (5 mg total) by mouth every 4 (four) hours as needed for moderate pain.   [DISCONTINUED] triamcinolone cream (KENALOG) 0.1 % Apply 1 application topically 2 (two) times daily.   No facility-administered encounter medications on file as of 05/27/2021.    Follow-up: Return in about 6 months (around 11/25/2021) for follow up, chronic med issues.   Becky Sax, MD

## 2021-05-27 NOTE — Progress Notes (Signed)
Patient is here for follow-up 3 month HTN

## 2021-05-28 LAB — BASIC METABOLIC PANEL
BUN/Creatinine Ratio: 19 (ref 12–28)
BUN: 15 mg/dL (ref 8–27)
CO2: 23 mmol/L (ref 20–29)
Calcium: 9.9 mg/dL (ref 8.7–10.3)
Chloride: 102 mmol/L (ref 96–106)
Creatinine, Ser: 0.81 mg/dL (ref 0.57–1.00)
Glucose: 93 mg/dL (ref 70–99)
Potassium: 4.6 mmol/L (ref 3.5–5.2)
Sodium: 141 mmol/L (ref 134–144)
eGFR: 78 mL/min/{1.73_m2} (ref >59)

## 2021-05-28 LAB — LIPID PANEL
Chol/HDL Ratio: 3.6 ratio (ref 0.0–4.4)
Cholesterol, Total: 242 mg/dL — ABNORMAL HIGH (ref 100–199)
HDL: 67 mg/dL (ref >39)
LDL Chol Calc (NIH): 156 mg/dL — ABNORMAL HIGH (ref 0–99)
Triglycerides: 109 mg/dL (ref 0–149)
VLDL Cholesterol Cal: 19 mg/dL (ref 5–40)

## 2021-05-28 LAB — ALT: ALT: 27 IU/L (ref 0–32)

## 2021-05-28 LAB — AST: AST: 19 IU/L (ref 0–40)

## 2021-06-24 ENCOUNTER — Other Ambulatory Visit: Payer: Self-pay

## 2021-06-24 ENCOUNTER — Ambulatory Visit: Payer: Medicare Other | Attending: General Surgery

## 2021-06-24 VITALS — Wt 183.2 lb

## 2021-06-24 DIAGNOSIS — Z483 Aftercare following surgery for neoplasm: Secondary | ICD-10-CM | POA: Insufficient documentation

## 2021-06-24 NOTE — Therapy (Signed)
[facility] [place], [place], [address] Phone: [phone]   Fax:  [phone]  Physical Therapy Treatment  Patient Details  Name: Terri Hawkins MRN: [id number] Date of Birth: [birth year] Referring Provider (PT): Dr. Donne Hazel   Encounter Date: 06/24/2021   PT End of Session - 06/24/21 0952     Visit Number 6   # unchanged due to screen only   PT Start Time 0949    PT Stop Time 0955    PT Time Calculation (min) 6 min    Activity Tolerance Patient tolerated treatment well    Behavior During Therapy [facility] for tasks assessed/performed             Past Medical History:  Diagnosis Date   Allergy 2016, I think   Yellow jacket sting   Breast cancer ([place]) 02/2021   Eczema    Family history of breast cancer 02/27/2021   Family history of glioblastoma 02/27/2021   Family history of melanoma 02/27/2021   Hyperlipidemia    Hypertension    Obesity    Pre-diabetes    Skin cancer Not sure of date   [facility]   Vitamin D deficiency     Past Surgical History:  Procedure Laterality Date   CESAREAN SECTION     x3   CHOLECYSTECTOMY     MASTECTOMY W/ SENTINEL NODE BIOPSY Left 03/27/2021   Procedure: LEFT MASTECTOMY WITH LEFT AXILLARY SENTINEL LYMPH NODE BIOPSY;  Surgeon: Rolm Bookbinder, MD;  Location: [facility];  Service: General;  Laterality: Left;   TONSILLECTOMY     TUBAL LIGATION     Not sure of date    Vitals:   06/24/21 0951  Weight: 183 lb 4 oz (83.1 kg)     Subjective Assessment - 06/24/21 0950     Subjective Pt returns for her 3 month L-dex screen. "I fell stepping out of my shed not paying attention about 2 weeks ago. I landed right on my Lt side and my shoulder was really sore for awhile but I'm starting to be able to move my arm without pain again."    Pertinent History Suspicious area found with screening mammogram.  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  She started neoadjuvant Anastrazole, but started having some sideeffects so is to discontinue for a week. Left mastectomy with SLNB performed on  03/27/2021. Drain removed on 04/05/2021                    L-DEX FLOWSHEETS - 06/24/21 0900       L-DEX LYMPHEDEMA SCREENING   Measurement Type Unilateral    L-DEX MEASUREMENT EXTREMITY Upper Extremity    POSITION  Standing    DOMINANT SIDE Left    At Risk Side Left    BASELINE SCORE (UNILATERAL) -6.1                                     PT Long Term Goals - 05/03/21 0827       PT LONG TERM GOAL #1   Title Pt will have bilateral shoulder ROM returned to WNL    Status Achieved      PT LONG TERM GOAL #2   Title Pt will be independent in a HEP to improve shoulder ROM /strength    Status  Achieved      PT LONG TERM GOAL #3   Title Pt will attend ABC class and understand precautions for lymphedema    Status Achieved      PT LONG TERM GOAL #4   Title Pt will have decreased complaints of tightness by atleast 50%    Status Achieved      PT LONG TERM GOAL #5   Title Pt will be independent in MLD prn to decrease left chest swelling    Status Achieved                   Plan - 06/24/21 0954     Clinical Impression Statement Pt returns for her 3 month L-Dex screen. Her change from baseline of -1.4 is WNLs so no further treatment is required at this time except to cont every 3 month L-Dex screens which pt is agreeable to.    PT Next Visit Plan Cont every 3 month L-Dex screens for up to 2 years from her SLNB (~03/28/2023)    Consulted and Agree with Plan of Care Patient             Patient will benefit from skilled therapeutic intervention in order to improve the following deficits and impairments:     Visit Diagnosis: Aftercare following surgery for neoplasm     Problem List Patient Active Problem List   Diagnosis Date Noted   S/P  mastectomy, left 03/27/2021   Genetic testing 03/12/2021   Family history of breast cancer 02/27/2021   Family history of melanoma 02/27/2021   Family history of glioblastoma 02/27/2021   Malignant neoplasm of upper-outer quadrant of left breast in female, estrogen receptor positive ([place]) 02/22/2021   Essential hypertension, benign 01/24/2011   Hyperlipidemia 01/24/2011   Unspecified vitamin D deficiency 01/24/2011    Otelia Limes, PTA 06/24/2021, 9:58 AM  [facility] @ [place], [place], [address] Phone: [phone]   Fax:  [phone]  Name: Terri Hawkins MRN: [id number] Date of Birth: [birth year]

## 2021-07-23 ENCOUNTER — Telehealth: Payer: Self-pay | Admitting: *Deleted

## 2021-07-31 ENCOUNTER — Encounter: Payer: Self-pay | Admitting: Nurse Practitioner

## 2021-07-31 ENCOUNTER — Other Ambulatory Visit: Payer: Self-pay

## 2021-07-31 ENCOUNTER — Inpatient Hospital Stay: Payer: Medicare Other | Attending: Hematology | Admitting: Nurse Practitioner

## 2021-07-31 VITALS — BP 131/60 | HR 72 | Temp 98.2°F | Resp 16 | Ht 64.0 in | Wt 181.7 lb

## 2021-07-31 DIAGNOSIS — Z170 Estrogen receptor positive status [ER+]: Secondary | ICD-10-CM | POA: Diagnosis not present

## 2021-07-31 DIAGNOSIS — Z79811 Long term (current) use of aromatase inhibitors: Secondary | ICD-10-CM | POA: Diagnosis not present

## 2021-07-31 DIAGNOSIS — C50412 Malignant neoplasm of upper-outer quadrant of left female breast: Secondary | ICD-10-CM | POA: Diagnosis present

## 2021-07-31 DIAGNOSIS — Z9012 Acquired absence of left breast and nipple: Secondary | ICD-10-CM | POA: Insufficient documentation

## 2021-07-31 NOTE — Progress Notes (Signed)
CLINIC:  Survivorship   Patient Care Team: [name], NP as PCP - General (Nurse Practitioner) Mauro Kaufmann, RN as Oncology Nurse Navigator Rockwell Germany, RN as Oncology Nurse Navigator Truitt Merle, MD as Consulting Physician (Hematology) Rolm Bookbinder, MD as Consulting Physician (General Surgery) Gery Pray, MD as Consulting Physician (Radiation Oncology) Alla Feeling, NP as Nurse Practitioner (Nurse Practitioner)   REASON FOR VISIT:  Routine follow-up post-treatment for a recent history of breast cancer.  BRIEF ONCOLOGIC HISTORY:  Oncology History Overview Note  Cancer Staging Malignant neoplasm of upper-outer quadrant of left breast in female, estrogen receptor positive ([place]) Staging form: Breast, AJCC 8th Edition - Clinical stage from 02/19/2021: Stage IB (cT2, cN0, cM0, G2, ER+, PR+, HER2-) - Signed by Truitt Merle, MD on 02/26/2021 Stage prefix: Initial diagnosis Histologic grading system: 3 grade system    Malignant neoplasm of upper-outer quadrant of left breast in female, estrogen receptor positive ([place])  01/16/2021 Mammogram   DIGITAL SCREENING BILATERAL MAMMOGRAM WITH TOMOSYNTHESIS AND CAD  IMPRESSION: Further evaluation is suggested for a possible mass in the left breast.   02/08/2021 Mammogram   DIGITAL DIAGNOSTIC UNILATERAL LEFT MAMMOGRAM WITH TOMOSYNTHESIS AND CAD; ULTRASOUND LEFT BREAST LIMITED  IMPRESSION: 1. Highly suspicious left breast mass at the 12 o'clock position corresponding with the screening mammographic findings. It measures 2.6 x 2.1 x 1.5 cm. Recommendation is for ultrasound-guided biopsy. 2. Two additional indeterminate left breast masses at the 2 o'clock position 6 cm and 7 cm from the nipple. One of these likely corresponds with an additional mammographic mass. Recommendation is for ultrasound-guided biopsy. 3. No suspicious left axillary lymphadenopathy.     02/19/2021 Pathology Results   Diagnosis 1. Breast, left,  needle core biopsy, 2 o'clock 6 cmfn (ribbon) - INTRADUCTAL PAPILLOMA WITHOUT ATYPIA 2. Breast, left, needle core biopsy, 2 o'clock 7 cmfn (coil) - DUCTAL CARCINOMA IN-SITU ARISING IN INTRADUCTAL PAPILLOMA - SEE COMMENT 3. Breast, left, needle core biopsy, 12 o'clock (heart) - INVASIVE DUCTAL CARCINOMA - SEE COMMENT Microscopic Comment 3. Based on the biopsy, the carcinoma appears Nottingham grade 2 of 3 and measures 0.7 cm in greatest linear extent.  3. PROGNOSTIC INDICATORS Results: IMMUNOHISTOCHEMICAL AND MORPHOMETRIC ANALYSIS PERFORMED MANUALLY The tumor cells are EQUIVOCAL for Her2 (2+). Her2 by FISH will be performed and results reported separately. Estrogen Receptor: 95%, POSITIVE, STRONG STAINING INTENSITY Progesterone Receptor: 95%, POSITIVE, STRONG STAINING INTENSITY Proliferation Marker Ki67: 5%  3. FLUORESCENCE IN-SITU HYBRIDIZATION Results: GROUP 5: HER2 **NEGATIVE** Equivocal form of amplification of the HER2 gene was detected in the IHC 2+ tissue sample received from this individual. HER2 FISH was performed by a technologist and cell imaging and analysis on the [employer]. RATIO OF HER2/CEN17 SIGNALS 1.43 AVERAGE HER2 COPY NUMBER PER CELL 2.00   02/19/2021 Cancer Staging   Staging form: Breast, AJCC 8th Edition - Clinical stage from 02/19/2021: Stage IB (cT2, cN0, cM0, G2, ER+, PR+, HER2-) - Signed by Truitt Merle, MD on 02/26/2021 Stage prefix: Initial diagnosis Histologic grading system: 3 grade system    02/22/2021 Initial Diagnosis   Malignant neoplasm of upper-outer quadrant of left breast in female, estrogen receptor positive ([place])   03/05/2021 Genetic Testing   No pathogenic variants detected in [facility] BRCAPlus Panel and [facility] CancerNext-Expanded +RNAinsight Panel.  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  Of note, at the time of testing, [facility] classified this variant as uncertain; however, other laboratories classify this as likely  pathogenic.  The report dates  are March 05, 2021 and March 18, 2021.   The BRCAplus panel offered by [employer] and includes sequencing and deletion/duplication analysis for the following 8 genes: ATM, BRCA1, BRCA2, CDH1, CHEK2, PALB2, PTEN, and TP53.  The CancerNext-Expanded gene panel offered by [facility] and includes sequencing, rearrangement, and RNA analysis for the following 77 genes: AIP, ALK, APC, ATM, AXIN2, BAP1, BARD1, BLM, BMPR1A, BRCA1, BRCA2, BRIP1, CDC73, CDH1, CDK4, CDKN1B, CDKN2A, CHEK2, CTNNA1, DICER1, FANCC, FH, FLCN, GALNT12, KIF1B, LZTR1, MAX, MEN1, MET, MLH1, MSH2, MSH3, MSH6, MUTYH, NBN, NF1, NF2, NTHL1, PALB2, PHOX2B, PMS2, POT1, PRKAR1A, PTCH1, PTEN, RAD51C, RAD51D, RB1, RECQL, RET, SDHA, SDHAF2, SDHB, SDHC, SDHD, SMAD4, SMARCA4, SMARCB1, SMARCE1, STK11, SUFU, TMEM127, TP53, TSC1, TSC2, VHL and XRCC2 (sequencing and deletion/duplication); EGFR, EGLN1, HOXB13, KIT, MITF, PDGFRA, POLD1, and POLE (sequencing only); EPCAM and GREM1 (deletion/duplication only).    03/27/2021 Cancer Staging   Staging form: Breast, AJCC 8th Edition - Pathologic stage from 03/27/2021: Stage IA (pT2, pN0, cM0, G2, ER+, PR+, HER2-) - Signed by Truitt Merle, MD on 05/01/2021 Stage prefix: Initial diagnosis Histologic grading system: 3 grade system Residual tumor (R): R0 - None    07/31/2021 Survivorship   SCP delivered by Cira Rue, NP     INTERVAL HISTORY:  Ms. Tripathi presents to the [facility] today for our initial meeting to review her survivorship care plan detailing her treatment course for breast cancer, as well as monitoring long-term side effects of that treatment, education regarding health maintenance, screening, and overall wellness and health promotion.     Overall, Ms. Petras is doing fairly well in general.  She continued anastrozole, developed increasing mild-moderate hot flashes, insomnia, dry skin and itching, and "muscle and ligament" aches in her legs making it  difficult to stand.  She has started walking again, trying to lose weight and be active.  She admits she stopped anastrozole 2 weeks ago due to side effects, some of which have improved since she stopped.  She does not want to continue at the expense of her quality of life and feeling like she is "70 years old."  Denies concerns in her breasts or left chest wall such as new lump/mass, nipple discharge or inversion, or skin change.  Bowels moving well, denies abdominal pain or bloating, back pain, cough, chest pain, dyspnea, or other new complaints.   ONCOLOGY TREATMENT TEAM:  1. Surgeon:  Dr. Donne Hazel at [facility] 2. Medical Oncologist: Dr. Burr Medico 3. Radiation Oncologist: Dr. Sondra Come, did not require postmastectomy radiation    PAST MEDICAL/SURGICAL HISTORY:  Past Medical History:  Diagnosis Date   Allergy 2016, I think   Yellow jacket sting   Breast cancer ([place]) 02/2021   Eczema    Family history of breast cancer 02/27/2021   Family history of glioblastoma 02/27/2021   Family history of melanoma 02/27/2021   Hyperlipidemia    Hypertension    Obesity    Pre-diabetes    Skin cancer Not sure of date   [facility]   Vitamin D deficiency    Past Surgical History:  Procedure Laterality Date   CESAREAN SECTION     x3   CHOLECYSTECTOMY     MASTECTOMY W/ SENTINEL NODE BIOPSY Left 03/27/2021   Procedure: LEFT MASTECTOMY WITH LEFT AXILLARY SENTINEL LYMPH NODE BIOPSY;  Surgeon: Rolm Bookbinder, MD;  Location: [facility];  Service: General;  Laterality: Left;   TONSILLECTOMY     TUBAL LIGATION     Not sure of date  ALLERGIES:  Allergies  Allergen Reactions   Zocor [Simvastatin]     Leg pain   Wasp Venom Protein Itching    Blood pressure goes up   Yellow Jacket Venom [Bee Venom] Itching    Blood pressure up     CURRENT MEDICATIONS:  Outpatient Encounter Medications as of 07/31/2021  Medication Sig   acetaminophen (TYLENOL) 500 MG tablet Take 500 mg  by mouth every 6 (six) hours as needed for moderate pain or headache.   amLODipine (NORVASC) 5 MG tablet Take 1 tablet (5 mg total) by mouth daily.   Cholecalciferol 25 MCG (1000 UT) CHEW Chew 1,000 Units by mouth daily.   melatonin 3 MG TABS tablet Take 6 mg by mouth at bedtime as needed (sleep).   Multiple Vitamin (MULTIVITAMIN) capsule Take 1 capsule by mouth daily.   erythromycin ophthalmic ointment Place a 1/2 inch ribbon of ointment into the lower eyelid 4 times daily for 7 days. (Patient not taking: Reported on 05/27/2021)   loteprednol (LOTEMAX) 0.5 % ophthalmic suspension 1 drop 4 (four) times daily. (Patient not taking: Reported on 05/27/2021)   [DISCONTINUED] anastrozole (ARIMIDEX) 1 MG tablet TAKE 1 TABLET BY MOUTH EVERY DAY   No facility-administered encounter medications on file as of 07/31/2021.     ONCOLOGIC FAMILY HISTORY:  Family History  Problem Relation Age of Onset   Breast cancer Mother 41   Diabetes Father    Brain cancer Father    Cancer Father 68       glioblastoma   Breast cancer Maternal Aunt        dx after 39   Melanoma Maternal Uncle        dx after 50, arm     GENETIC COUNSELING/TESTING: Yes, VUS in CHEK2  SOCIAL HISTORY:  TIFFANNIE SLOSS denies any current or history of tobacco, alcohol, or illicit drug use.     PHYSICAL EXAMINATION:  Vital Signs:   Vitals:   07/31/21 1230  BP: 131/60  Pulse: 72  Resp: 16  Temp: 98.2 F (36.8 C)  SpO2: 100%   Filed Weights   07/31/21 1230  Weight: 181 lb 11.2 oz (82.4 kg)   General: Well-nourished, well-appearing female in no acute distress.   HEENT: Sclerae anicteric.  Lymph: No cervical, supraclavicular, or infraclavicular lymphadenopathy noted on palpation.  Respiratory:  breathing non-labored.  Neuro: No focal deficits. Steady gait.  Psych: Mood and affect normal and appropriate for situation.  Extremities: No edema. MSK: No focal spinal tenderness to palpation.  Full range of motion in  bilateral upper extremities Skin: Dry with faint maculopapular rash over the right abdomen  LABORATORY DATA:  None for this visit.  DIAGNOSTIC IMAGING:  None for this visit.      ASSESSMENT AND PLAN:  Ms.. Bowlds is a pleasant 70 y.o. 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  She presents to the Survivorship Clinic for our initial meeting and routine follow-up post-completion of treatment for breast cancer.    1. Stage 1B left breast cancer:  Ms. Rackham has recovered well from definitive treatment for breast cancer. She will follow-up with her medical oncologist, Dr. Burr Medico in 10/2021 with history and physical exam per surveillance protocol.  She did not tolerate anastrozole well, with hot flashes, insomnia, dry skin and itching, and arthralgia; she stopped taking it in December 2022.  Some symptoms have improved since then.  We discussed alternatives such as exemestane versus tamoxifen and reviewed potential side effect/benefit of each.  She declined further antiestrogen therapy and prefers to reduce estrogen through her diet, live a healthy active lifestyle, and proceed with surveillance alone.   Today, a comprehensive survivorship care plan and treatment summary was reviewed with the patient today detailing her breast cancer diagnosis, treatment course, potential late/long-term effects of treatment, appropriate follow-up care with recommendations for the future, and patient education resources.  A copy of this summary, along with a letter will be sent to the patients primary care provider via In Basket message after todays visit.    2. Bone health:  Given Ms. Aloia's age/history of breast cancer and her treatment regimen including anti-estrogen therapy with anastrozole, she is at risk for bone demineralization.  Her last DEXA  scan was normal 03/29/2009, she is scheduled for repeat DEXA 08/2021.  She has stopped antiestrogen therapy.  I encouraged her to take calcium/vitamin D and increase her weight-bearing activities.  She was given education on specific activities to promote bone health.  3. Cancer screening:  Due to Ms. Reever's history and her age, she should receive screening for skin cancers, colon cancer.  She has likely aged out of Pap smears.  Last colonoscopy age 36, she is overdue but not interested.  I reviewed other options such as Cologuard, she can discuss with PCP.  The information and recommendations are listed on the patient's comprehensive care plan/treatment summary and were reviewed in detail with the patient.    4. Health maintenance and wellness promotion: Ms. Delage was encouraged to consume 5-7 servings of fruits and vegetables per day. We reviewed the "Nutrition Rainbow" handout. She was also encouraged to engage in moderate to vigorous exercise for 30 minutes per day most days of the week. She was instructed to limit her alcohol consumption and continue to abstain from tobacco use.   5. Support services/counseling: It is not uncommon for this period of the patient's cancer care trajectory to be one of many emotions and stressors.  We discussed an opportunity for her to participate in the next session of [facility] ("Finding Your New Normal") support group series designed for patients after they have completed treatment.   Ms. Vanderford was encouraged to take advantage of our many other support services programs, support groups, and/or counseling in coping with her new life as a cancer survivor after completing anti-cancer treatment.  She was offered support today through active listening and expressive supportive counseling.  She was given information regarding our available services and encouraged to contact me with any questions or for help enrolling in any of our support group/programs.    Dispo:    -Patient elected to discontinue antiestrogen, proceed with surveillance alone  -return to cancer center 10/30/2021 as scheduled -Screening right breast mammogram due in 01/2022 -Follow up with surgery as needed in the future -She is welcome to return back to the Survivorship Clinic at any time; no additional follow-up needed at this time.  -Consider referral back to survivorship as a long-term survivor for continued surveillance  A total of (30) minutes of face-to-face time was spent with this patient with greater than 50% of that time in counseling and care-coordination.   Cira Rue, NP Survivorship Program [facility] [phone]   Note: PRIMARY CARE PROVIDER [name], [place] [phone]

## 2021-08-16 ENCOUNTER — Ambulatory Visit
Admission: RE | Admit: 2021-08-16 | Discharge: 2021-08-16 | Disposition: A | Discharge status: Home / Self Care | Payer: Medicare Other | Source: Ambulatory Visit | Attending: Hematology | Admitting: Hematology

## 2021-08-16 DIAGNOSIS — E2839 Other primary ovarian failure: Secondary | ICD-10-CM

## 2021-08-22 ENCOUNTER — Other Ambulatory Visit: Payer: Self-pay | Admitting: Nurse Practitioner

## 2021-08-22 DIAGNOSIS — Z853 Personal history of malignant neoplasm of breast: Secondary | ICD-10-CM

## 2021-09-23 ENCOUNTER — Other Ambulatory Visit: Payer: Self-pay

## 2021-09-23 ENCOUNTER — Ambulatory Visit: Payer: Medicare Other | Attending: General Surgery

## 2021-09-23 DIAGNOSIS — Z483 Aftercare following surgery for neoplasm: Secondary | ICD-10-CM | POA: Insufficient documentation

## 2021-09-23 NOTE — Therapy (Signed)
[facility] @ [address] [place], [place], [address] Phone: [phone]   Fax:  [phone]  Physical Therapy Treatment  Patient Details  Name: SARHA BARTELT MRN: [id number] Date of Birth: 08-06-1951 Referring Provider (PT): Dr. Donne Hazel   Encounter Date: 09/23/2021   PT End of Session - 09/23/21 0958     Visit Number 6   screen only            Past Medical History:  Diagnosis Date   Allergy 2016, I think   Yellow jacket sting   Breast cancer ([place]) 02/2021   Eczema    Family history of breast cancer 02/27/2021   Family history of glioblastoma 02/27/2021   Family history of melanoma 02/27/2021   Hyperlipidemia    Hypertension    Obesity    Pre-diabetes    Skin cancer Not sure of date   [facility]   Vitamin D deficiency     Past Surgical History:  Procedure Laterality Date   CESAREAN SECTION     x3   CHOLECYSTECTOMY     MASTECTOMY W/ SENTINEL NODE BIOPSY Left 03/27/2021   Procedure: LEFT MASTECTOMY WITH LEFT AXILLARY SENTINEL LYMPH NODE BIOPSY;  Surgeon: Rolm Bookbinder, MD;  Location: [facility];  Service: General;  Laterality: Left;   TONSILLECTOMY     TUBAL LIGATION     Not sure of date    There were no vitals filed for this visit.   Subjective Assessment - 09/23/21 0956     Subjective returns for 6 months L-dex screen    Pertinent History Suspicious area found with screening mammogram.  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  She started neoadjuvant Anastrazole, but started having some sideeffects so is to discontinue for a week. Left mastectomy with SLNB performed on  03/27/2021. Drain removed on 04/05/2021    Currently in Pain? No/denies                    L-DEX FLOWSHEETS - 09/23/21 0900       L-DEX LYMPHEDEMA SCREENING   Measurement Type Unilateral    L-DEX MEASUREMENT EXTREMITY Upper Extremity     POSITION  Standing    DOMINANT SIDE Left    At Risk Side Left    BASELINE SCORE (UNILATERAL) -6.1    L-DEX SCORE (UNILATERAL) -3.1    VALUE CHANGE (UNILAT) 3                                     PT Long Term Goals - 05/03/21 0827       PT LONG TERM GOAL #1   Title Pt will have bilateral shoulder ROM returned to WNL    Status Achieved      PT LONG TERM GOAL #2   Title Pt will be independent in a HEP to improve shoulder ROM /strength    Status Achieved      PT LONG TERM GOAL #3   Title Pt will attend ABC class and understand precautions for lymphedema    Status Achieved      PT LONG TERM GOAL #4   Title Pt will have decreased complaints of tightness by atleast 50%    Status Achieved      PT LONG TERM GOAL #5   Title Pt will be independent in MLD  prn to decrease left chest swelling    Status Achieved                   Plan - 09/23/21 0958     Clinical Impression Statement Pt returns for 6 month L-Dex screen.  Her change from baseline is 3.0 which is WNL.  Pt will return at 9 months    PT Next Visit Plan Cont every 3 month L-Dex screens for up to 2 years from her SLNB (~03/28/2023)             Patient will benefit from skilled therapeutic intervention in order to improve the following deficits and impairments:     Visit Diagnosis: Aftercare following surgery for neoplasm     Problem List Patient Active Problem List   Diagnosis Date Noted   S/P mastectomy, left 03/27/2021   Genetic testing 03/12/2021   Family history of breast cancer 02/27/2021   Family history of melanoma 02/27/2021   Family history of glioblastoma 02/27/2021   Malignant neoplasm of upper-outer quadrant of left breast in female, estrogen receptor positive ([place]) 02/22/2021   Essential hypertension, benign 01/24/2011   Hyperlipidemia 01/24/2011   Unspecified vitamin D deficiency 01/24/2011    Otelia Limes, PTA 09/23/2021, 9:59 AM  [facility] @ [place], [place], [address] Phone: [phone]   Fax:  [phone]  Name: DESHONDA CRYDERMAN MRN: [id number] Date of Birth: [birth year]

## 2021-09-30 ENCOUNTER — Ambulatory Visit
Admission: RE | Admit: 2021-09-30 | Discharge: 2021-09-30 | Disposition: A | Discharge status: Home / Self Care | Payer: Medicare Other | Source: Ambulatory Visit | Attending: Nurse Practitioner | Admitting: Nurse Practitioner

## 2021-09-30 DIAGNOSIS — Z853 Personal history of malignant neoplasm of breast: Secondary | ICD-10-CM

## 2021-10-29 ENCOUNTER — Other Ambulatory Visit: Payer: Self-pay

## 2021-10-29 DIAGNOSIS — C50412 Malignant neoplasm of upper-outer quadrant of left female breast: Secondary | ICD-10-CM

## 2021-10-30 ENCOUNTER — Encounter: Payer: Self-pay | Admitting: Hematology

## 2021-10-30 ENCOUNTER — Inpatient Hospital Stay: Payer: Medicare Other

## 2021-10-30 ENCOUNTER — Other Ambulatory Visit: Payer: Self-pay

## 2021-10-30 ENCOUNTER — Inpatient Hospital Stay: Payer: Medicare Other | Attending: Hematology | Admitting: Hematology

## 2021-10-30 VITALS — BP 140/67 | HR 69 | Temp 97.7°F | Resp 18 | Ht 64.0 in | Wt 177.9 lb

## 2021-10-30 DIAGNOSIS — Z170 Estrogen receptor positive status [ER+]: Secondary | ICD-10-CM

## 2021-10-30 DIAGNOSIS — Z9012 Acquired absence of left breast and nipple: Secondary | ICD-10-CM | POA: Diagnosis not present

## 2021-10-30 DIAGNOSIS — Z79899 Other long term (current) drug therapy: Secondary | ICD-10-CM | POA: Insufficient documentation

## 2021-10-30 DIAGNOSIS — C50412 Malignant neoplasm of upper-outer quadrant of left female breast: Secondary | ICD-10-CM | POA: Insufficient documentation

## 2021-10-30 DIAGNOSIS — M8580 Other specified disorders of bone density and structure, unspecified site: Secondary | ICD-10-CM | POA: Diagnosis not present

## 2021-10-30 LAB — CBC WITH DIFFERENTIAL (CANCER CENTER ONLY)
Abs Immature Granulocytes: 0.01 10*3/uL (ref 0.00–0.07)
Basophils Absolute: 0 10*3/uL (ref 0.0–0.1)
Basophils Relative: 1 %
Eosinophils Absolute: 0.1 10*3/uL (ref 0.0–0.5)
Eosinophils Relative: 2 %
HCT: 42.3 % (ref 36.0–46.0)
Hemoglobin: 13.7 g/dL (ref 12.0–15.0)
Immature Granulocytes: 0 %
Lymphocytes Relative: 46 %
Lymphs Abs: 3.3 10*3/uL (ref 0.7–4.0)
MCH: 28.8 pg (ref 26.0–34.0)
MCHC: 32.4 g/dL (ref 30.0–36.0)
MCV: 89.1 fL (ref 80.0–100.0)
Monocytes Absolute: 0.6 10*3/uL (ref 0.1–1.0)
Monocytes Relative: 9 %
Neutro Abs: 3 10*3/uL (ref 1.7–7.7)
Neutrophils Relative %: 42 %
Platelet Count: 254 10*3/uL (ref 150–400)
RBC: 4.75 MIL/uL (ref 3.87–5.11)
RDW: 13.6 % (ref 11.5–15.5)
WBC Count: 7.2 10*3/uL (ref 4.0–10.5)
nRBC: 0 % (ref 0.0–0.2)

## 2021-10-30 LAB — CMP (CANCER CENTER ONLY)
ALT: 15 U/L (ref 0–44)
AST: 17 U/L (ref 15–41)
Albumin: 4.6 g/dL (ref 3.5–5.0)
Alkaline Phosphatase: 89 U/L (ref 38–126)
Anion gap: 8 (ref 5–15)
BUN: 19 mg/dL (ref 8–23)
CO2: 28 mmol/L (ref 22–32)
Calcium: 10.2 mg/dL (ref 8.9–10.3)
Chloride: 103 mmol/L (ref 98–111)
Creatinine: 0.82 mg/dL (ref 0.44–1.00)
GFR, Estimated: >60 mL/min (ref >60)
Glucose, Bld: 88 mg/dL (ref 70–99)
Potassium: 4.2 mmol/L (ref 3.5–5.1)
Sodium: 139 mmol/L (ref 135–145)
Total Bilirubin: 0.7 mg/dL (ref 0.3–1.2)
Total Protein: 7.5 g/dL (ref 6.5–8.1)

## 2021-10-30 NOTE — Progress Notes (Signed)
?[place]   ?[phone] [phone]   ?Clinic Follow up Note  ? ?Patient Care Team: ?Terri Herter, NP as PCP - General (Nurse Practitioner) ?Mauro Kaufmann, RN as Oncology Nurse Navigator ?Rockwell Germany, RN as Oncology Nurse Navigator ?Truitt Merle, MD as Consulting Physician (Hematology) ?Rolm Bookbinder, MD as Consulting Physician (General Surgery) ?Gery Pray, MD as Consulting Physician (Radiation Oncology) ?Alla Feeling, NP as Nurse Practitioner (Nurse Practitioner) ? ?Date of Service:  10/30/2021 ? ?CHIEF COMPLAINT: f/u of left breast cancer ? ?CURRENT THERAPY:  ?Surveillance ? ?ASSESSMENT & PLAN:  ?Terri Hawkins is a 71 y.o. post-menopausal female with  ? ?1. Malignant neoplasm of upper-outer quadrant of left breast, Invasive Ductal Carcinoma, Stage IB , pT2N0 cM0, ER+/PR+/HER2-, Grade 2  ?-left breast mass found on screening mammogram 01/2021. Biopsy on 02/19/21 showed grade 2 IDC at 12 o'clock and DCIS at 2 o'clock, ER+, PR+, Her2- by FISH. ?-she started anastrozole preoperatively on 02/27/21 ?-left mastectomy on 03/27/21 with Dr. Donne Hazel showed: 2.1 cm grade 2 IDC; focal DCIS, two intraductal papilloma; negative margins and lymph nodes. ?-Oncotype RS of 1, low risk. ?-she discontinued anastrozole in 07/2021 due to increasing hot flashes and joint pain. I discussed tamoxifen with her, but she expressed concern with side effects. We will continue with surveillance alone. ?-she is clinically doing well. Labs reviewed, all WNL. Physical exam was unremarkable. There is no clinical concern for recurrence. ?-continue surveillance, mammogram due 01/2022, order in place ?  ?2. Genetics ?-testing done 02/27/21. Results were negative with VUS in CHEK2. ?  ?3. Bone Health ?-DEXA on 08/16/21 showed osteopenia (T-score of -1.8 at AP spine) ?  ?  ?PLAN:  ?-lab and f/u with NP Lacie in 1 year ? ? ?No problem-specific Assessment & Plan notes found for this  encounter. ? ? ?SUMMARY OF ONCOLOGIC HISTORY: ?Oncology History Overview Note  ?Cancer Staging ?Malignant neoplasm of upper-outer quadrant of left breast in female, estrogen receptor positive ([place]) ?Staging form: Breast, AJCC 8th Edition ?- Clinical stage from 02/19/2021: Stage IB (cT2, cN0, cM0, G2, ER+, PR+, HER2-) - Signed by Truitt Merle, MD on 02/26/2021 ?Stage prefix: Initial diagnosis ?Histologic grading system: 3 grade system ? ?  ?Malignant neoplasm of upper-outer quadrant of left breast in female, estrogen receptor positive ([place])  ?01/16/2021 Mammogram  ? DIGITAL SCREENING BILATERAL MAMMOGRAM WITH TOMOSYNTHESIS AND CAD ? ?IMPRESSION: ?Further evaluation is suggested for a possible mass in the left ?breast. ?  ?02/08/2021 Mammogram  ? DIGITAL DIAGNOSTIC UNILATERAL LEFT MAMMOGRAM WITH TOMOSYNTHESIS AND CAD; ULTRASOUND LEFT BREAST LIMITED ? ?IMPRESSION: ?1. Highly suspicious left breast mass at the 12 o'clock position ?corresponding with the screening mammographic findings. It measures 2.6 x 2.1 x 1.5 cm. Recommendation is for ultrasound-guided biopsy. ?2. Two additional indeterminate left breast masses at the 2 o'clock ?position 6 cm and 7 cm from the nipple. One of these likely ?corresponds with an additional mammographic mass. Recommendation is for ultrasound-guided biopsy. ?3. No suspicious left axillary lymphadenopathy. ?  ?  ?02/19/2021 Pathology Results  ? Diagnosis ?1. Breast, left, needle core biopsy, 2 o'clock 6 cmfn (ribbon) ?- INTRADUCTAL PAPILLOMA WITHOUT ATYPIA ?2. Breast, left, needle core biopsy, 2 o'clock 7 cmfn (coil) ?- DUCTAL CARCINOMA IN-SITU ARISING IN INTRADUCTAL PAPILLOMA ?- SEE COMMENT ?3. Breast, left, needle core biopsy, 12 o'clock (heart) ?- INVASIVE DUCTAL CARCINOMA ?- SEE COMMENT ?Microscopic Comment ?3. Based on the biopsy, the carcinoma appears Nottingham grade 2 of 3 and measures 0.7 cm  in greatest linear extent. ? ?3. PROGNOSTIC INDICATORS ?Results: ?IMMUNOHISTOCHEMICAL AND  MORPHOMETRIC ANALYSIS PERFORMED MANUALLY ?The tumor cells are EQUIVOCAL for Her2 (2+). Her2 by FISH will be performed and results reported separately. ?Estrogen Receptor: 95%, POSITIVE, STRONG STAINING INTENSITY ?Progesterone Receptor: 95%, POSITIVE, STRONG STAINING INTENSITY ?Proliferation Marker Ki67: 5% ? ?3. FLUORESCENCE IN-SITU HYBRIDIZATION ?Results: ?GROUP 5: HER2 **NEGATIVE** ?Equivocal form of amplification of the HER2 gene was detected in the IHC 2+ tissue sample received from this ?individual. HER2 FISH was performed by a technologist and cell imaging and analysis on the [employer]. ?RATIO OF HER2/CEN17 SIGNALS 1.43 ?AVERAGE HER2 COPY NUMBER PER CELL 2.00 ?  ?02/19/2021 Cancer Staging  ? Staging form: Breast, AJCC 8th Edition ?- Clinical stage from 02/19/2021: Stage IB (cT2, cN0, cM0, G2, ER+, PR+, HER2-) - Signed by Truitt Merle, MD on 02/26/2021 ?Stage prefix: Initial diagnosis ?Histologic grading system: 3 grade system ? ?  ?02/22/2021 Initial Diagnosis  ? Malignant neoplasm of upper-outer quadrant of left breast in female, estrogen receptor positive ([place]) ?  ?03/05/2021 Genetic Testing  ? No pathogenic variants detected in [facility] BRCAPlus Panel and [facility] CancerNext-Expanded +RNAinsight Panel.  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  Of note, at the time of testing, [facility] classified this variant as uncertain; however, other laboratories classify this as likely pathogenic.  The report dates are March 05, 2021 and March 18, 2021.  ? ?The BRCAplus panel offered by [employer] and includes sequencing and deletion/duplication analysis for the following 8 genes: ATM, BRCA1, BRCA2, CDH1, CHEK2, PALB2, PTEN, and TP53.  The CancerNext-Expanded gene panel offered by [facility] and includes sequencing, rearrangement, and RNA analysis for the following 77 genes: AIP, ALK, APC, ATM, AXIN2, BAP1, BARD1, BLM, BMPR1A, BRCA1, BRCA2, BRIP1, CDC73, CDH1, CDK4, CDKN1B, CDKN2A, CHEK2, CTNNA1,  DICER1, FANCC, FH, FLCN, GALNT12, KIF1B, LZTR1, MAX, MEN1, MET, MLH1, MSH2, MSH3, MSH6, MUTYH, NBN, NF1, NF2, NTHL1, PALB2, PHOX2B, PMS2, POT1, PRKAR1A, PTCH1, PTEN, RAD51C, RAD51D, RB1, RECQL, RET, SDHA, SDHAF2, SDHB, SDHC, SDHD, SMAD4, SMARCA4, SMARCB1, SMARCE1, STK11, SUFU, TMEM127, TP53, TSC1, TSC2, VHL and XRCC2 (sequencing and deletion/duplication); EGFR, EGLN1, HOXB13, KIT, MITF, PDGFRA, POLD1, and POLE (sequencing only); EPCAM and GREM1 (deletion/duplication only).  ?  ?03/27/2021 Cancer Staging  ? Staging form: Breast, AJCC 8th Edition ?- Pathologic stage from 03/27/2021: Stage IA (pT2, pN0, cM0, G2, ER+, PR+, HER2-) - Signed by Truitt Merle, MD on 05/01/2021 ?Stage prefix: Initial diagnosis ?Histologic grading system: 3 grade system ?Residual tumor (R): R0 - None ? ?  ?07/31/2021 Survivorship  ? SCP delivered by Cira Rue, NP ?  ? ? ? ?INTERVAL HISTORY:  ?JENNILEE DEMARCO is here for a follow up of breast cancer. She was last seen by me on 05/02/21 with survivorship visit in the interim. She presents to the clinic alone. ?She reports the side effects she experienced from anastrozole-- including hot flashes and joint pain-- have resolved since stopping the medicine. ?She notes some residual numbness s/p L mastectomy. ?  ?All other systems were reviewed with the patient and are negative. ? ?MEDICAL HISTORY:  ?Past Medical History:  ?Diagnosis Date  ? Allergy 2016, I think  ? Yellow jacket sting  ? Breast cancer ([place]) 02/2021  ? Eczema   ? Family history of breast cancer 02/27/2021  ? Family history of glioblastoma 02/27/2021  ? Family history of melanoma 02/27/2021  ? Hyperlipidemia   ? Hypertension   ? Obesity   ? Pre-diabetes   ? Skin cancer Not  sure of date  ? [facility]  ? Vitamin D deficiency   ? ? ?SURGICAL HISTORY: ?Past Surgical History:  ?Procedure Laterality Date  ? CESAREAN SECTION    ? x3  ? CHOLECYSTECTOMY    ? MASTECTOMY W/ SENTINEL NODE BIOPSY Left 03/27/2021  ? Procedure: LEFT  MASTECTOMY WITH LEFT AXILLARY SENTINEL LYMPH NODE BIOPSY;  Surgeon: Rolm Bookbinder, MD;  Location: [facility];  Service: General;  Laterality: Left;  ? TONSILLECTOMY    ? TUBAL LIGATION    ? Not sure of date  ? ? ?I have reviewed the soc

## 2021-10-31 ENCOUNTER — Telehealth: Payer: Self-pay | Admitting: Hematology

## 2021-10-31 NOTE — Telephone Encounter (Signed)
Left message with follow-up appointment per 3/22 los. ?

## 2021-11-20 NOTE — Progress Notes (Deleted)
Patient ID: ARAINA BUTRICK, female    DOB: [birth year]  MRN: [id number]  CC: Hypertension Follow-Up  Subjective: Gizell Danser is a 71 y.o. female who presents for hypertension follow-up.   Her concerns today include:  HYPERTENSION FOLLOW-UP: 05/27/2021 with Dorna Mai, MD: Readings at goal. Meds refilled. Monitoring labs ordered. continue - amLODipine (NORVASC) 5 MG tablet; Take 1 tablet (5 mg total) by mouth daily.  Dispense: 90 tablet; Refill: 1 - Basic Metabolic Panel - AST - ALT - Lipid Panel  11/26/2021:  2. HYPERLIPIDEMIA FOLLOW-UP: 05/27/2021 with Dorna Mai, MD: Elevated cholesterol levels - recommend low fat diet, increased activity/exercise, and fish oil if not contraindicated. Recheck in 6-12 months. All other labs are wnl and no further management is needed at this time.  11/26/2021:  3. PREDIABETES FOLLOW-UP:   Patient Active Problem List   Diagnosis Date Noted   S/P mastectomy, left 03/27/2021   Genetic testing 03/12/2021   Family history of breast cancer 02/27/2021   Family history of melanoma 02/27/2021   Family history of glioblastoma 02/27/2021   Malignant neoplasm of upper-outer quadrant of left breast in female, estrogen receptor positive ([place]) 02/22/2021   Essential hypertension, benign 01/24/2011   Hyperlipidemia 01/24/2011   Unspecified vitamin D deficiency 01/24/2011     Current Outpatient Medications on File Prior to Visit  Medication Sig Dispense Refill   acetaminophen (TYLENOL) 500 MG tablet Take 500 mg by mouth every 6 (six) hours as needed for moderate pain or headache.     amLODipine (NORVASC) 5 MG tablet Take 1 tablet (5 mg total) by mouth daily. 90 tablet 1   Cholecalciferol 25 MCG (1000 UT) CHEW Chew 1,000 Units by mouth daily.     erythromycin ophthalmic ointment Place a 1/2 inch ribbon of ointment into the lower eyelid 4 times daily for 7 days. 3.5 g 0   loteprednol (LOTEMAX) 0.5 % ophthalmic suspension 1 drop 4 (four)  times daily.     melatonin 3 MG TABS tablet Take 6 mg by mouth at bedtime as needed (sleep).     Multiple Vitamin (MULTIVITAMIN) capsule Take 1 capsule by mouth daily.     No current facility-administered medications on file prior to visit.    Allergies  Allergen Reactions   Zocor [Simvastatin]     Leg pain   Wasp Venom Protein Itching    Blood pressure goes up   Yellow Jacket Venom [Bee Venom] Itching    Blood pressure up    Social History   Socioeconomic History   Marital status: Divorced    Spouse name: Not on file   Number of children: 2   Years of education: 14   Highest education level: Bachelor's degree (e.g., BA, AB, BS)  Occupational History   Occupation: retire    Fish farm manager: [employer]    Comment: Estate manager/land agent  Tobacco Use   Smoking status: Never   Smokeless tobacco: Never  Vaping Use   Vaping Use: Never used  Substance and Sexual Activity   Alcohol use: Yes    Alcohol/week: 1.0 standard drink    Types: 1 Glasses of wine per week    Comment: Some weeks 0 wine   Drug use: No   Sexual activity: Not Currently    Birth control/protection: Diaphragm, I.U.D., Pill, Post-menopausal, Other-see comments    Comment: Tubal ligation  Other Topics Concern   Not on file  Social History Narrative   Not on file   Social Determinants of Health  Financial Resource Strain: Low Risk    Difficulty of Paying Living Expenses: Not hard at all  Food Insecurity: No Food Insecurity   Worried About Charity fundraiser in the Last Year: Never true   Ran Out of Food in the Last Year: Never true  Transportation Needs: No Transportation Needs   Lack of Transportation (Medical): No   Lack of Transportation (Non-Medical): No  Physical Activity: Insufficiently Active   Days of Exercise per Week: 7 days   Minutes of Exercise per Session: 20 min  Stress: No Stress Concern Present   Feeling of Stress : Not at all  Social Connections: Moderately Isolated   Frequency of  Communication with Friends and Family: More than three times a week   Frequency of Social Gatherings with Friends and Family: Once a week   Attends Religious Services: Never   Marine scientist or Organizations: No   Attends Music therapist: Never   Marital Status: Living with partner  Intimate Partner Violence: Not At Risk   Fear of Current or Ex-Partner: No   Emotionally Abused: No   Physically Abused: No   Sexually Abused: No    Family History  Problem Relation Age of Onset   Breast cancer Mother 17   Diabetes Father    Brain cancer Father    Cancer Father 28       glioblastoma   Breast cancer Maternal Aunt        dx after 35   Melanoma Maternal Uncle        dx after 50, arm    Past Surgical History:  Procedure Laterality Date   CESAREAN SECTION     x3   CHOLECYSTECTOMY     MASTECTOMY W/ SENTINEL NODE BIOPSY Left 03/27/2021   Procedure: LEFT MASTECTOMY WITH LEFT AXILLARY SENTINEL LYMPH NODE BIOPSY;  Surgeon: Rolm Bookbinder, MD;  Location: [place];  Service: General;  Laterality: Left;   TONSILLECTOMY     TUBAL LIGATION     Not sure of date    ROS: Review of Systems Negative except as stated above  PHYSICAL EXAM: There were no vitals taken for this visit.  Physical Exam  {female adult master:310786} {female adult master:310785}     Latest Ref Rng & Units 10/30/2021    1:54 PM 05/27/2021   11:15 AM 02/27/2021    8:34 AM  CMP  Glucose 70 - 99 mg/dL 88   93   105    BUN 8 - 23 mg/dL '19   15   13    '$ Creatinine 0.44 - 1.00 mg/dL 0.82   0.81   0.84    Sodium 135 - 145 mmol/L 139   141   140    Potassium 3.5 - 5.1 mmol/L 4.2   4.6   4.3    Chloride 98 - 111 mmol/L 103   102   105    CO2 22 - 32 mmol/L '28   23   26    '$ Calcium 8.9 - 10.3 mg/dL 10.2   9.9   9.7    Total Protein 6.5 - 8.1 g/dL 7.5    7.3    Total Bilirubin 0.3 - 1.2 mg/dL 0.7    0.6    Alkaline Phos 38 - 126 U/L 89    80    AST 15 - 41 U/L '17   19   19    '$ ALT 0 - 44 U/L 15    27  24     Lipid Panel     Component Value Date/Time   CHOL 242 (H) 05/27/2021 1115   TRIG 109 05/27/2021 1115   HDL 67 05/27/2021 1115   CHOLHDL 3.6 05/27/2021 1115   CHOLHDL 2.6 08/18/2013 0855   VLDL 18 08/18/2013 0855   LDLCALC 156 (H) 05/27/2021 1115    CBC    Component Value Date/Time   WBC 7.2 10/30/2021 1354   WBC 8.8 08/18/2013 0855   RBC 4.75 10/30/2021 1354   HGB 13.7 10/30/2021 1354   HCT 42.3 10/30/2021 1354   PLT 254 10/30/2021 1354   MCV 89.1 10/30/2021 1354   MCH 28.8 10/30/2021 1354   MCHC 32.4 10/30/2021 1354   RDW 13.6 10/30/2021 1354   LYMPHSABS 3.3 10/30/2021 1354   MONOABS 0.6 10/30/2021 1354   EOSABS 0.1 10/30/2021 1354   BASOSABS 0.0 10/30/2021 1354    ASSESSMENT AND PLAN:  There are no diagnoses linked to this encounter.   Patient was given the opportunity to ask questions.  Patient verbalized understanding of the plan and was able to repeat key elements of the plan. Patient was given clear instructions to go to Emergency Department or return to medical center if symptoms don't improve, worsen, or new problems develop.The patient verbalized understanding.   No orders of the defined types were placed in this encounter.    Requested Prescriptions    No prescriptions requested or ordered in this encounter    No follow-ups on file.  [name], NP

## 2021-11-25 ENCOUNTER — Ambulatory Visit: Payer: Medicare Other | Admitting: Family

## 2021-11-26 ENCOUNTER — Ambulatory Visit: Payer: Medicare Other | Admitting: Family

## 2021-11-26 DIAGNOSIS — E785 Hyperlipidemia, unspecified: Secondary | ICD-10-CM

## 2021-11-26 DIAGNOSIS — R7303 Prediabetes: Secondary | ICD-10-CM

## 2021-11-26 DIAGNOSIS — I10 Essential (primary) hypertension: Secondary | ICD-10-CM

## 2021-12-16 ENCOUNTER — Ambulatory Visit: Payer: Medicare Other | Attending: General Surgery

## 2021-12-16 VITALS — Wt 185.1 lb

## 2021-12-16 DIAGNOSIS — Z483 Aftercare following surgery for neoplasm: Secondary | ICD-10-CM | POA: Insufficient documentation

## 2021-12-16 NOTE — Therapy (Signed)
?  OUTPATIENT PHYSICAL THERAPY SOZO SCREENING NOTE ? ? ?Patient Name: Terri Hawkins ?MRN: [id number] ?DOB:[birth year], 71 y.o., female ?Today's Date: 12/16/2021 ? ?PCP: [name], NP ?REFERRING PROVIDER: Rolm Bookbinder, MD ? ? PT End of Session - 12/16/21 1101   ? ? Visit Number 6   # unchanged due to screen only  ? PT Start Time 1058   ? PT Stop Time 1102   ? PT Time Calculation (min) 4 min   ? Activity Tolerance Patient tolerated treatment well   ? Behavior During Therapy [facility] for tasks assessed/performed   ? ?  ?  ? ?  ? ? ?Past Medical History:  ?Diagnosis Date  ? Allergy 2016, I think  ? Yellow jacket sting  ? Breast cancer ([place]) 02/2021  ? Eczema   ? Family history of breast cancer 02/27/2021  ? Family history of glioblastoma 02/27/2021  ? Family history of melanoma 02/27/2021  ? Hyperlipidemia   ? Hypertension   ? Obesity   ? Pre-diabetes   ? Skin cancer Not sure of date  ? [facility]  ? Vitamin D deficiency   ? ?Past Surgical History:  ?Procedure Laterality Date  ? CESAREAN SECTION    ? x3  ? CHOLECYSTECTOMY    ? MASTECTOMY W/ SENTINEL NODE BIOPSY Left 03/27/2021  ? Procedure: LEFT MASTECTOMY WITH LEFT AXILLARY SENTINEL LYMPH NODE BIOPSY;  Surgeon: Rolm Bookbinder, MD;  Location: [facility];  Service: General;  Laterality: Left;  ? TONSILLECTOMY    ? TUBAL LIGATION    ? Not sure of date  ? ?Patient Active Problem List  ? Diagnosis Date Noted  ? S/P mastectomy, left 03/27/2021  ? Genetic testing 03/12/2021  ? Family history of breast cancer 02/27/2021  ? Family history of melanoma 02/27/2021  ? Family history of glioblastoma 02/27/2021  ? Malignant neoplasm of upper-outer quadrant of left breast in female, estrogen receptor positive ([place]) 02/22/2021  ? Essential hypertension, benign 01/24/2011  ? Hyperlipidemia 01/24/2011  ? Unspecified vitamin D deficiency 01/24/2011  ? ? ?REFERRING DIAG: left breast cancer at risk for lymphedema ? ?THERAPY DIAG:  ?Aftercare following surgery for  neoplasm ? ?PERTINENT HISTORY: Suspicious area found with screening mammogram.  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  She started neoadjuvant Anastrazole, but started having some sideeffects so is to discontinue for a week. Left mastectomy with SLNB performed on  03/27/2021. Drain removed on 04/05/2021  ? ?PRECAUTIONS: left UE Lymphedema risk, None ? ?SUBJECTIVE: Pt returns for her 3 month L-Dex screen.  ? ?PAIN:  ?Are you having pain? No ? ?SOZO SCREENING: ?Patient was assessed today using the SOZO machine to determine the lymphedema index score. This was compared to her baseline score. It was determined that she is within the recommended range when compared to her baseline and no further action is needed at this time. She will continue SOZO screenings. These are done every 3 months for 2 years post operatively followed by every 6 months for 2 years, and then annually. ? ? ? ?Otelia Limes, PTA ?12/16/2021, 11:03 AM ? ?  ? ?

## 2022-01-17 ENCOUNTER — Ambulatory Visit
Admission: RE | Admit: 2022-01-17 | Discharge: 2022-01-17 | Disposition: A | Discharge status: Home / Self Care | Payer: Medicare Other | Source: Ambulatory Visit | Attending: Nurse Practitioner | Admitting: Nurse Practitioner

## 2022-01-17 ENCOUNTER — Other Ambulatory Visit: Payer: Self-pay | Admitting: Nurse Practitioner

## 2022-01-17 DIAGNOSIS — Z853 Personal history of malignant neoplasm of breast: Secondary | ICD-10-CM

## 2022-01-17 DIAGNOSIS — Z1231 Encounter for screening mammogram for malignant neoplasm of breast: Secondary | ICD-10-CM

## 2022-05-05 ENCOUNTER — Ambulatory Visit: Payer: Medicare Other | Attending: General Surgery

## 2022-05-05 VITALS — Wt 191.4 lb

## 2022-05-05 DIAGNOSIS — Z483 Aftercare following surgery for neoplasm: Secondary | ICD-10-CM | POA: Insufficient documentation

## 2022-05-05 NOTE — Therapy (Signed)
OUTPATIENT PHYSICAL THERAPY SOZO SCREENING NOTE   Patient Name: Terri Hawkins MRN: [id number] DOB:1951-02-26, 71 y.o., female Today's Date: 05/05/2022  PCP: [name], NP REFERRING PROVIDER: Rolm Bookbinder, MD   PT End of Session - 05/05/22 1033     Visit Number 6   # unchanged due to screen only   PT Start Time 1031    PT Stop Time 1035    PT Time Calculation (min) 4 min    Activity Tolerance Patient tolerated treatment well    Behavior During Therapy [facility] for tasks assessed/performed             Past Medical History:  Diagnosis Date   Allergy 2016, I think   Yellow jacket sting   Breast cancer ([place]) 02/2021   Eczema    Family history of breast cancer 02/27/2021   Family history of glioblastoma 02/27/2021   Family history of melanoma 02/27/2021   Hyperlipidemia    Hypertension    Obesity    Pre-diabetes    Skin cancer Not sure of date   [facility]   Vitamin D deficiency    Past Surgical History:  Procedure Laterality Date   BREAST BIOPSY Left 02/2021   CESAREAN SECTION     x3   CHOLECYSTECTOMY     MASTECTOMY Left 03/27/2021   MASTECTOMY W/ SENTINEL NODE BIOPSY Left 03/27/2021   Procedure: LEFT MASTECTOMY WITH LEFT AXILLARY SENTINEL LYMPH NODE BIOPSY;  Surgeon: Rolm Bookbinder, MD;  Location: [place];  Service: General;  Laterality: Left;   TONSILLECTOMY     TUBAL LIGATION     Not sure of date   Patient Active Problem List   Diagnosis Date Noted   S/P mastectomy, left 03/27/2021   Genetic testing 03/12/2021   Family history of breast cancer 02/27/2021   Family history of melanoma 02/27/2021   Family history of glioblastoma 02/27/2021   Malignant neoplasm of upper-outer quadrant of left breast in female, estrogen receptor positive ([place]) 02/22/2021   Essential hypertension, benign 01/24/2011   Hyperlipidemia 01/24/2011   Unspecified vitamin D deficiency 01/24/2011    REFERRING DIAG: left breast cancer at risk for  lymphedema  THERAPY DIAG: Aftercare following surgery for neoplasm  PERTINENT HISTORY: Suspicious area found with screening mammogram.  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  She started neoadjuvant Anastrazole, but started having some sideeffects so is to discontinue for a week. Left mastectomy with SLNB performed on  03/27/2021. Drain removed on 04/05/2021   PRECAUTIONS: left UE Lymphedema risk, None  SUBJECTIVE: Pt returns for her 3 month L-Dex screen.   PAIN:  Are you having pain? No  SOZO SCREENING: Patient was assessed today using the SOZO machine to determine the lymphedema index score. This was compared to her baseline score. It was determined that she is within the recommended range when compared to her baseline and no further action is needed at this time. She will continue SOZO screenings. These are done every 3 months for 2 years post operatively followed by every 6 months for 2 years, and then annually.   L-DEX FLOWSHEETS - 05/05/22 1000       L-DEX LYMPHEDEMA SCREENING   Measurement Type Unilateral    L-DEX MEASUREMENT EXTREMITY Upper Extremity    POSITION  Standing    DOMINANT SIDE Left    At Risk Side Left    BASELINE SCORE (UNILATERAL) -6.1    L-DEX SCORE (UNILATERAL) -4.7  VALUE CHANGE (UNILAT) 1.4              Otelia Limes, PTA 05/05/2022, 10:34 AM

## 2022-07-22 ENCOUNTER — Ambulatory Visit: Payer: Medicare Other | Attending: General Surgery

## 2022-07-22 VITALS — Wt 198.1 lb

## 2022-07-22 DIAGNOSIS — Z483 Aftercare following surgery for neoplasm: Secondary | ICD-10-CM | POA: Insufficient documentation

## 2022-07-22 NOTE — Therapy (Signed)
OUTPATIENT PHYSICAL THERAPY SOZO SCREENING NOTE   Patient Name: Terri Hawkins MRN: [id number] DOB:10/21/1950, 71 y.o., female Today's Date: 07/22/2022  PCP: [name], NP REFERRING PROVIDER: Rolm Bookbinder, MD   PT End of Session - 07/22/22 1101     Visit Number 6   # unchanged due to screen only   PT Start Time 1058    PT Stop Time 1103    PT Time Calculation (min) 5 min    Activity Tolerance Patient tolerated treatment well    Behavior During Therapy [facility] for tasks assessed/performed             Past Medical History:  Diagnosis Date   Allergy 2016, I think   Yellow jacket sting   Breast cancer ([place]) 02/2021   Eczema    Family history of breast cancer 02/27/2021   Family history of glioblastoma 02/27/2021   Family history of melanoma 02/27/2021   Hyperlipidemia    Hypertension    Obesity    Pre-diabetes    Skin cancer Not sure of date   [facility]   Vitamin D deficiency    Past Surgical History:  Procedure Laterality Date   BREAST BIOPSY Left 02/2021   CESAREAN SECTION     x3   CHOLECYSTECTOMY     MASTECTOMY Left 03/27/2021   MASTECTOMY W/ SENTINEL NODE BIOPSY Left 03/27/2021   Procedure: LEFT MASTECTOMY WITH LEFT AXILLARY SENTINEL LYMPH NODE BIOPSY;  Surgeon: Rolm Bookbinder, MD;  Location: [facility];  Service: General;  Laterality: Left;   TONSILLECTOMY     TUBAL LIGATION     Not sure of date   Patient Active Problem List   Diagnosis Date Noted   S/P mastectomy, left 03/27/2021   Genetic testing 03/12/2021   Family history of breast cancer 02/27/2021   Family history of melanoma 02/27/2021   Family history of glioblastoma 02/27/2021   Malignant neoplasm of upper-outer quadrant of left breast in female, estrogen receptor positive ([place]) 02/22/2021   Essential hypertension, benign 01/24/2011   Hyperlipidemia 01/24/2011   Unspecified vitamin D deficiency 01/24/2011    REFERRING DIAG: left breast cancer at risk for  lymphedema  THERAPY DIAG: Aftercare following surgery for neoplasm  PERTINENT HISTORY: Suspicious area found with screening mammogram.  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  She started neoadjuvant Anastrazole, but started having some sideeffects so is to discontinue for a week. Left mastectomy with SLNB performed on  03/27/2021. Drain removed on 04/05/2021   PRECAUTIONS: left UE Lymphedema risk, None  SUBJECTIVE: Pt returns for her 3 month L-Dex screen.   PAIN:  Are you having pain? No  SOZO SCREENING: Patient was assessed today using the SOZO machine to determine the lymphedema index score. This was compared to her baseline score. It was determined that she is within the recommended range when compared to her baseline and no further action is needed at this time. She will continue SOZO screenings. These are done every 3 months for 2 years post operatively followed by every 6 months for 2 years, and then annually.   L-DEX FLOWSHEETS - 07/22/22 1100       L-DEX LYMPHEDEMA SCREENING   Measurement Type Unilateral    L-DEX MEASUREMENT EXTREMITY Upper Extremity    POSITION  Standing    DOMINANT SIDE Left    At Risk Side Left    BASELINE SCORE (UNILATERAL) -6.1    L-DEX SCORE (UNILATERAL) -1.8  VALUE CHANGE (UNILAT) 4.3              Otelia Limes, PTA 07/22/2022, 11:02 AM

## 2022-07-28 ENCOUNTER — Telehealth: Payer: Self-pay | Admitting: Family

## 2022-07-28 NOTE — Telephone Encounter (Signed)
Left message for patient to call back and schedule Medicare Annual Wellness Visit (AWV) either virtually or phone . Left  my jabber number [phone]   Last AWV 04/21/21 please schedule with Nurse Health Adviser   45 min for awv-i and in office appointments 30 min for awv-s  phone/virtual appointments  

## 2022-09-25 IMAGING — MG MM DIGITAL SCREENING BILAT W/ TOMO AND CAD
8 series · 8 of 24 positions shown · non-contrast
Comparison: Previous exam(s).

CLINICAL DATA: Screening.

EXAM:
DIGITAL SCREENING BILATERAL MAMMOGRAM WITH TOMOSYNTHESIS AND CAD
TECHNIQUE: Bilateral screening digital craniocaudal and mediolateral oblique
mammograms were obtained. Bilateral screening digital breast
tomosynthesis was performed. The images were evaluated with
computer-aided detection.

[R MLO synth-2D]
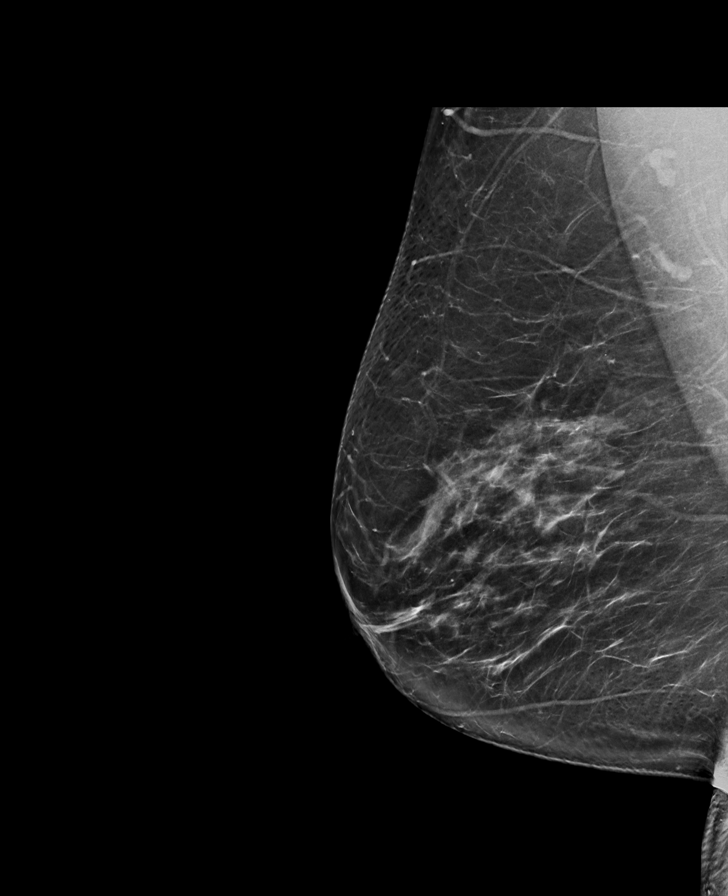

[L CC synth-2D]
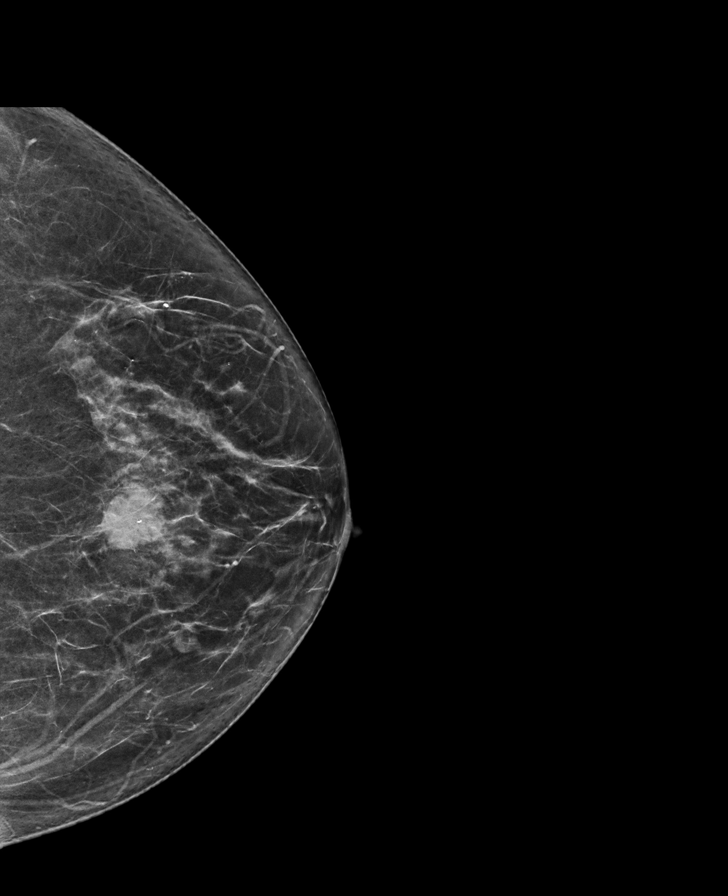

[R CC synth-2D]
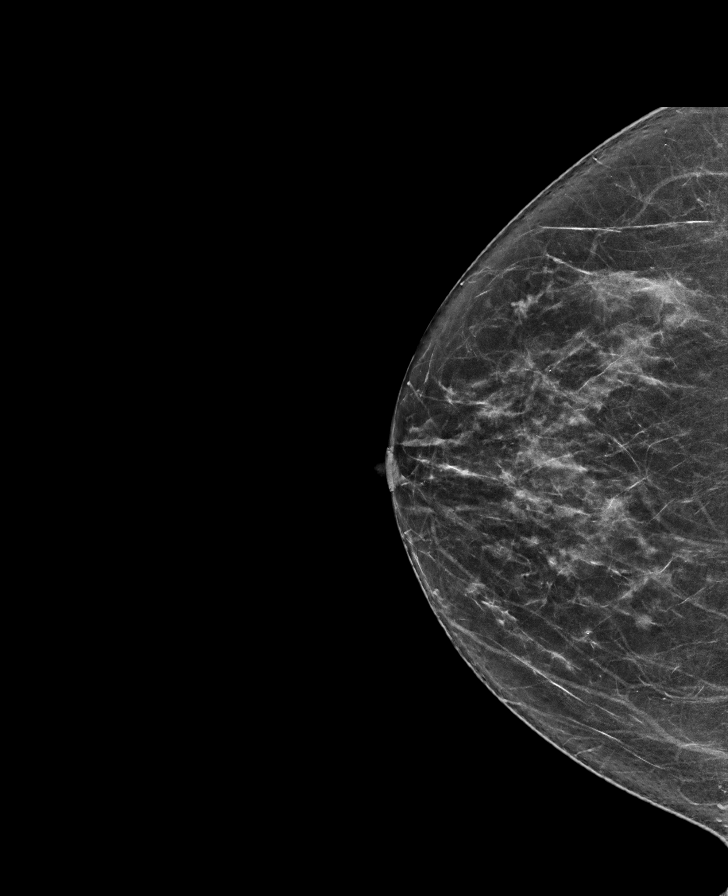

[L MLO synth-2D]
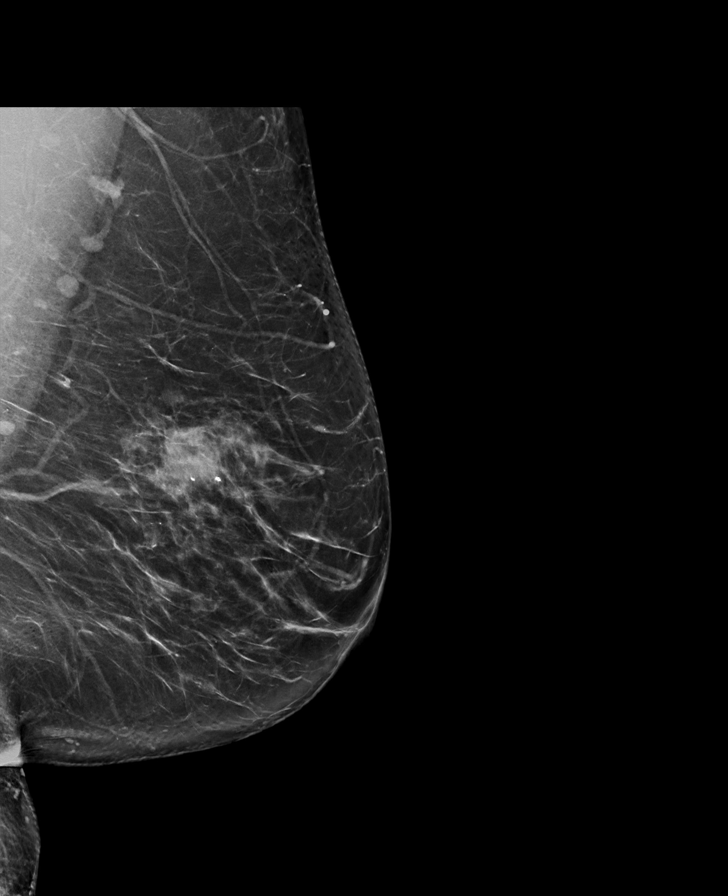

[R CC tomo · tomo slice 33/65.0]
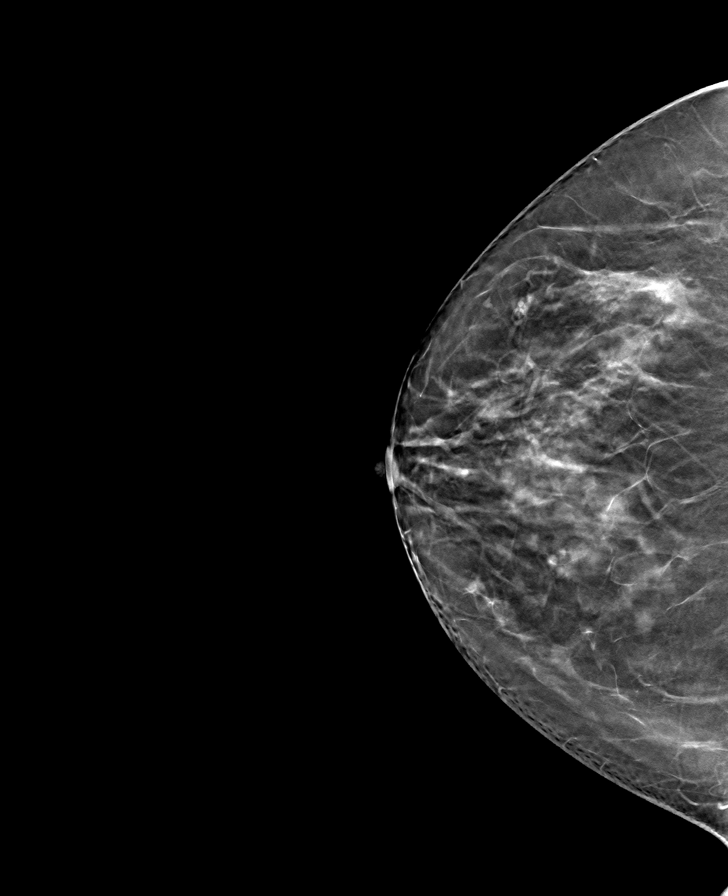

[L MLO tomo · tomo slice 41/80.0]
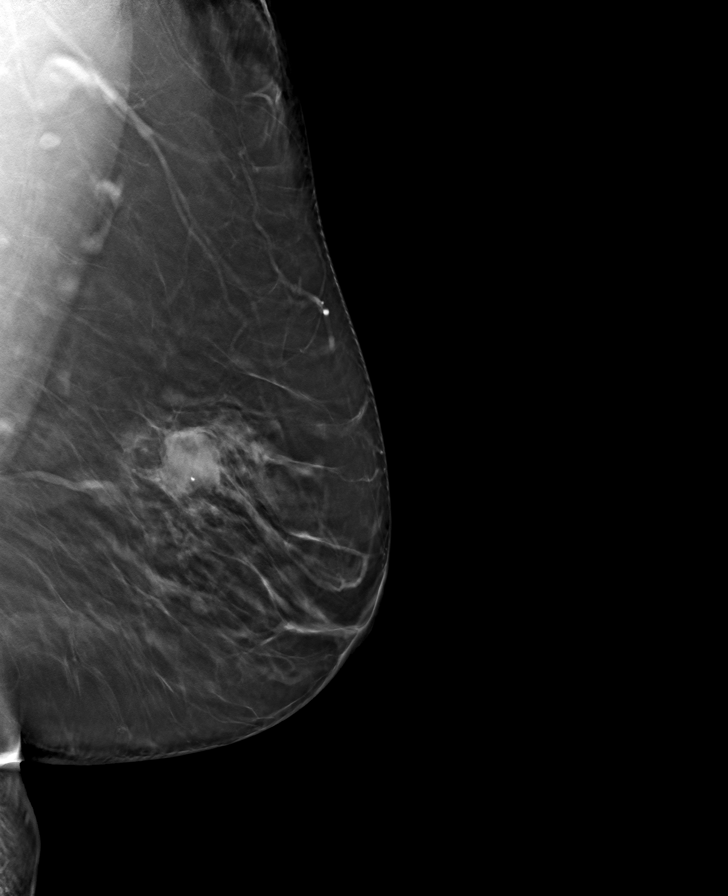

[R MLO tomo · tomo slice 39/78.0]
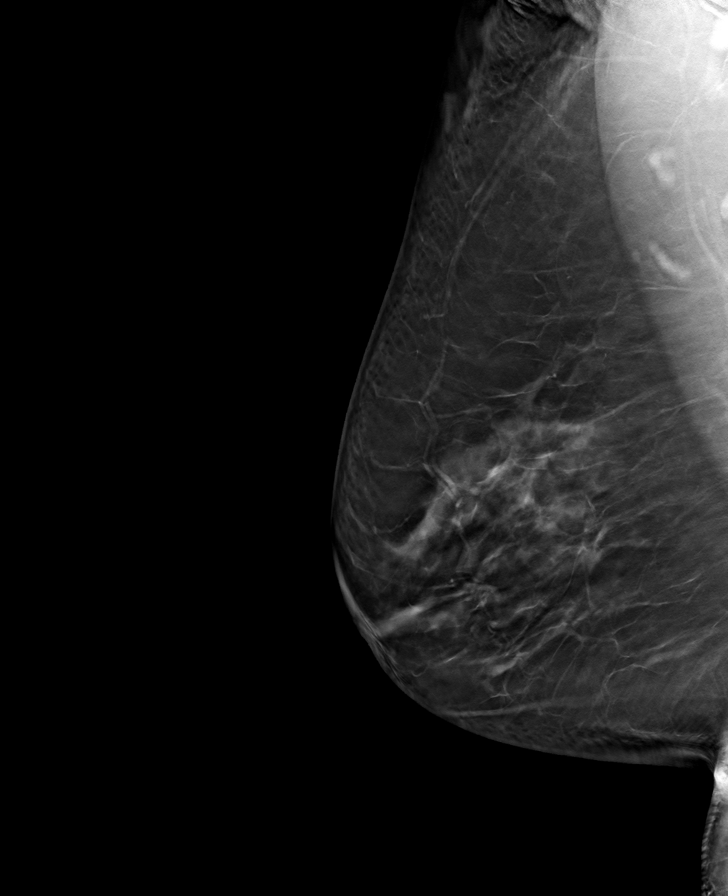

[L CC tomo · tomo slice 35/69.0]
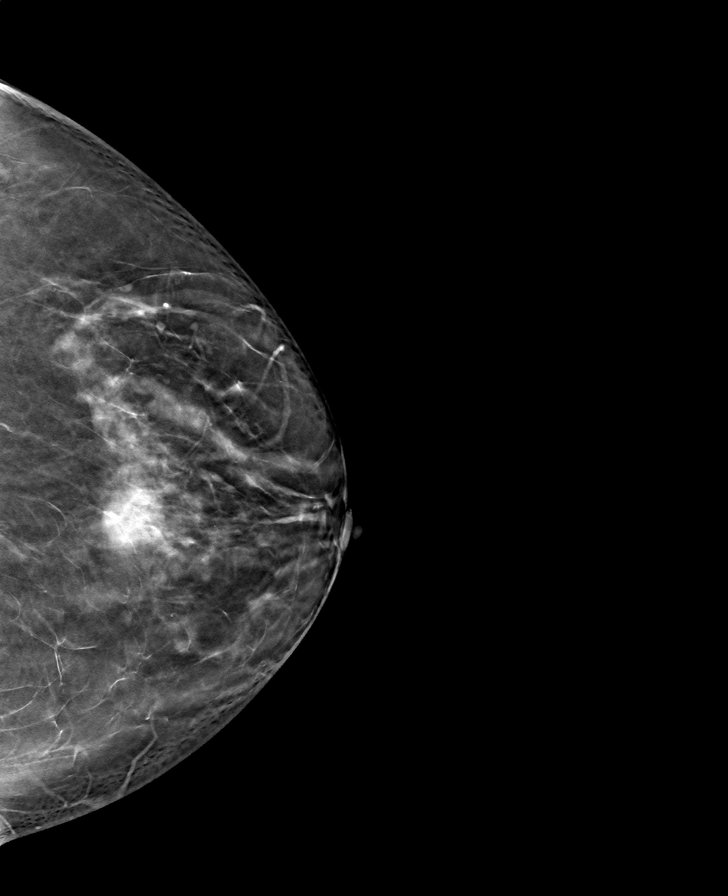

[8 of 24 positions shown; findings below may reference images not displayed]

ACR Breast Density Category b: There are scattered areas of
fibroglandular density.
FINDINGS: In the left breast, a possible mass warrants further evaluation. In
the right breast, no findings suspicious for malignancy.
IMPRESSION: Further evaluation is suggested for a possible mass in the left
breast.

RECOMMENDATION:
Diagnostic mammogram and possibly ultrasound of the left breast.
(Code:[id number]-X-44M)

The patient will be contacted regarding the findings, and additional
imaging will be scheduled.

BI-RADS CATEGORY  0: Incomplete. Need additional imaging evaluation
and/or prior mammograms for comparison.

## 2022-10-31 ENCOUNTER — Other Ambulatory Visit: Payer: Self-pay

## 2022-10-31 DIAGNOSIS — C50412 Malignant neoplasm of upper-outer quadrant of left female breast: Secondary | ICD-10-CM

## 2022-11-02 NOTE — Progress Notes (Signed)
Patient Care Team: Rema Fendt, NP as PCP - General (Nurse Practitioner) Pershing Proud, RN as Oncology Nurse Navigator Donnelly Angelica, RN as Oncology Nurse Navigator Malachy Mood, MD as Consulting Physician (Hematology) Emelia Loron, MD as Consulting Physician (General Surgery) Antony Blackbird, MD as Consulting Physician (Radiation Oncology) Pollyann Samples, NP as Nurse Practitioner (Nurse Practitioner)   CHIEF COMPLAINT: Follow up left breast cancer   Oncology History Overview Note  Cancer Staging Malignant neoplasm of upper-outer quadrant of left breast in female, estrogen receptor positive (HCC) Staging form: Breast, AJCC 8th Edition - Clinical stage from 02/19/2021: Stage IB (cT2, cN0, cM0, G2, ER+, PR+, HER2-) - Signed by Malachy Mood, MD on 02/26/2021 Stage prefix: Initial diagnosis Histologic grading system: 3 grade system    Malignant neoplasm of upper-outer quadrant of left breast in female, estrogen receptor positive (HCC)  01/16/2021 Mammogram   DIGITAL SCREENING BILATERAL MAMMOGRAM WITH TOMOSYNTHESIS AND CAD  IMPRESSION: Further evaluation is suggested for a possible mass in the left breast.   02/08/2021 Mammogram   DIGITAL DIAGNOSTIC UNILATERAL LEFT MAMMOGRAM WITH TOMOSYNTHESIS AND CAD; ULTRASOUND LEFT BREAST LIMITED  IMPRESSION: 1. Highly suspicious left breast mass at the 12 o'clock position corresponding with the screening mammographic findings. It measures 2.6 x 2.1 x 1.5 cm. Recommendation is for ultrasound-guided biopsy. 2. Two additional indeterminate left breast masses at the 2 o'clock position 6 cm and 7 cm from the nipple. One of these likely corresponds with an additional mammographic mass. Recommendation is for ultrasound-guided biopsy. 3. No suspicious left axillary lymphadenopathy.     02/19/2021 Pathology Results   Diagnosis 1. Breast, left, needle core biopsy, 2 o'clock 6 cmfn (ribbon) - INTRADUCTAL PAPILLOMA WITHOUT ATYPIA 2. Breast,  left, needle core biopsy, 2 o'clock 7 cmfn (coil) - DUCTAL CARCINOMA IN-SITU ARISING IN INTRADUCTAL PAPILLOMA - SEE COMMENT 3. Breast, left, needle core biopsy, 12 o'clock (heart) - INVASIVE DUCTAL CARCINOMA - SEE COMMENT Microscopic Comment 3. Based on the biopsy, the carcinoma appears Nottingham grade 2 of 3 and measures 0.7 cm in greatest linear extent.  3. PROGNOSTIC INDICATORS Results: IMMUNOHISTOCHEMICAL AND MORPHOMETRIC ANALYSIS PERFORMED MANUALLY The tumor cells are EQUIVOCAL for Her2 (2+). Her2 by FISH will be performed and results reported separately. Estrogen Receptor: 95%, POSITIVE, STRONG STAINING INTENSITY Progesterone Receptor: 95%, POSITIVE, STRONG STAINING INTENSITY Proliferation Marker Ki67: 5%  3. FLUORESCENCE IN-SITU HYBRIDIZATION Results: GROUP 5: HER2 **NEGATIVE** Equivocal form of amplification of the HER2 gene was detected in the IHC 2+ tissue sample received from this individual. HER2 FISH was performed by a technologist and cell imaging and analysis on the [employer]. RATIO OF HER2/CEN17 SIGNALS 1.43 AVERAGE HER2 COPY NUMBER PER CELL 2.00   02/19/2021 Cancer Staging   Staging form: Breast, AJCC 8th Edition - Clinical stage from 02/19/2021: Stage IB (cT2, cN0, cM0, G2, ER+, PR+, HER2-) - Signed by Malachy Mood, MD on 02/26/2021 Stage prefix: Initial diagnosis Histologic grading system: 3 grade system   02/22/2021 Initial Diagnosis   Malignant neoplasm of upper-outer quadrant of left breast in female, estrogen receptor positive (HCC)   03/05/2021 Genetic Testing   No pathogenic variants detected in [facility] BRCAPlus Panel and [facility] CancerNext-Expanded +RNAinsight Panel.  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  Of note, at the time of testing, [facility] classified this variant as uncertain; however, other laboratories classify this as likely pathogenic.  The report dates are March 05, 2021 and March 18, 2021.   The BRCAplus panel offered  by  [facility] and includes sequencing and deletion/duplication analysis for the following 8 genes: ATM, BRCA1, BRCA2, CDH1, CHEK2, PALB2, PTEN, and TP53.  The CancerNext-Expanded gene panel offered by [facility] and includes sequencing, rearrangement, and RNA analysis for the following 77 genes: AIP, ALK, APC, ATM, AXIN2, BAP1, BARD1, BLM, BMPR1A, BRCA1, BRCA2, BRIP1, CDC73, CDH1, CDK4, CDKN1B, CDKN2A, CHEK2, CTNNA1, DICER1, FANCC, FH, FLCN, GALNT12, KIF1B, LZTR1, MAX, MEN1, MET, MLH1, MSH2, MSH3, MSH6, MUTYH, NBN, NF1, NF2, NTHL1, PALB2, PHOX2B, PMS2, POT1, PRKAR1A, PTCH1, PTEN, RAD51C, RAD51D, RB1, RECQL, RET, SDHA, SDHAF2, SDHB, SDHC, SDHD, SMAD4, SMARCA4, SMARCB1, SMARCE1, STK11, SUFU, TMEM127, TP53, TSC1, TSC2, VHL and XRCC2 (sequencing and deletion/duplication); EGFR, EGLN1, HOXB13, KIT, MITF, PDGFRA, POLD1, and POLE (sequencing only); EPCAM and GREM1 (deletion/duplication only).    03/27/2021 Cancer Staging   Staging form: Breast, AJCC 8th Edition - Pathologic stage from 03/27/2021: Stage IA (pT2, pN0, cM0, G2, ER+, PR+, HER2-) - Signed by Malachy Mood, MD on 05/01/2021 Stage prefix: Initial diagnosis Histologic grading system: 3 grade system Residual tumor (R): R0 - None   07/31/2021 Survivorship   SCP delivered by Santiago Glad, NP      CURRENT THERAPY: Surveillance  INTERVAL HISTORY  Ms. Terri Hawkins returns for follow up as scheduled. Last seen by Dr. Mosetta Putt 10/30/21. Right mammogram 01/17/22 showed no evidence of malignancy. Denies changes in her health in the past year except stopping BP medication due to nocturia. She has not seen PCP in a while.   ROS  All other systems reviewed and negative   Past Medical History:  Diagnosis Date   Allergy 2016, I think   Yellow jacket sting   Breast cancer (HCC) 02/2021   Eczema    Family history of breast cancer 02/27/2021   Family history of glioblastoma 02/27/2021   Family history of melanoma 02/27/2021   Hyperlipidemia    Hypertension     Obesity    Pre-diabetes    Skin cancer Not sure of date   [facility]   Vitamin D deficiency      Past Surgical History:  Procedure Laterality Date   BREAST BIOPSY Left 02/2021   CESAREAN SECTION     x3   CHOLECYSTECTOMY     MASTECTOMY Left 03/27/2021   MASTECTOMY W/ SENTINEL NODE BIOPSY Left 03/27/2021   Procedure: LEFT MASTECTOMY WITH LEFT AXILLARY SENTINEL LYMPH NODE BIOPSY;  Surgeon: Emelia Loron, MD;  Location: [facility];  Service: General;  Laterality: Left;   TONSILLECTOMY     TUBAL LIGATION     Not sure of date     Outpatient Encounter Medications as of 11/03/2022  Medication Sig   acetaminophen (TYLENOL) 500 MG tablet Take 500 mg by mouth every 6 (six) hours as needed for moderate pain or headache.   Cholecalciferol 25 MCG (1000 UT) CHEW Chew 1,000 Units by mouth daily.   melatonin 3 MG TABS tablet Take 6 mg by mouth at bedtime as needed (sleep).   Multiple Vitamin (MULTIVITAMIN) capsule Take 1 capsule by mouth daily.   [DISCONTINUED] amLODipine (NORVASC) 5 MG tablet Take 1 tablet (5 mg total) by mouth daily.   [DISCONTINUED] erythromycin ophthalmic ointment Place a 1/2 inch ribbon of ointment into the lower eyelid 4 times daily for 7 days.   [DISCONTINUED] loteprednol (LOTEMAX) 0.5 % ophthalmic suspension 1 drop 4 (four) times daily.   No facility-administered encounter medications on file as of 11/03/2022.     Today's Vitals   11/03/22 1005  BP: (!) 150/80  Pulse: 73  Resp: 18  Temp: 97.9 F (36.6 C)  TempSrc: Oral  SpO2: 98%  Weight: 196 lb 6.4 oz (89.1 kg)  Height: 5\' 4"  (1.626 m)   Body mass index is 33.71 kg/m.   PHYSICAL EXAM GENERAL:alert, no distress and comfortable SKIN: no rash  EYES: sclera clear NECK: without mass LYMPH:  no palpable cervical or supraclavicular lymphadenopathy  LUNGS: clear with normal breathing effort HEART: regular rate & rhythm, no lower extremity edema ABDOMEN: abdomen soft, non-tender and normal bowel  sounds NEURO: alert & oriented x 3 with fluent speech, no focal motor/sensory deficits Breast exam: S/P left mastectomy, incisions completely healed.  No palpable mass or nodularity along the left chest wall, right breast, or either axilla that I could appreciate.   CBC    Component Value Date/Time   WBC 11.6 (H) 11/03/2022 0947   WBC 8.8 08/18/2013 0855   RBC 4.64 11/03/2022 0947   HGB 14.0 11/03/2022 0947   HCT 41.4 11/03/2022 0947   PLT 268 11/03/2022 0947   MCV 89.2 11/03/2022 0947   MCH 30.2 11/03/2022 0947   MCHC 33.8 11/03/2022 0947   RDW 13.2 11/03/2022 0947   LYMPHSABS 3.2 11/03/2022 0947   MONOABS 0.7 11/03/2022 0947   EOSABS 0.1 11/03/2022 0947   BASOSABS 0.0 11/03/2022 0947     CMP     Component Value Date/Time   NA 138 11/03/2022 0947   NA 141 05/27/2021 1115   K 4.4 11/03/2022 0947   CL 103 11/03/2022 0947   CO2 28 11/03/2022 0947   GLUCOSE 111 (H) 11/03/2022 0947   BUN 18 11/03/2022 0947   BUN 15 05/27/2021 1115   CREATININE 0.87 11/03/2022 0947   CREATININE 0.75 08/18/2013 0855   CALCIUM 10.1 11/03/2022 0947   PROT 7.4 11/03/2022 0947   PROT 7.3 05/02/2019 1425   ALBUMIN 4.6 11/03/2022 0947   ALBUMIN 4.8 05/02/2019 1425   AST 16 11/03/2022 0947   ALT 17 11/03/2022 0947   ALKPHOS 81 11/03/2022 0947   BILITOT 0.7 11/03/2022 0947   GFRNONAA >60 11/03/2022 0947   GFRAA 78 05/02/2019 1425     ASSESSMENT & PLAN:Terri Hawkins is a 72 y.o. post-menopausal female with    1. Malignant neoplasm of upper-outer quadrant of left breast, Invasive Ductal Carcinoma, Stage IB , pT2N0 cM0, ER+/PR+/HER2-, Grade 2  -Diagnosed 02/2021, she started anastrozole preoperatively on 02/27/21 and s/p left mastectomy on 03/27/21 with Dr. Dwain Sarna  -Oncotype RS of 1, low risk. -she discontinued anastrozole in 07/2021 due to increasing hot flashes and joint pain, and was not interested in Tamoxifen.  -Currently under surveillance, right mammogram 01/2022 was negative  -Ms.  Croston is clinically doing well from a breast cancer standpoint.  Breast exam is benign, labs are unremarkable.  Overall no clinical concern for recurrence -Continue breast cancer surveillance and annual follow-up   2. Genetics -testing done 02/27/21. Results were negative with VUS in CHEK2.   3. Bone Health -DEXA on 08/16/21 showed osteopenia (T-score of -1.8 at [facility] spine) -Repeat per PCP -on Vit D  4.  Health maintenance -She is overdue for wellness exam.  -Stopped BP med on her own due to nocturia, BP 150/80 today -I reviewed the risks of uncontrolled HTN and recommend she see PCP to discuss further management      PLAN: -Labs reviewed -Continue breast cancer surveillance -Right mammo 01/2023 -Return to PCP for routine/age-appropriate health care and HTN f/up -Follow-up in 1 year, or sooner if needed  Orders Placed This Encounter  Procedures   MM 3D SCREENING MAMMOGRAM UNILATERAL RIGHT BREAST    Standing Status:   Future    Standing Expiration Date:   11/03/2023    Order Specific Question:   Reason for Exam (SYMPTOM  OR DIAGNOSIS REQUIRED)    Answer:   left breast cancer s/p mastectomy 2022    Order Specific Question:   Preferred imaging location?    Answer:   [facility]      All questions were answered. The patient knows to call the clinic with any problems, questions or concerns. No barriers to learning were detected.   Santiago Glad, NP-C 11/03/2022

## 2022-11-03 ENCOUNTER — Inpatient Hospital Stay: Payer: Medicare Other | Attending: Nurse Practitioner | Admitting: Nurse Practitioner

## 2022-11-03 ENCOUNTER — Encounter: Payer: Self-pay | Admitting: Nurse Practitioner

## 2022-11-03 ENCOUNTER — Inpatient Hospital Stay: Payer: Medicare Other

## 2022-11-03 VITALS — BP 150/80 | HR 73 | Temp 97.9°F | Resp 18 | Ht 64.0 in | Wt 196.4 lb

## 2022-11-03 DIAGNOSIS — Z9012 Acquired absence of left breast and nipple: Secondary | ICD-10-CM | POA: Insufficient documentation

## 2022-11-03 DIAGNOSIS — Z1231 Encounter for screening mammogram for malignant neoplasm of breast: Secondary | ICD-10-CM

## 2022-11-03 DIAGNOSIS — Z79899 Other long term (current) drug therapy: Secondary | ICD-10-CM | POA: Insufficient documentation

## 2022-11-03 DIAGNOSIS — Z853 Personal history of malignant neoplasm of breast: Secondary | ICD-10-CM | POA: Insufficient documentation

## 2022-11-03 DIAGNOSIS — I10 Essential (primary) hypertension: Secondary | ICD-10-CM | POA: Diagnosis not present

## 2022-11-03 DIAGNOSIS — Z170 Estrogen receptor positive status [ER+]: Secondary | ICD-10-CM

## 2022-11-03 DIAGNOSIS — C50412 Malignant neoplasm of upper-outer quadrant of left female breast: Secondary | ICD-10-CM

## 2022-11-03 DIAGNOSIS — M8588 Other specified disorders of bone density and structure, other site: Secondary | ICD-10-CM | POA: Insufficient documentation

## 2022-11-03 LAB — CBC WITH DIFFERENTIAL (CANCER CENTER ONLY)
Abs Immature Granulocytes: 0.03 10*3/uL (ref 0.00–0.07)
Basophils Absolute: 0 10*3/uL (ref 0.0–0.1)
Basophils Relative: 0 %
Eosinophils Absolute: 0.1 10*3/uL (ref 0.0–0.5)
Eosinophils Relative: 1 %
HCT: 41.4 % (ref 36.0–46.0)
Hemoglobin: 14 g/dL (ref 12.0–15.0)
Immature Granulocytes: 0 %
Lymphocytes Relative: 27 %
Lymphs Abs: 3.2 10*3/uL (ref 0.7–4.0)
MCH: 30.2 pg (ref 26.0–34.0)
MCHC: 33.8 g/dL (ref 30.0–36.0)
MCV: 89.2 fL (ref 80.0–100.0)
Monocytes Absolute: 0.7 10*3/uL (ref 0.1–1.0)
Monocytes Relative: 6 %
Neutro Abs: 7.6 10*3/uL (ref 1.7–7.7)
Neutrophils Relative %: 66 %
Platelet Count: 268 10*3/uL (ref 150–400)
RBC: 4.64 MIL/uL (ref 3.87–5.11)
RDW: 13.2 % (ref 11.5–15.5)
WBC Count: 11.6 10*3/uL — ABNORMAL HIGH (ref 4.0–10.5)
nRBC: 0 % (ref 0.0–0.2)

## 2022-11-03 LAB — CMP (CANCER CENTER ONLY)
ALT: 17 U/L (ref 0–44)
AST: 16 U/L (ref 15–41)
Albumin: 4.6 g/dL (ref 3.5–5.0)
Alkaline Phosphatase: 81 U/L (ref 38–126)
Anion gap: 7 (ref 5–15)
BUN: 18 mg/dL (ref 8–23)
CO2: 28 mmol/L (ref 22–32)
Calcium: 10.1 mg/dL (ref 8.9–10.3)
Chloride: 103 mmol/L (ref 98–111)
Creatinine: 0.87 mg/dL (ref 0.44–1.00)
GFR, Estimated: >60 mL/min (ref >60)
Glucose, Bld: 111 mg/dL — ABNORMAL HIGH (ref 70–99)
Potassium: 4.4 mmol/L (ref 3.5–5.1)
Sodium: 138 mmol/L (ref 135–145)
Total Bilirubin: 0.7 mg/dL (ref 0.3–1.2)
Total Protein: 7.4 g/dL (ref 6.5–8.1)

## 2022-11-16 ENCOUNTER — Ambulatory Visit
Admission: EM | Admit: 2022-11-16 | Discharge: 2022-11-16 | Disposition: A | Discharge status: Home / Self Care | Payer: PRIVATE HEALTH INSURANCE | Attending: Internal Medicine | Admitting: Internal Medicine

## 2022-11-16 DIAGNOSIS — R21 Rash and other nonspecific skin eruption: Secondary | ICD-10-CM | POA: Diagnosis not present

## 2022-11-16 MED ORDER — CEPHALEXIN 500 MG PO CAPS: 500.0000 mg | ORAL_CAPSULE | Freq: Four times a day (QID) | ORAL | 0 refills | Status: AC

## 2022-11-16 MED ORDER — CICLOPIROX OLAMINE 0.77 % EX CREA: TOPICAL_CREAM | Freq: Two times a day (BID) | CUTANEOUS | 0 refills | Status: DC

## 2022-11-16 NOTE — ED Triage Notes (Signed)
3-day history of left foot rash. Pt stated she has history of athlete foot.

## 2022-11-16 NOTE — ED Provider Notes (Signed)
[name]    CSN: [id number] Arrival date & time: 11/16/22  1050      History   Chief Complaint Chief Complaint  Patient presents with   Rash    HPI Terri Hawkins is a 72 y.o. female.   Patient presents with redness and itchiness to left foot.  She reports that this has been an intermittent problem for multiple years.  It flared up over the past few days after she used clotrimazole topically.  She reports she was prescribed this by PCP for foot fungus "a while back".  Patient has also been using Tinactin with no improvement.  She has never seen a podiatrist for this.  Denies any associated fever or injury to the area.  Reports that she has eczema on that foot so is not sure if this is related.   Rash   Past Medical History:  Diagnosis Date   Allergy 2016, I think   Yellow jacket sting   Breast cancer 02/2021   Eczema    Family history of breast cancer 02/27/2021   Family history of glioblastoma 02/27/2021   Family history of melanoma 02/27/2021   Hyperlipidemia    Hypertension    Obesity    Pre-diabetes    Skin cancer Not sure of date   [facility]   Vitamin D deficiency     Patient Active Problem List   Diagnosis Date Noted   S/P mastectomy, left 03/27/2021   Genetic testing 03/12/2021   Family history of breast cancer 02/27/2021   Family history of melanoma 02/27/2021   Family history of glioblastoma 02/27/2021   Malignant neoplasm of upper-outer quadrant of left breast in female, estrogen receptor positive 02/22/2021   Essential hypertension, benign 01/24/2011   Hyperlipidemia 01/24/2011   Unspecified vitamin D deficiency 01/24/2011    Past Surgical History:  Procedure Laterality Date   BREAST BIOPSY Left 02/2021   CESAREAN SECTION     x3   CHOLECYSTECTOMY     MASTECTOMY Left 03/27/2021   MASTECTOMY W/ SENTINEL NODE BIOPSY Left 03/27/2021   Procedure: LEFT MASTECTOMY WITH LEFT AXILLARY SENTINEL LYMPH NODE BIOPSY;   Surgeon: Emelia LoronWakefield, Matthew, MD;  Location: [facility] OR;  Service: General;  Laterality: Left;   TONSILLECTOMY     TUBAL LIGATION     Not sure of date    OB History   No obstetric history on file.      Home Medications    Prior to Admission medications   Medication Sig Start Date End Date Taking? Authorizing Provider  cephALEXin (KEFLEX) 500 MG capsule Take 1 capsule (500 mg total) by mouth 4 (four) times daily for 5 days. 11/16/22 11/21/22 Yes Kahla Risdon, Acie FredricksonHaley E, FNP  ciclopirox (LOPROX) 0.77 % cream Apply topically 2 (two) times daily. 11/16/22  Yes Abryanna Musolino, Rolly SalterHaley E, FNP  acetaminophen (TYLENOL) 500 MG tablet Take 500 mg by mouth every 6 (six) hours as needed for moderate pain or headache.    [provider]  Cholecalciferol 25 MCG (1000 UT) CHEW Chew 1,000 Units by mouth daily.    [provider]  melatonin 3 MG TABS tablet Take 6 mg by mouth at bedtime as needed (sleep).    [provider]  Multiple Vitamin (MULTIVITAMIN) capsule Take 1 capsule by mouth daily.    [provider]    Family History Family History  Problem Relation Age of Onset   Breast cancer Mother 4048   Diabetes Father    Brain cancer Father  Cancer Father 50       glioblastoma   Breast cancer Maternal Aunt        dx after 50   Melanoma Maternal Uncle        dx after 50, arm    Social History Social History   Tobacco Use   Smoking status: Never   Smokeless tobacco: Never  Vaping Use   Vaping Use: Never used  Substance Use Topics   Alcohol use: Yes    Alcohol/week: 1.0 standard drink of alcohol    Types: 1 Glasses of wine per week    Comment: Some weeks 0 wine   Drug use: No     Allergies   Zocor [simvastatin], Wasp venom protein, and Yellow jacket venom [bee venom]   Review of Systems Review of Systems Per HPI  Physical Exam Triage Vital Signs ED Triage Vitals [11/16/22 1123]  Enc Vitals Group     BP (!) 150/76     Pulse Rate 94     Resp 18     Temp 97.6 F  (36.4 C)     Temp Source Oral     SpO2 94 %     Weight      Height      Head Circumference      Peak Flow      Pain Score      Pain Loc      Pain Edu?      Excl. in GC?    No data found.  Updated Vital Signs BP (!) 150/76 (BP Location: Left Arm)   Pulse 94   Temp 97.6 F (36.4 C) (Oral)   Resp 18   SpO2 94%   Visual Acuity Right Eye Distance:   Left Eye Distance:   Bilateral Distance:    Right Eye Near:   Left Eye Near:    Bilateral Near:     Physical Exam Constitutional:      General: She is not in acute distress.    Appearance: Normal appearance. She is not toxic-appearing or diaphoretic.  HENT:     Head: Normocephalic and atraumatic.  Eyes:     Extraocular Movements: Extraocular movements intact.     Conjunctiva/sclera: Conjunctivae normal.  Pulmonary:     Effort: Pulmonary effort is normal.  Feet:     Comments: Patient has mild erythema present to the dorsal surface of the foot.  Patient also has scaling and erythema to the bilateral portions of the foot.  Toenails are intact with no obvious discoloration.  Capillary refill and pulses intact.  No lacerations or abrasions noted. Neurological:     General: No focal deficit present.     Mental Status: She is alert and oriented to person, place, and time. Mental status is at baseline.  Psychiatric:        Mood and Affect: Mood normal.        Behavior: Behavior normal.        Thought Content: Thought content normal.        Judgment: Judgment normal.      UC Treatments / Results  Labs (all labs ordered are listed, but only abnormal results are displayed) Labs Reviewed - No data to display  EKG   Radiology No results found.  Procedures Procedures (including critical care time)  Medications Ordered in UC Medications - No data to display  Initial Impression / Assessment and Plan / UC Course  I have reviewed the triage vital signs and the nursing notes.  Pertinent labs & imaging results that were  available during my care of the patient were reviewed by me and considered in my medical decision making (see chart for details).     Unsure exact etiology of patient's foot rash but differential diagnoses include eczema versus contact dermatitis versus tinea pedis.  Mildly concerned about bacterial infection of the skin given mild swelling and warmth noted.  Therefore, will treat with cephalexin antibiotic.  Creatinine clearance is normal so no dosage adjustment necessary.  Ciclopirox cream prescribed to help alleviate any type of tinea pedis.  Could also be eczema related but I do think that follow-up with podiatry is necessary next step and patient was provided with contact information for podiatry for follow-up.  No signs of sepsis or septic joint.  Advised strict follow-up precautions.  Patient verbalized understanding and was agreeable with plan. Final Clinical Impressions(s) / UC Diagnoses   Final diagnoses:  Rash of foot     Discharge Instructions      I have prescribed you an oral antibiotic and a topical medication.  The antibiotic will treat any bacterial infection of the skin and the topical medication will treat foot fungus.  Please follow-up with podiatry tomorrow to schedule appointment for further evaluation.    ED Prescriptions     Medication Sig Dispense Auth. Provider   cephALEXin (KEFLEX) 500 MG capsule Take 1 capsule (500 mg total) by mouth 4 (four) times daily for 5 days. 20 capsule [place], [place] E, [place]   ciclopirox (LOPROX) 0.77 % cream Apply topically 2 (two) times daily. 15 g Gustavus Bryant, [place]      PDMP not reviewed this encounter.   Gustavus Bryant, [place] 11/16/22 1250

## 2022-11-16 NOTE — Discharge Instructions (Addendum)
I have prescribed you an oral antibiotic and a topical medication.  The antibiotic will treat any bacterial infection of the skin and the topical medication will treat foot fungus.  Please follow-up with podiatry tomorrow to schedule appointment for further evaluation.

## 2022-11-17 ENCOUNTER — Ambulatory Visit: Payer: Medicare Other | Attending: General Surgery

## 2022-11-17 VITALS — Wt 193.5 lb

## 2022-11-17 DIAGNOSIS — Z483 Aftercare following surgery for neoplasm: Secondary | ICD-10-CM | POA: Insufficient documentation

## 2022-11-17 NOTE — Therapy (Signed)
OUTPATIENT PHYSICAL THERAPY SOZO SCREENING NOTE   Patient Name: Terri Hawkins MRN: [id number] DOB:Mar 04, 1951, 72 y.o., female Today's Date: 11/17/2022  PCP: Rema FendtStephens, Amy J, NP REFERRING PROVIDER: Emelia LoronWakefield, Matthew, MD   PT End of Session - 11/17/22 1047     Visit Number 6   # unchanged due to screen only   PT Start Time 1046    PT Stop Time 1050    PT Time Calculation (min) 4 min    Activity Tolerance Patient tolerated treatment well    Behavior [facility] tasks assessed/performed             Past Medical History:  Diagnosis Date   Allergy 2016, I think   Yellow jacket sting   Breast cancer 02/2021   Eczema    Family history of breast cancer 02/27/2021   Family history of glioblastoma 02/27/2021   Family history of melanoma 02/27/2021   Hyperlipidemia    Hypertension    Obesity    Pre-diabetes    Skin cancer Not sure of date   [facility]   Vitamin D deficiency    Past Surgical History:  Procedure Laterality Date   BREAST BIOPSY Left 02/2021   CESAREAN SECTION     x3   CHOLECYSTECTOMY     MASTECTOMY Left 03/27/2021   MASTECTOMY W/ SENTINEL NODE BIOPSY Left 03/27/2021   Procedure: LEFT MASTECTOMY WITH LEFT AXILLARY SENTINEL LYMPH NODE BIOPSY;  Surgeon: Emelia LoronWakefield, Matthew, MD;  Location: [facility] OR;  Service: General;  Laterality: Left;   TONSILLECTOMY     TUBAL LIGATION     Not sure of date   Patient Active Problem List   Diagnosis Date Noted   S/P mastectomy, left 03/27/2021   Genetic testing 03/12/2021   Family history of breast cancer 02/27/2021   Family history of melanoma 02/27/2021   Family history of glioblastoma 02/27/2021   Malignant neoplasm of upper-outer quadrant of left breast in female, estrogen receptor positive 02/22/2021   Essential hypertension, benign 01/24/2011   Hyperlipidemia 01/24/2011   Unspecified vitamin D deficiency 01/24/2011    REFERRING DIAG: left breast cancer at risk for lymphedema  THERAPY  DIAG: Aftercare following surgery for neoplasm  PERTINENT HISTORY: Suspicious area found with screening mammogram.  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  She started neoadjuvant Anastrazole, but started having some sideeffects so is to discontinue for a week. Left mastectomy with SLNB performed on  03/27/2021. Drain removed on 04/05/2021   PRECAUTIONS: left UE Lymphedema risk, None  SUBJECTIVE: Pt returns for her 3 month L-Dex screen.   PAIN:  Are you having pain? No  SOZO SCREENING: Patient was assessed today using the SOZO machine to determine the lymphedema index score. This was compared to her baseline score. It was determined that she is within the recommended range when compared to her baseline and no further action is needed at this time. She will continue SOZO screenings. These are done every 3 months for 2 years post operatively followed by every 6 months for 2 years, and then annually.   L-DEX FLOWSHEETS - 11/17/22 1000       L-DEX LYMPHEDEMA SCREENING   Measurement Type Unilateral    L-DEX MEASUREMENT EXTREMITY Upper Extremity    POSITION  Standing    DOMINANT SIDE Left    At Risk Side Left    BASELINE SCORE (UNILATERAL) -6.1    L-DEX SCORE (UNILATERAL) -2.8  VALUE CHANGE (UNILAT) 3.3              Hermenia Bers, PTA 11/17/2022, 10:49 AM

## 2022-11-24 ENCOUNTER — Ambulatory Visit: Payer: Self-pay | Admitting: *Deleted

## 2022-11-24 NOTE — Telephone Encounter (Signed)
Reason for Disposition  SEVERE itching (i.e., interferes with sleep, normal activities or school)  Answer Assessment - Initial Assessment Questions 1. APPEARANCE of RASH: "Describe the rash." (e.g., spots, blisters, raised areas, skin peeling, scaly)     Has arash on forearm, hands, bottom of face and neck and feet.  I itch all over. 2. SIZE: "How big are the spots?" (e.g., tip of pen, eraser, coin; inches, centimeters)     It looks like ezcema  3. LOCATION: "Where is the rash located?"     See above I've had this before.    I think it's from medicine.   I put Polysporin on my feet but I think I'm having a reaction to it.   It's been 4 days going on.    4. COLOR: "What color is the rash?" (Note: It is difficult to assess rash color in people with darker-colored skin. When this situation occurs, simply ask the caller to describe what they see.)     *No Answer* 5. ONSET: "When did the rash begin?"     4 days ago 6. FEVER: "Do you have a fever?" If Yes, ask: "What is your temperature, how was it measured, and when did it start?"     Not asked 7. ITCHING: "Does the rash itch?" If Yes, ask: "How bad is the itch?" (Scale 1-10; or mild, moderate, severe)     Itching real bad all over    Sometimes it burns.   Only relief is from cold compresses and Calamine helps a little. 8. CAUSE: "What do you think is causing the rash?"     Medicine Polysporin 9. MEDICINE FACTORS: "Have you started any new medicines within the last 2 weeks?" (e.g., antibiotics)      Polysporin 10. OTHER SYMPTOMS: "Do you have any other symptoms?" (e.g., dizziness, headache, sore throat, joint pain)       No 11. PREGNANCY: "Is there any chance you are pregnant?" "When was your last menstrual period?"       Not asked  Protocols used: Rash or Redness - [facility]

## 2022-11-24 NOTE — Telephone Encounter (Signed)
  Chief Complaint: Rash for the last 4 days that is spreading.  Symptoms: Itchy rash that looks like Eczema.   I think it's possibly from the Polysporin I was putting on my feet.   I have an appt. With the podiatrist on Wed. But I was hoping to been seen before then.   Frequency: For the last 4 days Pertinent Negatives: Patient denies knowing exactly what it's from. Disposition: [] ED /[] Urgent Care (no appt availability in office) / [] Appointment(In office/virtual)/ []  [place]/ [x] Home Care/ [] Refused Recommended Disposition /[] [facility]/ []  Follow-up with PCP Additional Notes: She has opted to wait until her podiatrist appt. On Wed. To see if he can do something about it.   I encouraged her to continue taking the Benadryl and using the cold compresses for the itching.

## 2022-11-26 ENCOUNTER — Ambulatory Visit (INDEPENDENT_AMBULATORY_CARE_PROVIDER_SITE_OTHER): Payer: Medicare Other | Admitting: Podiatry

## 2022-11-26 DIAGNOSIS — L239 Allergic contact dermatitis, unspecified cause: Secondary | ICD-10-CM

## 2022-11-26 MED ORDER — HYDROXYZINE PAMOATE 25 MG PO CAPS: 25.0000 mg | ORAL_CAPSULE | Freq: Three times a day (TID) | ORAL | 1 refills | Status: DC | PRN

## 2022-11-26 MED ORDER — DOXYCYCLINE HYCLATE 100 MG PO TABS: 100.0000 mg | ORAL_TABLET | Freq: Two times a day (BID) | ORAL | 0 refills | Status: DC

## 2022-11-26 NOTE — Progress Notes (Signed)
Chief Complaint  Patient presents with   Rash    Patient came in today for a rash on the left foot, started 2 weeks ago, red and swollen, burning itching patient was seen at urgent care, keflex and ciclopirox, and the the patient's rash spread to her arms,     HPI: 72 y.o. female presenting today as a new patient for evaluation of skin reaction secondary to topical steroid cream.  Patient states that she was prescribed some betamethasone ointment which created inflammation and severe pain to the left foot.  She was seen at the urgent care who prescribed Keflex and Cyclopar ox ointment which has not improved.  Patient states that she began applying Neosporin to the area but she believes that the Neosporin caused reaction to her hands as well as her forearms.  Onset was about 3 weeks ago.  She does have history of eczema.  Presenting for further treatment and evaluation  Past Medical History:  Diagnosis Date   Allergy 2016, I think   Yellow jacket sting   Breast cancer 02/2021   Eczema    Family history of breast cancer 02/27/2021   Family history of glioblastoma 02/27/2021   Family history of melanoma 02/27/2021   Hyperlipidemia    Hypertension    Obesity    Pre-diabetes    Skin cancer Not sure of date   [facility]   Vitamin D deficiency     Past Surgical History:  Procedure Laterality Date   BREAST BIOPSY Left 02/2021   CESAREAN SECTION     x3   CHOLECYSTECTOMY     MASTECTOMY Left 03/27/2021   MASTECTOMY W/ SENTINEL NODE BIOPSY Left 03/27/2021   Procedure: LEFT MASTECTOMY WITH LEFT AXILLARY SENTINEL LYMPH NODE BIOPSY;  Surgeon: Emelia Loron, MD;  Location: [facility];  Service: General;  Laterality: Left;   TONSILLECTOMY     TUBAL LIGATION     Not sure of date    Allergies  Allergen Reactions   Zocor [Simvastatin]     Leg pain   Wasp Venom Protein Itching    Blood pressure goes up   Yellow Jacket Venom [Bee Venom] Itching    Blood pressure up     Upper extremities.  11/26/2022  RT foot 11/26/2022  LT foot 11/26/2022  Physical Exam: General: The patient is alert and oriented x3 in no acute distress.  Dermatology: Inflammatory dermatitis noted, worse on the left foot encompassing the majority of the medial aspect of the left foot and ankle extending over the dorsum of the foot.  Superficial fissuring of the skin also noted along the plantar area of the foot.  Please see above noted photo  Inflammatory dermatitis also noted to a lesser extent to the right foot as well as the bilateral upper extremities/forearms  Vascular: Skin is warm to touch.  Moderate edema noted left foot  Neurological: Grossly intact via light touch  Musculoskeletal Exam: No pedal deformities noted   Assessment/Plan of Care: 1.  Inflammatory dermatitis secondary to betamethasone 0.05% topical ointment  -Patient evaluated -The patient believes that the dermatitis to the forearms is secondary to application of Neosporin that she was applying.  Discontinue -Prescription for for Atarax 25 mg TID #60 w/ 1 refill -Prescription for doxycycline 100 mg 2 times daily #20 -Patient has had good success in the past with Tinactin cream.  Resume BID to the affected areas of the feet -Return to clinic 3 weeks       Felecia Shelling,  DPM [facility]  Dr. Edrick Kins, DPM    [address], [place] [address]                Office [phone]  Fax [phone]

## 2022-12-01 ENCOUNTER — Ambulatory Visit (INDEPENDENT_AMBULATORY_CARE_PROVIDER_SITE_OTHER): Payer: Medicare Other | Admitting: Orthopedic Surgery

## 2022-12-01 ENCOUNTER — Other Ambulatory Visit (INDEPENDENT_AMBULATORY_CARE_PROVIDER_SITE_OTHER): Payer: Medicare Other

## 2022-12-01 DIAGNOSIS — M5442 Lumbago with sciatica, left side: Secondary | ICD-10-CM

## 2022-12-01 DIAGNOSIS — G8929 Other chronic pain: Secondary | ICD-10-CM

## 2022-12-01 DIAGNOSIS — M5441 Lumbago with sciatica, right side: Secondary | ICD-10-CM | POA: Diagnosis not present

## 2022-12-01 DIAGNOSIS — L309 Dermatitis, unspecified: Secondary | ICD-10-CM

## 2022-12-01 MED ORDER — PREDNISONE 10 MG PO TABS: 10.0000 mg | ORAL_TABLET | Freq: Every day | ORAL | 0 refills | Status: DC

## 2022-12-02 ENCOUNTER — Encounter: Payer: Self-pay | Admitting: Orthopedic Surgery

## 2022-12-02 NOTE — Progress Notes (Signed)
Office Visit Note   Patient: Terri Hawkins           Date of Birth: [birth year]           MRN: [id number] Visit Date: 12/01/2022              Requested by: Rema Fendt, NP [address] [place],  [place] [address] PCP: Rema Fendt, NP  Chief Complaint  Patient presents with   Lower Back - Pain      HPI: Patient is a 72 year old woman who presents with persistent lower back pain she states over the past 2 months this has been getting worse.  She states when she walks she feels a pinching sensation in her back catching and a shocking feeling.  She states the pain radiates down both legs.  Patient states she has had chronic sciatic nerve pain and has had an MRI scan that shows stenosis.  Assessment & Plan: Visit Diagnoses:  1. Chronic bilateral low back pain with bilateral sciatica     Plan: Plan: Will try prednisone 10 mg a day with breakfast.  Recommended wall or alpacas socks with antifungal powder for the fungal dermatitis of her feet.  Follow-Up Instructions: No follow-ups on file.   Ortho Exam  Patient is alert, oriented, no adenopathy, well-dressed, normal affect, normal respiratory effort. Examination patient has negative straight leg raise bilaterally no focal motor weakness.  She has dermatitis of both feet with cracked peeling skin consistent with a fungal dermatitis.  There is no cellulitis.    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  No images are attached to the encounter.  Labs: Lab Results  Component Value Date   HGBA1C 5.9 (H) 05/02/2019   HGBA1C 6.1 (H) 08/18/2013   HGBA1C 5.7 (H) 02/17/2013   REPTSTATUS 03/01/2020 FINAL 02/29/2020   CULT (A) 02/29/2020    <10,000 COLONIES/mL INSIGNIFICANT GROWTH Performed at [facility], [address] [place], [place] [address]      Lab Results  Component Value Date    ALBUMIN 4.6 11/03/2022   ALBUMIN 4.6 10/30/2021   ALBUMIN 4.0 02/27/2021    No results found for: "MG" Lab Results  Component Value Date   VD25OH 51 08/18/2013   VD25OH 35 08/16/2012   VD25OH 44 07/28/2011    No results found for: "PREALBUMIN"    Latest Ref Rng & Units 11/03/2022    9:47 AM 10/30/2021    1:54 PM 02/27/2021    8:34 AM  CBC EXTENDED  WBC 4.0 - 10.5 K/uL 11.6  7.2  7.3   RBC 3.87 - 5.11 MIL/uL 4.64  4.75  4.35   Hemoglobin 12.0 - 15.0 g/dL 82.9  56.2  13.0   HCT 36.0 - 46.0 % 41.4  42.3  39.7   Platelets 150 - 400 K/uL 268  254  247   NEUT# 1.7 - 7.7 K/uL 7.6  3.0  4.1   Lymph# 0.7 - 4.0 K/uL 3.2  3.3  2.5      There is no height or weight on file to calculate BMI.  Orders:  Orders Placed This Encounter  Procedures   XR Lumbar Spine 2-3 Views   Meds ordered this encounter  Medications   predniSONE (DELTASONE) 10 MG tablet    Sig: Take 1 tablet (10 mg total) by mouth daily with breakfast.    Dispense:  30 tablet    Refill:  0     Procedures: No procedures performed  Clinical Data: No additional findings.  ROS:  All other systems negative, except as noted in the HPI. Review of Systems  Objective: Vital Signs: There were no vitals taken for this visit.  Specialty Comments:  No specialty comments available.  PMFS History: Patient Active Problem List   Diagnosis Date Noted   S/P mastectomy, left 03/27/2021   Genetic testing 03/12/2021   Family history of breast cancer 02/27/2021   Family history of melanoma 02/27/2021   Family history of glioblastoma 02/27/2021   Malignant neoplasm of upper-outer quadrant of left breast in female, estrogen receptor positive 02/22/2021   Essential hypertension, benign 01/24/2011   Hyperlipidemia 01/24/2011   Unspecified vitamin D deficiency 01/24/2011   Past Medical History:  Diagnosis Date   Allergy 2016, I think   Yellow jacket sting   Breast cancer 02/2021   Eczema    Family history of breast  cancer 02/27/2021   Family history of glioblastoma 02/27/2021   Family history of melanoma 02/27/2021   Hyperlipidemia    Hypertension    Obesity    Pre-diabetes    Skin cancer Not sure of date   [facility]   Vitamin D deficiency     Family History  Problem Relation Age of Onset   Breast cancer Mother 10   Diabetes Father    Brain cancer Father    Cancer Father 77       glioblastoma   Breast cancer Maternal Aunt        dx after 50   Melanoma Maternal Uncle        dx after 50, arm    Past Surgical History:  Procedure Laterality Date   BREAST BIOPSY Left 02/2021   CESAREAN SECTION     x3   CHOLECYSTECTOMY     MASTECTOMY Left 03/27/2021   MASTECTOMY W/ SENTINEL NODE BIOPSY Left 03/27/2021   Procedure: LEFT MASTECTOMY WITH LEFT AXILLARY SENTINEL LYMPH NODE BIOPSY;  Surgeon: Emelia Loron, MD;  Location: [facility] OR;  Service: General;  Laterality: Left;   TONSILLECTOMY     TUBAL LIGATION     Not sure of date   Social History   Occupational History   Occupation: retire    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

## 2022-12-17 ENCOUNTER — Ambulatory Visit (INDEPENDENT_AMBULATORY_CARE_PROVIDER_SITE_OTHER): Payer: Medicare Other | Admitting: Podiatry

## 2022-12-17 DIAGNOSIS — L239 Allergic contact dermatitis, unspecified cause: Secondary | ICD-10-CM

## 2022-12-17 MED ORDER — MUPIROCIN 2 % EX OINT: 1.0000 | TOPICAL_OINTMENT | Freq: Two times a day (BID) | CUTANEOUS | 2 refills | Status: DC

## 2022-12-17 MED ORDER — TRIAMCINOLONE ACETONIDE 0.5 % EX OINT: 1.0000 | TOPICAL_OINTMENT | Freq: Two times a day (BID) | CUTANEOUS | 2 refills | Status: DC

## 2022-12-17 MED ORDER — DOXYCYCLINE HYCLATE 100 MG PO TABS: 100.0000 mg | ORAL_TABLET | Freq: Two times a day (BID) | ORAL | 0 refills | Status: DC

## 2022-12-17 NOTE — Progress Notes (Signed)
Chief Complaint  Patient presents with   Follow-up    PATIENT HERE TO GET REFILL ON MEDICATION FOR RASH LOCATED ON LEFT FOOT AND BILATERAL ARMS, MEDICATION WAS AN ANTIBIOTIC AND ANTIHISTAMINE     HPI: 72 y.o. female presenting today for follow-up evaluation of skin reaction to her bilateral forearms and left ankle.  Patient has noticed minimal improvement.  Brief history: Patient states that she was prescribed some betamethasone ointment which created inflammation and severe pain to the left foot.  She was seen at the urgent care who prescribed Keflex and Cyclopar ox ointment which has not improved.  Patient states that she began applying Neosporin to the area but she believes that the Neosporin caused reaction to her hands as well as her forearms.  She does have history of eczema.  Presenting for further treatment and evaluation  Past Medical History:  Diagnosis Date   Allergy 2016, I think   Yellow jacket sting   Breast cancer (HCC) 02/2021   Eczema    Family history of breast cancer 02/27/2021   Family history of glioblastoma 02/27/2021   Family history of melanoma 02/27/2021   Hyperlipidemia    Hypertension    Obesity    Pre-diabetes    Skin cancer Not sure of date   [facility]   Vitamin D deficiency     Past Surgical History:  Procedure Laterality Date   BREAST BIOPSY Left 02/2021   CESAREAN SECTION     x3   CHOLECYSTECTOMY     MASTECTOMY Left 03/27/2021   MASTECTOMY W/ SENTINEL NODE BIOPSY Left 03/27/2021   Procedure: LEFT MASTECTOMY WITH LEFT AXILLARY SENTINEL LYMPH NODE BIOPSY;  Surgeon: Emelia Loron, MD;  Location: [facility] OR;  Service: General;  Laterality: Left;   TONSILLECTOMY     TUBAL LIGATION     Not sure of date    Allergies  Allergen Reactions   Zocor [Simvastatin]     Leg pain   Wasp Venom Protein Itching    Blood pressure goes up   Yellow Jacket Venom [Bee Venom] Itching    Blood pressure up    Upper extremities.  11/26/2022  RT  foot 11/26/2022  LT foot 11/26/2022  Physical Exam: General: The patient is alert and oriented x3 in no acute distress.  Dermatology: Mostly unchanged.  Inflammatory dermatitis noted, worse on the left foot encompassing the majority of the medial aspect of the left foot and ankle extending over the dorsum of the foot.  Superficial fissuring of the skin also noted along the plantar area of the foot.  Please see above noted photo  Inflammatory dermatitis also noted to a lesser extent to the right foot as well as the bilateral upper extremities/forearms  Vascular: Skin is warm to touch.  Moderate edema noted left foot  Neurological: Grossly intact via light touch  Musculoskeletal Exam: No pedal deformities noted   Assessment/Plan of Care: 1.  Inflammatory dermatitis secondary to betamethasone 0.05% topical ointment  -Patient evaluated -The patient believes that the dermatitis to the forearms is secondary to application of Neosporin that she was applying.  Discontinue -Prescription for for Atarax 25 mg TID #60 w/ 1 refill - Refill prescription for doxycycline 100 mg 2 times daily #20 -Prescription for mupirocin 2% ointment apply 2 times daily - Prescription for Kenalog 0.05% ointment apply 2 times daily in combination with the mupirocin ointment -Recommend possible allergy testing from her PCP -Return to clinic as needed       Larena Glassman.  Logan Bores, DPM [facility]  Dr. Felecia Shelling, DPM    [address] [place], [place] [address]                Office [phone]  Fax [phone]

## 2022-12-28 ENCOUNTER — Other Ambulatory Visit: Payer: Self-pay | Admitting: Orthopedic Surgery

## 2022-12-30 ENCOUNTER — Ambulatory Visit (INDEPENDENT_AMBULATORY_CARE_PROVIDER_SITE_OTHER): Payer: Medicare Other | Admitting: Orthopedic Surgery

## 2022-12-30 ENCOUNTER — Encounter: Payer: Self-pay | Admitting: Orthopedic Surgery

## 2022-12-30 DIAGNOSIS — M5442 Lumbago with sciatica, left side: Secondary | ICD-10-CM | POA: Diagnosis not present

## 2022-12-30 DIAGNOSIS — G8929 Other chronic pain: Secondary | ICD-10-CM

## 2022-12-30 DIAGNOSIS — M5441 Lumbago with sciatica, right side: Secondary | ICD-10-CM

## 2022-12-30 DIAGNOSIS — L309 Dermatitis, unspecified: Secondary | ICD-10-CM

## 2022-12-30 NOTE — Progress Notes (Signed)
Office Visit Note   Patient: Terri Hawkins           Date of Birth: July 16, 1951           MRN: [id number] Visit Date: 12/30/2022              Requested by: Rema Fendt, NP [address] [place],  [place] [address] PCP: Rema Fendt, NP  Chief Complaint  Patient presents with   Lower Back - Pain      HPI: Patient is a 72 year old woman presents in follow-up for 2 separate issues.  #1 lower back pain with bilateral radicular symptoms.  Patient states that the prednisone has significantly improved her symptoms.  #2 patient had fungal dermatitis involving the left lower extremity she states this is much better as well.  Assessment & Plan: Visit Diagnoses:  1. Chronic bilateral low back pain with bilateral sciatica   2. Dermatitis of foot     Plan: Will set patient up with physical therapy upstairs force core strengthening to minimize risk of recurrent radicular pain.  Follow-Up Instructions: Return if symptoms worsen or fail to improve.   Ortho Exam  Patient is alert, oriented, no adenopathy, well-dressed, normal affect, normal respiratory effort. Examination patient has no focal motor weakness she has no radicular symptoms at this time.  Patient's fungal dermatitis on the left lower extremity is almost completely resolved.  There is some mild discoloration but no rash.  Imaging: No results found. No images are attached to the encounter.  Labs: Lab Results  Component Value Date   HGBA1C 5.9 (H) 05/02/2019   HGBA1C 6.1 (H) 08/18/2013   HGBA1C 5.7 (H) 02/17/2013   REPTSTATUS 03/01/2020 FINAL 02/29/2020   CULT (A) 02/29/2020    <10,000 COLONIES/mL INSIGNIFICANT GROWTH Performed at [facility], [address] [place], [place] [address]      Lab Results  Component Value Date   ALBUMIN 4.6 11/03/2022   ALBUMIN 4.6 10/30/2021   ALBUMIN 4.0 02/27/2021    No results found for: "MG" Lab Results  Component Value Date   VD25OH 51  08/18/2013   VD25OH 35 08/16/2012   VD25OH 44 07/28/2011    No results found for: "PREALBUMIN"    Latest Ref Rng & Units 11/03/2022    9:47 AM 10/30/2021    1:54 PM 02/27/2021    8:34 AM  CBC EXTENDED  WBC 4.0 - 10.5 K/uL 11.6  7.2  7.3   RBC 3.87 - 5.11 MIL/uL 4.64  4.75  4.35   Hemoglobin 12.0 - 15.0 g/dL 82.9  56.2  13.0   HCT 36.0 - 46.0 % 41.4  42.3  39.7   Platelets 150 - 400 K/uL 268  254  247   NEUT# 1.7 - 7.7 K/uL 7.6  3.0  4.1   Lymph# 0.7 - 4.0 K/uL 3.2  3.3  2.5      There is no height or weight on file to calculate BMI.  Orders:  No orders of the defined types were placed in this encounter.  No orders of the defined types were placed in this encounter.    Procedures: No procedures performed  Clinical Data: No additional findings.  ROS:  All other systems negative, except as noted in the HPI. Review of Systems  Objective: Vital Signs: There were no vitals taken for this visit.  Specialty Comments:  No specialty comments available.  PMFS History: Patient Active Problem List   Diagnosis Date Noted  S/P mastectomy, left 03/27/2021   Genetic testing 03/12/2021   Family history of breast cancer 02/27/2021   Family history of melanoma 02/27/2021   Family history of glioblastoma 02/27/2021   Malignant neoplasm of upper-outer quadrant of left breast in female, estrogen receptor positive (HCC) 02/22/2021   Essential hypertension, benign 01/24/2011   Hyperlipidemia 01/24/2011   Unspecified vitamin D deficiency 01/24/2011   Past Medical History:  Diagnosis Date   Allergy 2016, I think   Yellow jacket sting   Breast cancer (HCC) 02/2021   Eczema    Family history of breast cancer 02/27/2021   Family history of glioblastoma 02/27/2021   Family history of melanoma 02/27/2021   Hyperlipidemia    Hypertension    Obesity    Pre-diabetes    Skin cancer Not sure of date   [facility]   Vitamin D deficiency     Family History  Problem  Relation Age of Onset   Breast cancer Mother 35   Diabetes Father    Brain cancer Father    Cancer Father 41       glioblastoma   Breast cancer Maternal Aunt        dx after 50   Melanoma Maternal Uncle        dx after 50, arm    Past Surgical History:  Procedure Laterality Date   BREAST BIOPSY Left 02/2021   CESAREAN SECTION     x3   CHOLECYSTECTOMY     MASTECTOMY Left 03/27/2021   MASTECTOMY W/ SENTINEL NODE BIOPSY Left 03/27/2021   Procedure: LEFT MASTECTOMY WITH LEFT AXILLARY SENTINEL LYMPH NODE BIOPSY;  Surgeon: Emelia Loron, MD;  Location: [facility] OR;  Service: General;  Laterality: Left;   TONSILLECTOMY     TUBAL LIGATION     Not sure of date   Social History   Occupational History   Occupation: retire    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

## 2022-12-31 ENCOUNTER — Telehealth: Payer: Self-pay

## 2022-12-31 NOTE — Telephone Encounter (Signed)
Faxed signed orders back to [facility] for mastectomy products as per Dr. Mosetta Putt. Received fax confirmation of receipt and placed original in the to be scanned file to go into patients chart.

## 2023-01-14 ENCOUNTER — Ambulatory Visit: Payer: PRIVATE HEALTH INSURANCE | Admitting: Physical Therapy

## 2023-01-21 ENCOUNTER — Ambulatory Visit
Admission: RE | Admit: 2023-01-21 | Discharge: 2023-01-21 | Disposition: A | Discharge status: Home / Self Care | Payer: Medicare Other | Source: Ambulatory Visit | Attending: Nurse Practitioner | Admitting: Nurse Practitioner

## 2023-01-21 DIAGNOSIS — Z1231 Encounter for screening mammogram for malignant neoplasm of breast: Secondary | ICD-10-CM

## 2023-01-26 ENCOUNTER — Other Ambulatory Visit: Payer: Self-pay | Admitting: Orthopedic Surgery

## 2023-02-16 ENCOUNTER — Ambulatory Visit: Payer: Medicare Other | Attending: General Surgery

## 2023-02-16 VITALS — Wt 197.1 lb

## 2023-02-16 DIAGNOSIS — Z483 Aftercare following surgery for neoplasm: Secondary | ICD-10-CM | POA: Insufficient documentation

## 2023-02-16 NOTE — Therapy (Signed)
OUTPATIENT PHYSICAL THERAPY SOZO SCREENING NOTE   Patient Name: Terri Hawkins MRN: [id number] DOB:1950/12/06, 72 y.o., female Today's Date: 02/16/2023  PCP: Rema Fendt, NP REFERRING PROVIDER: Emelia Loron, MD   PT End of Session - 02/16/23 1049     Visit Number 6   # unchanged due to screen only   PT Start Time 1047    PT Stop Time 1051    PT Time Calculation (min) 4 min    Activity Tolerance Patient tolerated treatment well    Behavior During Therapy [facility] for tasks assessed/performed             Past Medical History:  Diagnosis Date   Allergy 2016, I think   Yellow jacket sting   Breast cancer (HCC) 02/2021   Eczema    Family history of breast cancer 02/27/2021   Family history of glioblastoma 02/27/2021   Family history of melanoma 02/27/2021   Hyperlipidemia    Hypertension    Obesity    Pre-diabetes    Skin cancer Not sure of date   [facility]   Vitamin D deficiency    Past Surgical History:  Procedure Laterality Date   BREAST BIOPSY Left 02/2021   CESAREAN SECTION     x3   CHOLECYSTECTOMY     MASTECTOMY Left 03/27/2021   MASTECTOMY W/ SENTINEL NODE BIOPSY Left 03/27/2021   Procedure: LEFT MASTECTOMY WITH LEFT AXILLARY SENTINEL LYMPH NODE BIOPSY;  Surgeon: Emelia Loron, MD;  Location: [facility] OR;  Service: General;  Laterality: Left;   TONSILLECTOMY     TUBAL LIGATION     Not sure of date   Patient Active Problem List   Diagnosis Date Noted   S/P mastectomy, left 03/27/2021   Genetic testing 03/12/2021   Family history of breast cancer 02/27/2021   Family history of melanoma 02/27/2021   Family history of glioblastoma 02/27/2021   Malignant neoplasm of upper-outer quadrant of left breast in female, estrogen receptor positive (HCC) 02/22/2021   Essential hypertension, benign 01/24/2011   Hyperlipidemia 01/24/2011   Unspecified vitamin D deficiency 01/24/2011    REFERRING DIAG: left breast cancer at risk for  lymphedema  THERAPY DIAG: Aftercare following surgery for neoplasm  PERTINENT HISTORY: Suspicious area found with screening mammogram.  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  She started neoadjuvant Anastrazole, but started having some sideeffects so is to discontinue for a week. Left mastectomy with SLNB performed on  03/27/2021. Drain removed on 04/05/2021   PRECAUTIONS: left UE Lymphedema risk, None  SUBJECTIVE: Pt returns for her 3 month L-Dex screen.   PAIN:  Are you having pain? No  SOZO SCREENING: Patient was assessed today using the SOZO machine to determine the lymphedema index score. This was compared to her baseline score. It was determined that she is within the recommended range when compared to her baseline and no further action is needed at this time. She will continue SOZO screenings. These are done every 3 months for 2 years post operatively followed by every 6 months for 2 years, and then annually.   L-DEX FLOWSHEETS - 02/16/23 1000       L-DEX LYMPHEDEMA SCREENING   Measurement Type Unilateral    L-DEX MEASUREMENT EXTREMITY Upper Extremity    POSITION  Standing    DOMINANT SIDE Left    At Risk Side Left    BASELINE SCORE (UNILATERAL) -6.1    L-DEX SCORE (UNILATERAL) -5  VALUE CHANGE (UNILAT) 1.1            P: Begin 6 month L-Dex screens next.   Hermenia Bers, PTA 02/16/2023, 10:51 AM

## 2023-02-23 ENCOUNTER — Other Ambulatory Visit: Payer: Self-pay | Admitting: Orthopedic Surgery

## 2023-03-29 ENCOUNTER — Other Ambulatory Visit: Payer: Self-pay | Admitting: Orthopedic Surgery

## 2023-04-29 ENCOUNTER — Other Ambulatory Visit: Payer: Self-pay | Admitting: Family

## 2023-05-30 ENCOUNTER — Other Ambulatory Visit: Payer: Self-pay | Admitting: Family

## 2023-06-01 ENCOUNTER — Encounter (HOSPITAL_COMMUNITY): Payer: Self-pay

## 2023-06-01 ENCOUNTER — Ambulatory Visit (HOSPITAL_COMMUNITY)
Admission: EM | Admit: 2023-06-01 | Discharge: 2023-06-01 | Disposition: A | Discharge status: Home / Self Care | Payer: Medicare Other | Attending: Internal Medicine | Admitting: Internal Medicine

## 2023-06-01 DIAGNOSIS — H6993 Unspecified Eustachian tube disorder, bilateral: Secondary | ICD-10-CM

## 2023-06-01 DIAGNOSIS — J309 Allergic rhinitis, unspecified: Secondary | ICD-10-CM | POA: Diagnosis not present

## 2023-06-01 MED ORDER — LORATADINE 10 MG PO TABS: 10.0000 mg | ORAL_TABLET | Freq: Every day | ORAL | 0 refills | Status: DC

## 2023-06-01 MED ORDER — FLUTICASONE PROPIONATE 50 MCG/ACT NA SUSP: 1.0000 | Freq: Every day | NASAL | 0 refills | Status: DC

## 2023-06-01 NOTE — Discharge Instructions (Addendum)
Take Claritin and Flonase daily to dry up fluid behind eardrums and runny nose. Follow-up with PCP as needed.

## 2023-06-01 NOTE — ED Provider Notes (Signed)
[facility]    CSN: [id number] Arrival date & time: 06/01/23  1400      History   Chief Complaint Chief Complaint  Patient presents with   Ear Fullness    HPI Terri Hawkins is a 72 y.o. female.   Patient presents to urgent care for evaluation of bilateral ear fullness and rhinorrhea that started a few days ago after she did yard work outside. Ears do not hurt, however they feel very full and as if something is inside the ear canal. Denies tinnitus, dizziness, cough, sore throat, headaches, watery/itchy eyes, and neck pain. No fevers or chills. Denies history of seasonal allergies. She had some leftover prednisone 10mg  pills from previous prescription for her back and took this this morning without relief. Otherwise, no attempted use of any OTC medicines.   Ear Fullness    Past Medical History:  Diagnosis Date   Allergy 2016, I think   Yellow jacket sting   Breast cancer (HCC) 02/2021   Eczema    Family history of breast cancer 02/27/2021   Family history of glioblastoma 02/27/2021   Family history of melanoma 02/27/2021   Hyperlipidemia    Hypertension    Obesity    Pre-diabetes    Skin cancer Not sure of date   [facility]   Vitamin D deficiency     Patient Active Problem List   Diagnosis Date Noted   S/P mastectomy, left 03/27/2021   Genetic testing 03/12/2021   Family history of breast cancer 02/27/2021   Family history of melanoma 02/27/2021   Family history of glioblastoma 02/27/2021   Malignant neoplasm of upper-outer quadrant of left breast in female, estrogen receptor positive (HCC) 02/22/2021   Essential hypertension, benign 01/24/2011   Hyperlipidemia 01/24/2011   Vitamin D deficiency 01/24/2011    Past Surgical History:  Procedure Laterality Date   BREAST BIOPSY Left 02/2021   CESAREAN SECTION     x3   CHOLECYSTECTOMY     MASTECTOMY Left 03/27/2021   MASTECTOMY W/ SENTINEL NODE BIOPSY Left 03/27/2021   Procedure:  LEFT MASTECTOMY WITH LEFT AXILLARY SENTINEL LYMPH NODE BIOPSY;  Surgeon: Emelia Loron, MD;  Location: [facility] OR;  Service: General;  Laterality: Left;   TONSILLECTOMY     TUBAL LIGATION     Not sure of date    OB History   No obstetric history on file.      Home Medications    Prior to Admission medications   Medication Sig Start Date End Date Taking? Authorizing Provider  fluticasone (FLONASE) 50 MCG/ACT nasal spray Place 1 spray into both nostrils daily. 06/01/23  Yes Carlisle Beers, FNP  loratadine (CLARITIN) 10 MG tablet Take 1 tablet (10 mg total) by mouth daily. 06/01/23  Yes Carlisle Beers, FNP  acetaminophen (TYLENOL) 500 MG tablet Take 500 mg by mouth every 6 (six) hours as needed for moderate pain or headache.    [provider]  Cholecalciferol 25 MCG (1000 UT) CHEW Chew 1,000 Units by mouth daily.    [provider]  hydrOXYzine (VISTARIL) 25 MG capsule Take 1 capsule (25 mg total) by mouth every 8 (eight) hours as needed. 11/26/22   Felecia Shelling, DPM  melatonin 3 MG TABS tablet Take 6 mg by mouth at bedtime as needed (sleep).    [provider]  Multiple Vitamin (MULTIVITAMIN) capsule Take 1 capsule by mouth daily.    [provider]  predniSONE (DELTASONE) 10 MG tablet TAKE 1 TABLET (  10 MG TOTAL) BY MOUTH DAILY WITH BREAKFAST. 06/01/23   Nadara Mustard, MD  triamcinolone ointment (KENALOG) 0.5 % Apply 1 Application topically 2 (two) times daily. 12/17/22   Felecia Shelling, DPM    Family History Family History  Problem Relation Age of Onset   Breast cancer Mother 21   Diabetes Father    Brain cancer Father    Cancer Father 2       glioblastoma   Breast cancer Maternal Aunt        dx after 50   Melanoma Maternal Uncle        dx after 50, arm    Social History Social History   Tobacco Use   Smoking status: Never   Smokeless tobacco: Never  Vaping Use   Vaping status: Never Used  Substance Use Topics    Alcohol use: Yes    Alcohol/week: 1.0 standard drink of alcohol    Types: 1 Glasses of wine per week    Comment: Some weeks 0 wine   Drug use: No     Allergies   Zocor [simvastatin], Wasp venom protein, and Yellow jacket venom [bee venom]   Review of Systems Review of Systems Per HPI  Physical Exam Triage Vital Signs ED Triage Vitals [06/01/23 1520]  Encounter Vitals Group     BP (!) 159/87     Systolic BP Percentile      Diastolic BP Percentile      Pulse Rate 82     Resp 16     Temp 98.5 F (36.9 C)     Temp Source Oral     SpO2 97 %     Weight 185 lb (83.9 kg)     Height 5\' 4"  (1.626 m)     Head Circumference      Peak Flow      Pain Score 0     Pain Loc      Pain Education      Exclude from Growth Chart    No data found.  Updated Vital Signs BP (!) 159/87 (BP Location: Right Arm)   Pulse 82   Temp 98.5 F (36.9 C) (Oral)   Resp 16   Ht 5\' 4"  (1.626 m)   Wt 185 lb (83.9 kg)   SpO2 97%   BMI 31.76 kg/m   Visual Acuity Right Eye Distance:   Left Eye Distance:   Bilateral Distance:    Right Eye Near:   Left Eye Near:    Bilateral Near:     Physical Exam Vitals and nursing note reviewed.  Constitutional:      Appearance: She is not ill-appearing or toxic-appearing.  HENT:     Head: Normocephalic and atraumatic.     Right Ear: Hearing, ear canal and external ear normal. No decreased hearing noted. No drainage, swelling or tenderness. A middle ear effusion is present. Tympanic membrane is not perforated, erythematous, retracted or bulging.     Left Ear: Hearing, ear canal and external ear normal. No decreased hearing noted. No drainage, swelling or tenderness. A middle ear effusion is present. Tympanic membrane is not perforated, erythematous, retracted or bulging.     Nose: Nose normal.     Mouth/Throat:     Lips: Pink.     Mouth: Mucous membranes are moist. No injury.     Tongue: No lesions. Tongue does not deviate from midline.     Palate: No  mass and lesions.     Pharynx: Oropharynx  is clear. Uvula midline. No pharyngeal swelling, oropharyngeal exudate, posterior oropharyngeal erythema or uvula swelling.     Tonsils: No tonsillar exudate or tonsillar abscesses.  Eyes:     General: Lids are normal. Vision grossly intact. Gaze aligned appropriately.     Extraocular Movements: Extraocular movements intact.     Conjunctiva/sclera: Conjunctivae normal.  Cardiovascular:     Rate and Rhythm: Normal rate and regular rhythm.     Heart sounds: Normal heart sounds, S1 normal and S2 normal.  Pulmonary:     Effort: Pulmonary effort is normal. No respiratory distress.     Breath sounds: Normal breath sounds and air entry.  Musculoskeletal:     Cervical back: Neck supple.  Lymphadenopathy:     Cervical: No cervical adenopathy.  Skin:    General: Skin is warm and dry.     Capillary Refill: Capillary refill takes less than 2 seconds.     Findings: No rash.  Neurological:     General: No focal deficit present.     Mental Status: She is alert and oriented to person, place, and time. Mental status is at baseline.     Cranial Nerves: No dysarthria or facial asymmetry.  Psychiatric:        Mood and Affect: Mood normal.        Speech: Speech normal.        Behavior: Behavior normal.        Thought Content: Thought content normal.        Judgment: Judgment normal.      UC Treatments / Results  Labs (all labs ordered are listed, but only abnormal results are displayed) Labs Reviewed - No data to display  EKG   Radiology No results found.  Procedures Procedures (including critical care time)  Medications Ordered in UC Medications - No data to display  Initial Impression / Assessment and Plan / UC Course  I have reviewed the triage vital signs and the nursing notes.  Pertinent labs & imaging results that were available during my care of the patient were reviewed by me and considered in my medical decision making (see chart  for details).   1.  Allergic rhinitis, eustachian tube dysfunction bilaterally Presentation suspicious for eustachian tube dysfunction bilaterally.  Will treat this with Claritin and Flonase daily.  No signs of otitis media or otitis externa.  May follow-up with PCP as needed.  Counseled patient on potential for adverse effects with medications prescribed/recommended today, strict ER and return-to-clinic precautions discussed, patient verbalized understanding.    Final Clinical Impressions(s) / UC Diagnoses   Final diagnoses:  Allergic rhinitis, unspecified seasonality, unspecified trigger  Eustachian tube dysfunction, bilateral     Discharge Instructions      Take Claritin and Flonase daily to dry up fluid behind eardrums and runny nose. Follow-up with PCP as needed.     ED Prescriptions     Medication Sig Dispense Auth. Provider   loratadine (CLARITIN) 10 MG tablet Take 1 tablet (10 mg total) by mouth daily. 30 tablet Reita May M, FNP   fluticasone [facility]) 50 MCG/ACT nasal spray Place 1 spray into both nostrils daily. 16 g Carlisle Beers, FNP      PDMP not reviewed this encounter.   Carlisle Beers, [place] 06/01/23 1657

## 2023-06-01 NOTE — ED Triage Notes (Signed)
Patient here today with c/o right ear fullness X 1 day. Patient states that her left ear feels a little full but the right is worse. Denies any pain.

## 2023-06-28 ENCOUNTER — Other Ambulatory Visit: Payer: Self-pay | Admitting: Orthopedic Surgery

## 2023-07-02 ENCOUNTER — Encounter: Payer: Self-pay | Admitting: Orthopedic Surgery

## 2023-07-02 ENCOUNTER — Ambulatory Visit (INDEPENDENT_AMBULATORY_CARE_PROVIDER_SITE_OTHER): Payer: Medicare Other | Admitting: Orthopedic Surgery

## 2023-07-02 VITALS — BP 144/78 | HR 98 | Temp 97.1°F | Resp 17 | Ht 64.0 in | Wt 204.6 lb

## 2023-07-02 DIAGNOSIS — B001 Herpesviral vesicular dermatitis: Secondary | ICD-10-CM | POA: Diagnosis not present

## 2023-07-02 DIAGNOSIS — C50412 Malignant neoplasm of upper-outer quadrant of left female breast: Secondary | ICD-10-CM

## 2023-07-02 DIAGNOSIS — I10 Essential (primary) hypertension: Secondary | ICD-10-CM | POA: Diagnosis not present

## 2023-07-02 DIAGNOSIS — M545 Low back pain, unspecified: Secondary | ICD-10-CM | POA: Diagnosis not present

## 2023-07-02 DIAGNOSIS — M8588 Other specified disorders of bone density and structure, other site: Secondary | ICD-10-CM

## 2023-07-02 DIAGNOSIS — E66812 Obesity, class 2: Secondary | ICD-10-CM

## 2023-07-02 DIAGNOSIS — G8929 Other chronic pain: Secondary | ICD-10-CM

## 2023-07-02 DIAGNOSIS — E7800 Pure hypercholesterolemia, unspecified: Secondary | ICD-10-CM

## 2023-07-02 DIAGNOSIS — Z170 Estrogen receptor positive status [ER+]: Secondary | ICD-10-CM

## 2023-07-02 DIAGNOSIS — Z1211 Encounter for screening for malignant neoplasm of colon: Secondary | ICD-10-CM

## 2023-07-02 MED ORDER — VALACYCLOVIR HCL 500 MG PO TABS
1000.0000 mg | ORAL_TABLET | Freq: Every day | ORAL | 0 refills | Status: AC | PRN
Start: 2023-07-02 — End: ?

## 2023-07-02 NOTE — Patient Instructions (Addendum)
Tylenol 1000 mg every morning for chronic back pain  [place] back patches> cheat at [employer]> apply 1 patch where it hurts daily  Caltrate- for bone health, you may purchase over the counter, please take with vitamin D  Recommend light walking at least 177min/week- break it up into 30 minute sessions  Avoid prolonged sitting

## 2023-07-02 NOTE — Progress Notes (Signed)
Careteam: Patient Care Team: Rema Fendt, NP as PCP - General (Nurse Practitioner) Pershing Proud, RN as Oncology Nurse Navigator Donnelly Angelica, RN as Oncology Nurse Navigator Malachy Mood, MD as Consulting Physician (Hematology) Emelia Loron, MD as Consulting Physician (General Surgery) Antony Blackbird, MD as Consulting Physician (Radiation Oncology) Pollyann Samples, NP as Nurse Practitioner (Nurse Practitioner)  Seen by: Hazle Nordmann, [name]  PLACE OF SERVICE:  [facility]  Advanced Directive information Does Patient Have a Medical Advance Directive?: Yes, Type of Advance Directive: Living will;Out of facility DNR (pink MOST or yellow form), Does patient want to make changes to medical advance directive?: No - Patient declined  Allergies  Allergen Reactions   Neomycin Rash   Zocor [Simvastatin]     Leg pain   Wasp Venom Protein Itching    Blood pressure goes up   Yellow Jacket Venom [Bee Venom] Itching    Blood pressure up    Chief Complaint  Patient presents with   Establish Care    New patient.      HPI: Patient is a 72 y.o. female seen today to establish at [facility].   Ricky Stabs NP was previous provider. Lived in [place] her whole life. Divorced, lives with partner. 2 children. Retired at age 61. 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   Denies MI or T2DM.   HTN- began after age 53, elevated today, she took amlodipine 2-3 years ago, not taking medication, denies chest pain/sob/headaches/blurred vision  Breast cancer- followed by Dr. Mosetta Putt, Left mastectomy 2022, no chemo or radiation, she was unable to tolerate anastrozole> menopause symptoms, oncology visits every 6 months   Low back pain- began 11/2022, followed by Dr. Lajoyce Corners, she had shooting pain radiating to lower legs, pain was worse in AM, she was placed on prednisone for awhile  No recent hospitalizations.   Past surgeries:  Left mastectomy 2022  Cholecystectomy 1988  C-  section 1979, 1982  Past procedures:  Mammogram- 01/2023  Bone Density- 08/2021> osteopenia  Colonoscopy done at age 48, denies having repeat study  Family history:  Father passed at age 10> brain tumor(glioblastoma), T2DM  Mother passed at age 22 from breast cancer  2 brothers> does not know health history  Never smoked or used tobacco products.  Drinks 2-3 glasses of wine per week.  No illicit drug use.   Eats 2-3 meals daily. Does not follow particular diet. Drinks a diet ginger ale daily. 2 cups coffee daily.  Does not follow exercise regimen but does own chores at home.   She has living will, no HPOA.   Had bad interaction when she had flu and covid vaccines together last year. Not interested in future covid vaccines.   Review of Systems:  Review of Systems  Constitutional: Negative.   HENT: Negative.    Eyes:        Eyeglasses  Respiratory: Negative.    Cardiovascular: Negative.   Gastrointestinal: Negative.   Genitourinary: Negative.   Musculoskeletal: Negative.   Skin: Negative.   Neurological: Negative.   Psychiatric/Behavioral: Negative.      Past Medical History:  Diagnosis Date   Allergy 2016, I think   Yellow jacket sting   Breast cancer (HCC) 02/2021   Eczema    Family history of breast cancer 02/27/2021   Family history of glioblastoma 02/27/2021   Family history of melanoma 02/27/2021   Hyperlipidemia    Hypertension    Obesity    Pre-diabetes  Skin cancer Not sure of date   [facility]   Vitamin D deficiency    Past Surgical History:  Procedure Laterality Date   BREAST BIOPSY Left 02/2021   CESAREAN SECTION     x3   CHOLECYSTECTOMY     MASTECTOMY Left 03/27/2021   MASTECTOMY W/ SENTINEL NODE BIOPSY Left 03/27/2021   Procedure: LEFT MASTECTOMY WITH LEFT AXILLARY SENTINEL LYMPH NODE BIOPSY;  Surgeon: Emelia Loron, MD;  Location: [facility];  Service: General;  Laterality: Left;   TONSILLECTOMY     TUBAL LIGATION     Not  sure of date   Social History:   reports that she has never smoked. She has never used smokeless tobacco. She reports current alcohol use of about 1.0 standard drink of alcohol per week. She reports that she does not use drugs.  Family History  Problem Relation Age of Onset   Breast cancer Mother 57   Diabetes Father    Brain cancer Father    Cancer Father 37       glioblastoma   Breast cancer Maternal Aunt        dx after 50   Melanoma Maternal Uncle        dx after 50, arm    Medications: Patient's Medications  New Prescriptions   No medications on file  Previous Medications   ACETAMINOPHEN (TYLENOL) 500 MG TABLET    Take 500 mg by mouth every 6 (six) hours as needed for moderate pain or headache.   CHOLECALCIFEROL 25 MCG (1000 UT) CHEW    Chew 1,000 Units by mouth daily.   HYDROXYZINE (VISTARIL) 25 MG CAPSULE    Take 1 capsule (25 mg total) by mouth every 8 (eight) hours as needed.   MELATONIN 3 MG TABS TABLET    Take 6 mg by mouth at bedtime as needed (sleep).   MULTIPLE VITAMIN (MULTIVITAMIN) CAPSULE    Take 1 capsule by mouth daily.   PREDNISONE (DELTASONE) 10 MG TABLET    Take 10 mg by mouth as needed.  Modified Medications   No medications on file  Discontinued Medications   FLUTICASONE (FLONASE) 50 MCG/ACT NASAL SPRAY    Place 1 spray into both nostrils daily.   LORATADINE (CLARITIN) 10 MG TABLET    Take 1 tablet (10 mg total) by mouth daily.   PREDNISONE (DELTASONE) 10 MG TABLET    TAKE 1 TABLET (10 MG TOTAL) BY MOUTH DAILY WITH BREAKFAST.   TRIAMCINOLONE OINTMENT (KENALOG) 0.5 %    Apply 1 Application topically 2 (two) times daily.    Physical Exam:  Vitals:   07/02/23 1017  BP: (!) 150/88  Pulse: 98  Resp: 17  Temp: (!) 97.1 F (36.2 C)  SpO2: 90%  Weight: 204 lb 9.6 oz (92.8 kg)  Height: 5\' 4"  (1.626 m)   Body mass index is 35.12 kg/m. Wt Readings from Last 3 Encounters:  07/02/23 204 lb 9.6 oz (92.8 kg)  06/01/23 185 lb (83.9 kg)  02/16/23 197 lb  2 oz (89.4 kg)    Physical Exam Vitals reviewed.  Constitutional:      General: She is not in acute distress. HENT:     Head: Normocephalic.  Eyes:     General:        Right eye: No discharge.        Left eye: No discharge.  Cardiovascular:     Rate and Rhythm: Normal rate and regular rhythm.     Pulses: Normal pulses.  Heart sounds: Normal heart sounds.  Pulmonary:     Effort: Pulmonary effort is normal.     Breath sounds: Normal breath sounds.  Abdominal:     General: Bowel sounds are normal.     Palpations: Abdomen is soft.  Musculoskeletal:     Cervical back: Neck supple.     Right lower leg: No edema.     Left lower leg: No edema.  Skin:    General: Skin is warm.     Capillary Refill: Capillary refill takes less than 2 seconds.  Neurological:     General: No focal deficit present.     Mental Status: She is alert and oriented to person, place, and time.  Psychiatric:        Mood and Affect: Mood normal.     Labs reviewed: Basic Metabolic Panel: Recent Labs    11/03/22 0947  NA 138  K 4.4  CL 103  CO2 28  GLUCOSE 111*  BUN 18  CREATININE 0.87  CALCIUM 10.1   Liver Function Tests: Recent Labs    11/03/22 0947  AST 16  ALT 17  ALKPHOS 81  BILITOT 0.7  PROT 7.4  ALBUMIN 4.6   No results for input(s): "LIPASE", "AMYLASE" in the last 8760 hours. No results for input(s): "AMMONIA" in the last 8760 hours. CBC: Recent Labs    11/03/22 0947  WBC 11.6*  NEUTROABS 7.6  HGB 14.0  HCT 41.4  MCV 89.2  PLT 268   Lipid Panel: No results for input(s): "CHOL", "HDL", "LDLCALC", "TRIG", "CHOLHDL", "LDLDIRECT" in the last 8760 hours. TSH: No results for input(s): "TSH" in the last 8760 hours. A1C: Lab Results  Component Value Date   HGBA1C 5.9 (H) 05/02/2019     Assessment/Plan 1. Essential hypertension, benign - uncontrolled - was on amlodipine in past - EKG 2022-  - plan for blood pressures 2x/day x 14 days - recommend restarting  amlodipine if SBP> 150  2. Chronic midline low back pain without sciatica - improved - followed by Dr. Lajoyce Corners - off prednisone - recommend light walking a few times weekly - avoid heavy lifting or strenuous exercise  3. Fever blister - valACYclovir (VALTREX) 500 MG tablet; Take 2 tablets (1,000 mg total) by mouth daily as needed.  Dispense: 30 tablet; Refill: 0  4. Malignant neoplasm of upper-outer quadrant of left breast in female, estrogen receptor positive (HCC) - s/p left mastectomy 2022 - followed by Dr. Mosetta Putt - unable to tolerate anastrozole  5. Osteopenia of spine - DEXA 08/2021, t score -1.8 - only taking vitamin D - recommend starting Caltrate supplement - discussed weight bearing exercise like walking  6. Obesity, Class II, BMI 35-39.9 - BMI 35.12  7. Colon cancer screening - referral to [facility]  8. Pure hypercholesterolemia - Total 268, LDL 185 04/2019 - not on statin   Future labs/tests: cbc/diff, cmp, lipid panel  Total time: 51 minutes. Greater than 50% of total time spent doing patient education regarding health maintenance, HTN, HLD, back pain, osteopenia including symptom/medication management.    Next appt: 07/30/2023  Hazle Nordmann, Juel Burrow  [facility] [phone]

## 2023-07-10 ENCOUNTER — Other Ambulatory Visit: Payer: Self-pay | Admitting: Orthopedic Surgery

## 2023-07-10 DIAGNOSIS — B001 Herpesviral vesicular dermatitis: Secondary | ICD-10-CM

## 2023-07-13 NOTE — Telephone Encounter (Signed)
Patient is requesting 90 day prescription. Medication pend and sent to PCP Octavia Heir, NP for approval.

## 2023-07-28 ENCOUNTER — Other Ambulatory Visit: Payer: Medicare Other

## 2023-07-28 DIAGNOSIS — I10 Essential (primary) hypertension: Secondary | ICD-10-CM

## 2023-07-29 LAB — COMPLETE METABOLIC PANEL WITH GFR
AG Ratio: 1.8 (calc) (ref 1.0–2.5)
ALT: 18 U/L (ref 6–29)
AST: 18 U/L (ref 10–35)
Albumin: 4.4 g/dL (ref 3.6–5.1)
Alkaline phosphatase (APISO): 78 U/L (ref 37–153)
BUN: 24 mg/dL (ref 7–25)
CO2: 27 mmol/L (ref 20–32)
Calcium: 9.8 mg/dL (ref 8.6–10.4)
Chloride: 101 mmol/L (ref 98–110)
Creat: 0.82 mg/dL (ref 0.60–1.00)
Globulin: 2.4 g/dL (ref 1.9–3.7)
Glucose, Bld: 100 mg/dL — ABNORMAL HIGH (ref 65–99)
Potassium: 4.6 mmol/L (ref 3.5–5.3)
Sodium: 137 mmol/L (ref 135–146)
Total Bilirubin: 0.8 mg/dL (ref 0.2–1.2)
Total Protein: 6.8 g/dL (ref 6.1–8.1)
eGFR: 76 mL/min/{1.73_m2} (ref >=60)

## 2023-07-29 LAB — CBC WITH DIFFERENTIAL/PLATELET
Absolute Lymphocytes: 3222 {cells}/uL (ref 850–3900)
Absolute Monocytes: 638 {cells}/uL (ref 200–950)
Basophils Absolute: 38 {cells}/uL (ref 0–200)
Basophils Relative: 0.5 %
Eosinophils Absolute: 152 {cells}/uL (ref 15–500)
Eosinophils Relative: 2 %
HCT: 43.9 % (ref 35.0–45.0)
Hemoglobin: 14.5 g/dL (ref 11.7–15.5)
MCH: 30 pg (ref 27.0–33.0)
MCHC: 33 g/dL (ref 32.0–36.0)
MCV: 90.7 fL (ref 80.0–100.0)
MPV: 10.6 fL (ref 7.5–12.5)
Monocytes Relative: 8.4 %
Neutro Abs: 3549 {cells}/uL (ref 1500–7800)
Neutrophils Relative %: 46.7 %
Platelets: 296 10*3/uL (ref 140–400)
RBC: 4.84 10*6/uL (ref 3.80–5.10)
RDW: 12.1 % (ref 11.0–15.0)
Total Lymphocyte: 42.4 %
WBC: 7.6 10*3/uL (ref 3.8–10.8)

## 2023-07-30 ENCOUNTER — Encounter: Payer: Self-pay | Admitting: Orthopedic Surgery

## 2023-07-30 ENCOUNTER — Ambulatory Visit (INDEPENDENT_AMBULATORY_CARE_PROVIDER_SITE_OTHER): Payer: Medicare Other | Admitting: Orthopedic Surgery

## 2023-07-30 VITALS — BP 156/95 | HR 75 | Temp 96.7°F | Resp 17 | Ht 64.0 in | Wt 198.4 lb

## 2023-07-30 DIAGNOSIS — Z23 Encounter for immunization: Secondary | ICD-10-CM

## 2023-07-30 DIAGNOSIS — Z1211 Encounter for screening for malignant neoplasm of colon: Secondary | ICD-10-CM | POA: Diagnosis not present

## 2023-07-30 DIAGNOSIS — I10 Essential (primary) hypertension: Secondary | ICD-10-CM | POA: Diagnosis not present

## 2023-07-30 MED ORDER — AMLODIPINE BESYLATE 5 MG PO TABS
5.0000 mg | ORAL_TABLET | Freq: Every day | ORAL | 1 refills | Status: AC
Start: 2023-07-30 — End: ?

## 2023-07-30 NOTE — Progress Notes (Signed)
Careteam: Patient Care Team: Octavia Heir, NP as PCP - General (Adult Health Nurse Practitioner) Pershing Proud, RN as Oncology Nurse Navigator Donnelly Angelica, RN as Oncology Nurse Navigator Malachy Mood, MD as Consulting Physician (Hematology) Emelia Loron, MD as Consulting Physician (General Surgery) Antony Blackbird, MD as Consulting Physician (Radiation Oncology) Pollyann Samples, NP as Nurse Practitioner (Nurse Practitioner)  Seen by: Hazle Nordmann, [name]  PLACE OF SERVICE:  [facility]  Advanced Directive information Does Patient Have a Medical Advance Directive?: Yes, Type of Advance Directive: Living will;Out of facility DNR (pink MOST or yellow form), Does patient want to make changes to medical advance directive?: No - Patient declined  Allergies  Allergen Reactions   Neomycin Rash   Zocor [Simvastatin]     Leg pain   Wasp Venom Protein Itching    Blood pressure goes up   Yellow Jacket Venom [Bee Venom] Itching    Blood pressure up    Chief Complaint  Patient presents with   Follow-up    New patient 4 week follow up.      HPI: Patient is a 72 y.o. female seen today for medical management of chronic conditions.   No health concerns.   Home blood pressure readings > 150/90. She was on amlodipine 5 mg in past but stopped years ago. Following low sodium diet. Denies chest pain, sob, blurred vision and HA.   Prevar 20 given today.   She watches grandchildren often, discussed RSV vaccine.   Last colonoscopy at age 36. She agrees to have referral placed for another study.    Review of Systems:  Review of Systems  Constitutional: Negative.   HENT: Negative.    Eyes: Negative.   Respiratory: Negative.    Cardiovascular: Negative.   Gastrointestinal: Negative.   Genitourinary: Negative.   Musculoskeletal: Negative.   Skin: Negative.   Neurological: Negative.   Psychiatric/Behavioral: Negative.      Past Medical History:  Diagnosis Date   Allergy  2016, I think   Yellow jacket sting   Breast cancer (HCC) 02/2021   Eczema    Family history of breast cancer 02/27/2021   Family history of glioblastoma 02/27/2021   Family history of melanoma 02/27/2021   Hyperlipidemia    Hypertension    Obesity    Pre-diabetes    Skin cancer Not sure of date   [facility]   Vitamin D deficiency    Past Surgical History:  Procedure Laterality Date   BREAST BIOPSY Left 02/2021   CESAREAN SECTION     x3   CHOLECYSTECTOMY     MASTECTOMY Left 03/27/2021   MASTECTOMY W/ SENTINEL NODE BIOPSY Left 03/27/2021   Procedure: LEFT MASTECTOMY WITH LEFT AXILLARY SENTINEL LYMPH NODE BIOPSY;  Surgeon: Emelia Loron, MD;  Location: [facility] OR;  Service: General;  Laterality: Left;   TONSILLECTOMY     TUBAL LIGATION     Not sure of date   Social History:   reports that she has never smoked. She has never used smokeless tobacco. She reports current alcohol use of about 1.0 standard drink of alcohol per week. She reports that she does not use drugs.  Family History  Problem Relation Age of Onset   Breast cancer Mother 2   Diabetes Father    Brain cancer Father    Cancer Father 73       glioblastoma   Breast cancer Maternal Aunt        dx after 91  Melanoma Maternal Uncle        dx after 50, arm    Medications: Patient's Medications  New Prescriptions   No medications on file  Previous Medications   ACETAMINOPHEN (TYLENOL) 500 MG TABLET    Take 500 mg by mouth every 6 (six) hours as needed for moderate pain or headache.   CHOLECALCIFEROL 25 MCG (1000 UT) CHEW    Chew 1,000 Units by mouth daily.   HYDROXYZINE (VISTARIL) 25 MG CAPSULE    Take 1 capsule (25 mg total) by mouth every 8 (eight) hours as needed.   MELATONIN 3 MG TABS TABLET    Take 6 mg by mouth at bedtime as needed (sleep).   MULTIPLE VITAMIN (MULTIVITAMIN) CAPSULE    Take 1 capsule by mouth daily.   PREDNISONE (DELTASONE) 10 MG TABLET    Take 10 mg by mouth as needed.    VALACYCLOVIR (VALTREX) 500 MG TABLET    Take 2 tablets (1,000 mg total) by mouth daily as needed.  Modified Medications   No medications on file  Discontinued Medications   No medications on file    Physical Exam:  Vitals:   07/30/23 1339  BP: (!) 160/86  Pulse: 75  Resp: 17  Temp: (!) 96.7 F (35.9 C)  SpO2: 97%  Weight: 198 lb 6.4 oz (90 kg)  Height: 5\' 4"  (1.626 m)   Body mass index is 34.06 kg/m. Wt Readings from Last 3 Encounters:  07/30/23 198 lb 6.4 oz (90 kg)  07/02/23 204 lb 9.6 oz (92.8 kg)  06/01/23 185 lb (83.9 kg)    Physical Exam Vitals reviewed.  Constitutional:      General: She is not in acute distress. HENT:     Head: Normocephalic.  Eyes:     General:        Right eye: No discharge.        Left eye: No discharge.  Cardiovascular:     Rate and Rhythm: Normal rate and regular rhythm.     Pulses: Normal pulses.     Heart sounds: Normal heart sounds.  Pulmonary:     Effort: Pulmonary effort is normal.     Breath sounds: Normal breath sounds.  Abdominal:     General: Bowel sounds are normal.     Palpations: Abdomen is soft.  Musculoskeletal:     Cervical back: Neck supple.     Right lower leg: No edema.     Left lower leg: No edema.  Skin:    General: Skin is warm.     Capillary Refill: Capillary refill takes less than 2 seconds.  Neurological:     General: No focal deficit present.     Mental Status: She is alert and oriented to person, place, and time.  Psychiatric:        Mood and Affect: Mood normal.     Labs reviewed: Basic Metabolic Panel: Recent Labs    11/03/22 0947 07/28/23 0825  NA 138 137  K 4.4 4.6  CL 103 101  CO2 28 27  GLUCOSE 111* 100*  BUN 18 24  CREATININE 0.87 0.82  CALCIUM 10.1 9.8   Liver Function Tests: Recent Labs    11/03/22 0947 07/28/23 0825  AST 16 18  ALT 17 18  ALKPHOS 81  --   BILITOT 0.7 0.8  PROT 7.4 6.8  ALBUMIN 4.6  --    No results for input(s): "LIPASE", "AMYLASE" in the last  8760 hours. No results for input(s): "AMMONIA"  in the last 8760 hours. CBC: Recent Labs    11/03/22 0947 07/28/23 0825  WBC 11.6* 7.6  NEUTROABS 7.6 3,549  HGB 14.0 14.5  HCT 41.4 43.9  MCV 89.2 90.7  PLT 268 296   Lipid Panel: No results for input(s): "CHOL", "HDL", "LDLCALC", "TRIG", "CHOLHDL", "LDLDIRECT" in the last 8760 hours. TSH: No results for input(s): "TSH" in the last 8760 hours. A1C: Lab Results  Component Value Date   HGBA1C 5.9 (H) 05/02/2019     Assessment/Plan 1. Essential hypertension, benign (Primary) - uncontrolled, goal < 150/90 - asymptomatic - past use of amlodipine 5 mg  - will restart amlodipine 5 mg today - home blood pressures BID x 7 days prior to next f/u  - bmp next visit  - amLODipine (NORVASC) 5 MG tablet; Take 1 tablet (5 mg total) by mouth daily.  Dispense: 90 tablet; Refill: 1 - Basic Metabolic Panel with eGFR; Future  2. Need for pneumococcal 20-valent conjugate vaccination - Pneumococcal conjugate vaccine 20-valent (Prevnar 20)  3. Colon cancer screening - Ambulatory referral to Gastroenterology  Total time: 32 minutes. Greater than 50% of total time spent doing patient education regarding health maintenance, HTN, and vaccinations including symptom/medication management.    Next appt: 08/20/2023  Hazle Nordmann, Juel Burrow  [facility] [phone]

## 2023-07-30 NOTE — Patient Instructions (Addendum)
Continue cerave lotion on hands> add vaseline on top to lock in moisture   Take blood pressures twice daily x 7 days> after starting amlodipine  Contact provider if you become dizzy or lightheaded  Limit sodium in diet< 2000 mg daily  OK to get RSV vaccine in 1 week

## 2023-08-20 ENCOUNTER — Ambulatory Visit (INDEPENDENT_AMBULATORY_CARE_PROVIDER_SITE_OTHER): Payer: Medicare Other | Admitting: Orthopedic Surgery

## 2023-08-20 ENCOUNTER — Encounter: Payer: Self-pay | Admitting: Orthopedic Surgery

## 2023-08-20 VITALS — BP 144/74 | HR 75 | Temp 97.2°F | Resp 17 | Ht 64.0 in | Wt 193.4 lb

## 2023-08-20 DIAGNOSIS — Z6833 Body mass index (BMI) 33.0-33.9, adult: Secondary | ICD-10-CM

## 2023-08-20 DIAGNOSIS — I10 Essential (primary) hypertension: Secondary | ICD-10-CM

## 2023-08-20 DIAGNOSIS — E66811 Obesity, class 1: Secondary | ICD-10-CM

## 2023-08-20 DIAGNOSIS — E6609 Other obesity due to excess calories: Secondary | ICD-10-CM

## 2023-08-20 DIAGNOSIS — Z1211 Encounter for screening for malignant neoplasm of colon: Secondary | ICD-10-CM

## 2023-08-20 NOTE — Progress Notes (Signed)
 Careteam: Patient Care Team: Gil Greig BRAVO, NP as PCP - General (Adult Health Nurse Practitioner) Glean Stephane BROCKS, RN as Oncology Nurse Navigator Tyree Nanetta SAILOR, RN as Oncology Nurse Navigator Lanny Callander, MD as Consulting Physician (Hematology) Ebbie Cough, MD as Consulting Physician (General Surgery) Shannon Agent, MD as Consulting Physician (Radiation Oncology) Burton, Lacie K, NP as Nurse Practitioner (Nurse Practitioner)  Seen by: [name], [name]  PLACE OF SERVICE:  [facility]  Advanced Directive information Does Patient Have a Medical Advance Directive?: Yes, Type of Advance Directive: Living will;Out of facility DNR (pink MOST or yellow form), Does patient want to make changes to medical advance directive?: No - Patient declined  Allergies  Allergen Reactions   Neomycin Rash   Zocor [Simvastatin]     Leg pain   Wasp Venom Protein Itching    Blood pressure goes up   Yellow Jacket Venom [Bee Venom] Itching    Blood pressure up    Chief Complaint  Patient presents with   Follow-up    3 week follow up on blood pressure.    Immunizations    Discuss the need for Shingrix vaccine, DTAP vaccine, and Covid Booster.   Health Maintenance    Discuss the need for AWV, Colonoscopy, and Hepatitis C Screening.     HPI: Patient is a 73 y.o. female seen today for acute visit to recheck blood pressure.   12/19 home blood pressure readings > 150/90. Amlodipine  5 mg was restarted. She has been following low sodium diet and taking medication as prescribed. Home blood pressure readings now SBP 120-130's. She denies chest pain, dizziness or headache.   Referral to GI for colonoscopy placed last encounter. She denies being contacted. Will repeat referral.   Home blood pressures:  12/22- 125/58  12/29- 130/64, 127/78, 125/73  12/30- 130/71, 133/57  12/31- 137/70  01/01- 137/72  01/05- 134/66    Review of Systems:  Review of Systems  Constitutional: Negative.    HENT: Negative.    Eyes: Negative.   Respiratory: Negative.    Cardiovascular:  Negative for chest pain.  Gastrointestinal:  Negative for blood in stool.  Musculoskeletal: Negative.   Skin: Negative.   Neurological:  Negative for dizziness and headaches.  Psychiatric/Behavioral: Negative.      Past Medical History:  Diagnosis Date   Allergy 2016, I think   Yellow jacket sting   Breast cancer (HCC) 02/2021   Eczema    Family history of breast cancer 02/27/2021   Family history of glioblastoma 02/27/2021   Family history of melanoma 02/27/2021   Hyperlipidemia    Hypertension    Obesity    Pre-diabetes    Skin cancer Not sure of date   [facility]   Vitamin D  deficiency    Past Surgical History:  Procedure Laterality Date   BREAST BIOPSY Left 02/2021   CESAREAN SECTION     x3   CHOLECYSTECTOMY     MASTECTOMY Left 03/27/2021   MASTECTOMY W/ SENTINEL NODE BIOPSY Left 03/27/2021   Procedure: LEFT MASTECTOMY WITH LEFT AXILLARY SENTINEL LYMPH NODE BIOPSY;  Surgeon: Ebbie Cough, MD;  Location: [facility] OR;  Service: General;  Laterality: Left;   TONSILLECTOMY     TUBAL LIGATION     Not sure of date   Social History:   reports that she has never smoked. She has never used smokeless tobacco. She reports current alcohol use of about 1.0 standard drink of alcohol per week. She reports that she does  not use drugs.  Family History  Problem Relation Age of Onset   Breast cancer Mother 85   Diabetes Father    Brain cancer Father    Cancer Father 81       glioblastoma   Breast cancer Maternal Aunt        dx after 50   Melanoma Maternal Uncle        dx after 50, arm    Medications: Patient's Medications  New Prescriptions   No medications on file  Previous Medications   ACETAMINOPHEN  (TYLENOL ) 500 MG TABLET    Take 500 mg by mouth every 6 (six) hours as needed for moderate pain or headache.   AMLODIPINE  (NORVASC ) 5 MG TABLET    Take 1 tablet (5 mg total) by  mouth daily.   CHOLECALCIFEROL 25 MCG (1000 UT) CHEW    Chew 1,000 Units by mouth daily.   HYDROXYZINE  (VISTARIL ) 25 MG CAPSULE    Take 1 capsule (25 mg total) by mouth every 8 (eight) hours as needed.   MELATONIN 3 MG TABS TABLET    Take 6 mg by mouth at bedtime as needed (sleep).   MULTIPLE VITAMIN (MULTIVITAMIN) CAPSULE    Take 1 capsule by mouth daily.   VALACYCLOVIR  (VALTREX ) 500 MG TABLET    Take 2 tablets (1,000 mg total) by mouth daily as needed.  Modified Medications   No medications on file  Discontinued Medications   No medications on file    Physical Exam:  Vitals:   08/20/23 0954  BP: (!) 144/74  Pulse: 75  Resp: 17  Temp: (!) 97.2 F (36.2 C)  SpO2: 97%  Weight: 193 lb 6.4 oz (87.7 kg)  Height: 5' 4 (1.626 m)   Body mass index is 33.2 kg/m. Wt Readings from Last 3 Encounters:  08/20/23 193 lb 6.4 oz (87.7 kg)  07/30/23 198 lb 6.4 oz (90 kg)  07/02/23 204 lb 9.6 oz (92.8 kg)    Physical Exam Vitals reviewed.  Constitutional:      General: She is not in acute distress. HENT:     Head: Normocephalic.  Eyes:     General:        Right eye: No discharge.        Left eye: No discharge.  Neck:     Vascular: No carotid bruit.  Cardiovascular:     Rate and Rhythm: Normal rate and regular rhythm.     Pulses: Normal pulses.     Heart sounds: Normal heart sounds.  Pulmonary:     Effort: Pulmonary effort is normal.     Breath sounds: Normal breath sounds.  Abdominal:     Palpations: Abdomen is soft.  Musculoskeletal:     Cervical back: Neck supple.     Right lower leg: No edema.     Left lower leg: No edema.  Skin:    General: Skin is warm.     Capillary Refill: Capillary refill takes less than 2 seconds.  Neurological:     General: No focal deficit present.     Mental Status: She is alert and oriented to person, place, and time.  Psychiatric:        Mood and Affect: Mood normal.     Labs reviewed: Basic Metabolic Panel: Recent Labs     11/03/22 0947 07/28/23 0825  NA 138 137  K 4.4 4.6  CL 103 101  CO2 28 27  GLUCOSE 111* 100*  BUN 18 24  CREATININE 0.87 0.82  CALCIUM 10.1 9.8   Liver Function Tests: Recent Labs    11/03/22 0947 07/28/23 0825  AST 16 18  ALT 17 18  ALKPHOS 81  --   BILITOT 0.7 0.8  PROT 7.4 6.8  ALBUMIN 4.6  --    No results for input(s): LIPASE, AMYLASE in the last 8760 hours. No results for input(s): AMMONIA in the last 8760 hours. CBC: Recent Labs    11/03/22 0947 07/28/23 0825  WBC 11.6* 7.6  NEUTROABS 7.6 3,549  HGB 14.0 14.5  HCT 41.4 43.9  MCV 89.2 90.7  PLT 268 296   Lipid Panel: No results for input(s): CHOL, HDL, LDLCALC, TRIG, CHOLHDL, LDLDIRECT in the last 8760 hours. TSH: No results for input(s): TSH in the last 8760 hours. A1C: Lab Results  Component Value Date   HGBA1C 5.9 (H) 05/02/2019     Assessment/Plan 1. Essential hypertension, benign (Primary) - 12/19 home blood pressures> 150/90 - amlodipine  5 mg started> past use of drug - improved pressures today, no SE - cont low sodium diet - cont amlodipine   - EKG 2022>unable to view results - Basic Metabolic Panel with eGFR  2. Colon cancer screening - Ambulatory referral to Gastroenterology  3. Class 1 obesity due to excess calories without serious comorbidity with body mass index (BMI) of 33.0 to 33.9 in adult - BMI 33.20 - recommend exercise 150 min/week - limit calories to < 1500/daily  Total time: 28 minutes. Greater than 50% of total time spent doing patient education regarding health maintenance, HTN and weight loss including symptom/medication management.    Next appt: Visit date not found  Terri Hawkins Gil BODILY  [facility] [phone]

## 2023-08-20 NOTE — Patient Instructions (Addendum)
 Continue amlodipine for blood pressure  Limit sale in diet  If you feel dizziness or lightheadedness please let me know   Please have colonoscopy done in 2025  Schedule medicare annual wellness with me sometime this year

## 2023-08-21 LAB — BASIC METABOLIC PANEL WITH GFR
BUN: 17 mg/dL (ref 7–25)
CO2: 26 mmol/L (ref 20–32)
Calcium: 10.1 mg/dL (ref 8.6–10.4)
Chloride: 105 mmol/L (ref 98–110)
Creat: 0.8 mg/dL (ref 0.60–1.00)
Glucose, Bld: 109 mg/dL (ref 65–139)
Potassium: 4.4 mmol/L (ref 3.5–5.3)
Sodium: 141 mmol/L (ref 135–146)
eGFR: 78 mL/min/{1.73_m2} (ref >=60)

## 2023-08-24 ENCOUNTER — Ambulatory Visit: Payer: Medicare Other

## 2023-09-21 ENCOUNTER — Ambulatory Visit: Payer: Medicare Other | Attending: General Surgery

## 2023-09-21 VITALS — Wt 195.1 lb

## 2023-09-21 DIAGNOSIS — Z483 Aftercare following surgery for neoplasm: Secondary | ICD-10-CM | POA: Insufficient documentation

## 2023-09-21 NOTE — Therapy (Signed)
 OUTPATIENT PHYSICAL THERAPY SOZO SCREENING NOTE   Patient Name: Terri Hawkins MRN: [id number] DOB:1950/11/21, 73 y.o., female Today's Date: 09/21/2023  PCP: Arnetha Bhat, NP REFERRING PROVIDER: Enid Harry, MD   PT End of Session - 09/21/23 1644     Visit Number 6   # unchanged due to screen only   PT Start Time 1642    PT Stop Time 1646    PT Time Calculation (min) 4 min    Activity Tolerance Patient tolerated treatment well    Behavior During Therapy [facility] for tasks assessed/performed             Past Medical History:  Diagnosis Date   Allergy 2016, I think   Yellow jacket sting   Breast cancer (HCC) 02/2021   Eczema    Family history of breast cancer 02/27/2021   Family history of glioblastoma 02/27/2021   Family history of melanoma 02/27/2021   Hyperlipidemia    Hypertension    Obesity    Pre-diabetes    Skin cancer Not sure of date   [facility]   Vitamin D  deficiency    Past Surgical History:  Procedure Laterality Date   BREAST BIOPSY Left 02/2021   CESAREAN SECTION     x3   CHOLECYSTECTOMY     MASTECTOMY Left 03/27/2021   MASTECTOMY W/ SENTINEL NODE BIOPSY Left 03/27/2021   Procedure: LEFT MASTECTOMY WITH LEFT AXILLARY SENTINEL LYMPH NODE BIOPSY;  Surgeon: Enid Harry, MD;  Location: [facility] OR;  Service: General;  Laterality: Left;   TONSILLECTOMY     TUBAL LIGATION     Not sure of date   Patient Active Problem List   Diagnosis Date Noted   S/P mastectomy, left 03/27/2021   Genetic testing 03/12/2021   Family history of breast cancer 02/27/2021   Family history of melanoma 02/27/2021   Family history of glioblastoma 02/27/2021   Malignant neoplasm of upper-outer quadrant of left breast in female, estrogen receptor positive (HCC) 02/22/2021   Essential hypertension, benign 01/24/2011   Hyperlipidemia 01/24/2011   Vitamin D  deficiency 01/24/2011    REFERRING DIAG: left breast cancer at risk for lymphedema  THERAPY DIAG:  Aftercare following surgery for neoplasm  PERTINENT HISTORY: Suspicious area found with screening mammogram.  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  She started neoadjuvant Anastrazole, but started having some sideeffects so is to discontinue for a week. Left mastectomy with SLNB performed on  03/27/2021. Drain removed on 04/05/2021   PRECAUTIONS: left UE Lymphedema risk, None  SUBJECTIVE: Pt returns for her 6 month L-Dex screen.   PAIN:  Are you having pain? No  SOZO SCREENING: Patient was assessed today using the SOZO machine to determine the lymphedema index score. This was compared to her baseline score. It was determined that she is within the recommended range when compared to her baseline and no further action is needed at this time. She will continue SOZO screenings. These are done every 3 months for 2 years post operatively followed by every 6 months for 2 years, and then annually.   L-DEX FLOWSHEETS - 09/21/23 1600       L-DEX LYMPHEDEMA SCREENING   Measurement Type Unilateral    L-DEX MEASUREMENT EXTREMITY Upper Extremity    POSITION  Standing    DOMINANT SIDE Left    At Risk Side Left    BASELINE SCORE (UNILATERAL) -6.1    L-DEX SCORE (UNILATERAL) -4.2  VALUE CHANGE (UNILAT) 1.9            P: Cont 6 month L-Dex screens next.   Denyce Flank, PTA 09/21/2023, 4:45 PM

## 2023-11-02 ENCOUNTER — Other Ambulatory Visit: Payer: Self-pay | Admitting: Nurse Practitioner

## 2023-11-02 ENCOUNTER — Other Ambulatory Visit: Payer: Self-pay

## 2023-11-02 DIAGNOSIS — Z170 Estrogen receptor positive status [ER+]: Secondary | ICD-10-CM

## 2023-11-02 DIAGNOSIS — C50412 Malignant neoplasm of upper-outer quadrant of left female breast: Secondary | ICD-10-CM

## 2023-11-02 NOTE — Progress Notes (Unsigned)
 Patient Care Team: Terri Heir, NP as PCP - General (Adult Health Nurse Practitioner) Terri Proud, RN as Oncology Nurse Navigator Terri Angelica, RN as Oncology Nurse Navigator Terri Mood, MD as Consulting Physician (Hematology) Terri Loron, MD as Consulting Physician (General Surgery) Terri Blackbird, MD as Consulting Physician (Radiation Oncology) Terri Samples, NP as Nurse Practitioner (Nurse Practitioner)   CHIEF COMPLAINT: Follow up left breast cancer   Oncology History Overview Note  Cancer Staging Malignant neoplasm of upper-outer quadrant of left breast in female, estrogen receptor positive (HCC) Staging form: Breast, AJCC 8th Edition - Clinical stage from 02/19/2021: Stage IB (cT2, cN0, cM0, G2, ER+, PR+, HER2-) - Signed by Terri Mood, MD on 02/26/2021 Stage prefix: Initial diagnosis Histologic grading system: 3 grade system    Malignant neoplasm of upper-outer quadrant of left breast in female, estrogen receptor positive (HCC)  01/16/2021 Mammogram   DIGITAL SCREENING BILATERAL MAMMOGRAM WITH TOMOSYNTHESIS AND CAD  IMPRESSION: Further evaluation is suggested for a possible mass in the left breast.   02/08/2021 Mammogram   DIGITAL DIAGNOSTIC UNILATERAL LEFT MAMMOGRAM WITH TOMOSYNTHESIS AND CAD; ULTRASOUND LEFT BREAST LIMITED  IMPRESSION: 1. Highly suspicious left breast mass at the 12 o'clock position corresponding with the screening mammographic findings. It measures 2.6 x 2.1 x 1.5 cm. Recommendation is for ultrasound-guided biopsy. 2. Two additional indeterminate left breast masses at the 2 o'clock position 6 cm and 7 cm from the nipple. One of these likely corresponds with an additional mammographic mass. Recommendation is for ultrasound-guided biopsy. 3. No suspicious left axillary lymphadenopathy.     02/19/2021 Pathology Results   Diagnosis 1. Breast, left, needle core biopsy, 2 o'clock 6 cmfn (ribbon) - INTRADUCTAL PAPILLOMA WITHOUT ATYPIA 2.  Breast, left, needle core biopsy, 2 o'clock 7 cmfn (coil) - DUCTAL CARCINOMA IN-SITU ARISING IN INTRADUCTAL PAPILLOMA - SEE COMMENT 3. Breast, left, needle core biopsy, 12 o'clock (heart) - INVASIVE DUCTAL CARCINOMA - SEE COMMENT Microscopic Comment 3. Based on the biopsy, the carcinoma appears Nottingham grade 2 of 3 and measures 0.7 cm in greatest linear extent.  3. PROGNOSTIC INDICATORS Results: IMMUNOHISTOCHEMICAL AND MORPHOMETRIC ANALYSIS PERFORMED MANUALLY The tumor cells are EQUIVOCAL for Her2 (2+). Her2 by FISH will be performed and results reported separately. Estrogen Receptor: 95%, POSITIVE, STRONG STAINING INTENSITY Progesterone Receptor: 95%, POSITIVE, STRONG STAINING INTENSITY Proliferation Marker Ki67: 5%  3. FLUORESCENCE IN-SITU HYBRIDIZATION Results: GROUP 5: HER2 **NEGATIVE** Equivocal form of amplification of the HER2 gene was detected in the IHC 2+ tissue sample received from this individual. HER2 FISH was performed by a technologist and cell imaging and analysis on the [employer]. RATIO OF HER2/CEN17 SIGNALS 1.43 AVERAGE HER2 COPY NUMBER PER CELL 2.00   02/19/2021 Cancer Staging   Staging form: Breast, AJCC 8th Edition - Clinical stage from 02/19/2021: Stage IB (cT2, cN0, cM0, G2, ER+, PR+, HER2-) - Signed by Terri Mood, MD on 02/26/2021 Stage prefix: Initial diagnosis Histologic grading system: 3 grade system   02/22/2021 Initial Diagnosis   Malignant neoplasm of upper-outer quadrant of left breast in female, estrogen receptor positive (HCC)   03/05/2021 Genetic Testing   No pathogenic variants detected in [facility] BRCAPlus Panel and [facility] CancerNext-Expanded +RNAinsight Panel.  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  Of note, at the time of testing, [facility] classified this variant as uncertain; however, other laboratories classify this as likely pathogenic.  The report dates are March 05, 2021 and March 18, 2021.   The BRCAplus panel  offered by [employer] [facility] and includes sequencing and deletion/duplication analysis for the following 8 genes: ATM, BRCA1, BRCA2, CDH1, CHEK2, PALB2, PTEN, and TP53.  The CancerNext-Expanded gene panel offered by [facility] and includes sequencing, rearrangement, and RNA analysis for the following 77 genes: AIP, ALK, APC, ATM, AXIN2, BAP1, BARD1, BLM, BMPR1A, BRCA1, BRCA2, BRIP1, CDC73, CDH1, CDK4, CDKN1B, CDKN2A, CHEK2, CTNNA1, DICER1, FANCC, FH, FLCN, GALNT12, KIF1B, LZTR1, MAX, MEN1, MET, MLH1, MSH2, MSH3, MSH6, MUTYH, NBN, NF1, NF2, NTHL1, PALB2, PHOX2B, PMS2, POT1, PRKAR1A, PTCH1, PTEN, RAD51C, RAD51D, RB1, RECQL, RET, SDHA, SDHAF2, SDHB, SDHC, SDHD, SMAD4, SMARCA4, SMARCB1, SMARCE1, STK11, SUFU, TMEM127, TP53, TSC1, TSC2, VHL and XRCC2 (sequencing and deletion/duplication); EGFR, EGLN1, HOXB13, KIT, MITF, PDGFRA, POLD1, and POLE (sequencing only); EPCAM and GREM1 (deletion/duplication only).    03/27/2021 Cancer Staging   Staging form: Breast, AJCC 8th Edition - Pathologic stage from 03/27/2021: Stage IA (pT2, pN0, cM0, G2, ER+, PR+, HER2-) - Signed by Terri Mood, MD on 05/01/2021 Stage prefix: Initial diagnosis Histologic grading system: 3 grade system Residual tumor (R): R0 - None   07/31/2021 Survivorship   SCP delivered by Santiago Glad, NP      CURRENT THERAPY: Surveillance   INTERVAL HISTORY Terri Hawkins returns for follow up as scheduled, last seen by me 11/03/22. In April 2024 she had bad episode of back pain, saw Dr. Lajoyce Corners, and completed steroid taper of 4-5 months which was "magical" and has not had recurrent issues. Denies changes in her breasts/chest wall. Denies other bone pain, RUQ pain, dyspnea, unintentional weight loss, or extreme fatigue.  ROS  All other systems reviewed and negative  Past Medical History:  Diagnosis Date   Allergy 2016, I think   Yellow jacket sting   Breast cancer (HCC) 02/2021   Eczema    Family history of breast cancer 02/27/2021   Family history  of glioblastoma 02/27/2021   Family history of melanoma 02/27/2021   Hyperlipidemia    Hypertension    Obesity    Pre-diabetes    Skin cancer Not sure of date   [facility]   Vitamin D deficiency      Past Surgical History:  Procedure Laterality Date   BREAST BIOPSY Left 02/2021   CESAREAN SECTION     x3   CHOLECYSTECTOMY     MASTECTOMY Left 03/27/2021   MASTECTOMY W/ SENTINEL NODE BIOPSY Left 03/27/2021   Procedure: LEFT MASTECTOMY WITH LEFT AXILLARY SENTINEL LYMPH NODE BIOPSY;  Surgeon: Terri Loron, MD;  Location: [facility] OR;  Service: General;  Laterality: Left;   TONSILLECTOMY     TUBAL LIGATION     Not sure of date     Outpatient Encounter Medications as of 11/03/2023  Medication Sig   acetaminophen (TYLENOL) 500 MG tablet Take 500 mg by mouth every 6 (six) hours as needed for moderate pain or headache.   amLODipine (NORVASC) 5 MG tablet Take 1 tablet (5 mg total) by mouth daily.   Cholecalciferol 25 MCG (1000 UT) CHEW Chew 1,000 Units by mouth daily.   hydrOXYzine (VISTARIL) 25 MG capsule Take 1 capsule (25 mg total) by mouth every 8 (eight) hours as needed.   melatonin 3 MG TABS tablet Take 6 mg by mouth at bedtime as needed (sleep).   Multiple Vitamin (MULTIVITAMIN) capsule Take 1 capsule by mouth daily.   valACYclovir (VALTREX) 500 MG tablet Take 2 tablets (1,000 mg total) by mouth daily as needed.   No facility-administered encounter medications on file as of 11/03/2023.  Today's Vitals   11/03/23 1012  BP: 132/68  Pulse: 72  Resp: 19  Temp: (!) 97.3 F (36.3 C)  TempSrc: Temporal  SpO2: 97%  Weight: 195 lb 12.8 oz (88.8 kg)  Height: 5\' 4"  (1.626 m)  PainSc: 0-No pain   Body mass index is 33.61 kg/m.   PHYSICAL EXAM GENERAL:alert, no distress and comfortable SKIN: no rash  EYES: sclera clear NECK: without mass LYMPH:  no palpable cervical or supraclavicular lymphadenopathy  LUNGS:  normal breathing effort HEART: no lower extremity  edema ABDOMEN: abdomen soft, non-tender and normal bowel sounds NEURO: alert & oriented x 3 with fluent speech, no focal motor/sensory deficits Breast exam: s/p left mastectomy, incisions completely healed. No palpable mass/nodularity along the left chest wall, right breast, or either axilla that I could appreciate    CBC    Component Value Date/Time   WBC 9.1 11/03/2023 0958   WBC 7.6 07/28/2023 0825   RBC 4.62 11/03/2023 0958   HGB 13.5 11/03/2023 0958   HCT 41.4 11/03/2023 0958   PLT 293 11/03/2023 0958   MCV 89.6 11/03/2023 0958   MCH 29.2 11/03/2023 0958   MCHC 32.6 11/03/2023 0958   RDW 13.6 11/03/2023 0958   LYMPHSABS 3.6 11/03/2023 0958   MONOABS 0.7 11/03/2023 0958   EOSABS 0.3 11/03/2023 0958   BASOSABS 0.0 11/03/2023 0958     CMP     Component Value Date/Time   NA 140 11/03/2023 0958   NA 141 05/27/2021 1115   K 4.2 11/03/2023 0958   CL 106 11/03/2023 0958   CO2 27 11/03/2023 0958   GLUCOSE 101 (H) 11/03/2023 0958   BUN 14 11/03/2023 0958   BUN 15 05/27/2021 1115   CREATININE 0.82 11/03/2023 0958   CREATININE 0.80 08/20/2023 1013   CALCIUM 9.8 11/03/2023 0958   PROT 7.4 11/03/2023 0958   PROT 7.3 05/02/2019 1425   ALBUMIN 4.5 11/03/2023 0958   ALBUMIN 4.8 05/02/2019 1425   AST 18 11/03/2023 0958   ALT 18 11/03/2023 0958   ALKPHOS 80 11/03/2023 0958   BILITOT 0.5 11/03/2023 0958   GFRNONAA >60 11/03/2023 0958   GFRAA 78 05/02/2019 1425     ASSESSMENT & PLAN:Terri Hawkins is a 73 y.o. post-menopausal female with    1. Malignant neoplasm of upper-outer quadrant of left breast, Invasive Ductal Carcinoma, Stage IB , pT2N0 cM0, ER+/PR+/HER2-, Grade 2  -Diagnosed 02/2021, she started anastrozole preoperatively on 02/27/21 and s/p left mastectomy on 03/27/21 with Dr. Dwain Sarna  -Oncotype RS of 1, low risk. -she discontinued anastrozole in 07/2021 due to increasing hot flashes and joint pain, and was not interested in Tamoxifen.  -Ms. Tarkington is clinically  doing well, exam is benign, labs are stable.  Overall no clinical concern for recurrence.  Continue breast cancer surveillance -Follow-up in 1 year, or sooner if needed   2. Genetics -testing done 02/27/21. Results were negative with VUS in CHEK2.   3. Bone Health -DEXA on 08/16/21 showed osteopenia (T-score of -1.8 at AP spine) -Repeat per PCP -on Vit D   4.  Health maintenance -Stopped BP med on her own due to nocturia but has restarted     PLAN: -Labs reviewed -Continue breast cancer surveillance -Screening right mammo 01/2024, ordered today -Follow-up in 1 year, or sooner if needed  Orders Placed This Encounter  Procedures   MM 3D SCREENING MAMMOGRAM UNILATERAL RIGHT BREAST    Standing Status:   Future    Expected Date:   01/22/2024  Expiration Date:   11/02/2024    Reason for Exam (SYMPTOM  OR DIAGNOSIS REQUIRED):   h/o left breast cancer s/p mastectomy    Preferred imaging location?:   [facility]      All questions were answered. The patient knows to call the clinic with any problems, questions or concerns. No barriers to learning were detected.   Santiago Glad, NP-C 11/03/2023

## 2023-11-03 ENCOUNTER — Inpatient Hospital Stay: Payer: PRIVATE HEALTH INSURANCE | Attending: Hematology

## 2023-11-03 ENCOUNTER — Encounter: Payer: Self-pay | Admitting: Nurse Practitioner

## 2023-11-03 ENCOUNTER — Inpatient Hospital Stay (HOSPITAL_BASED_OUTPATIENT_CLINIC_OR_DEPARTMENT_OTHER): Payer: PRIVATE HEALTH INSURANCE | Admitting: Nurse Practitioner

## 2023-11-03 VITALS — BP 132/68 | HR 72 | Temp 97.3°F | Resp 19 | Ht 64.0 in | Wt 195.8 lb

## 2023-11-03 DIAGNOSIS — M8588 Other specified disorders of bone density and structure, other site: Secondary | ICD-10-CM | POA: Insufficient documentation

## 2023-11-03 DIAGNOSIS — Z170 Estrogen receptor positive status [ER+]: Secondary | ICD-10-CM | POA: Diagnosis not present

## 2023-11-03 DIAGNOSIS — C50412 Malignant neoplasm of upper-outer quadrant of left female breast: Secondary | ICD-10-CM | POA: Insufficient documentation

## 2023-11-03 DIAGNOSIS — Z79899 Other long term (current) drug therapy: Secondary | ICD-10-CM | POA: Insufficient documentation

## 2023-11-03 DIAGNOSIS — Z1231 Encounter for screening mammogram for malignant neoplasm of breast: Secondary | ICD-10-CM | POA: Diagnosis not present

## 2023-11-03 LAB — CMP (CANCER CENTER ONLY)
ALT: 18 U/L (ref 0–44)
AST: 18 U/L (ref 15–41)
Albumin: 4.5 g/dL (ref 3.5–5.0)
Alkaline Phosphatase: 80 U/L (ref 38–126)
Anion gap: 7 (ref 5–15)
BUN: 14 mg/dL (ref 8–23)
CO2: 27 mmol/L (ref 22–32)
Calcium: 9.8 mg/dL (ref 8.9–10.3)
Chloride: 106 mmol/L (ref 98–111)
Creatinine: 0.82 mg/dL (ref 0.44–1.00)
GFR, Estimated: >60 mL/min (ref >60)
Glucose, Bld: 101 mg/dL — ABNORMAL HIGH (ref 70–99)
Potassium: 4.2 mmol/L (ref 3.5–5.1)
Sodium: 140 mmol/L (ref 135–145)
Total Bilirubin: 0.5 mg/dL (ref 0.0–1.2)
Total Protein: 7.4 g/dL (ref 6.5–8.1)

## 2023-11-03 LAB — CBC WITH DIFFERENTIAL (CANCER CENTER ONLY)
Abs Immature Granulocytes: 0.02 10*3/uL (ref 0.00–0.07)
Basophils Absolute: 0 10*3/uL (ref 0.0–0.1)
Basophils Relative: 0 %
Eosinophils Absolute: 0.3 10*3/uL (ref 0.0–0.5)
Eosinophils Relative: 4 %
HCT: 41.4 % (ref 36.0–46.0)
Hemoglobin: 13.5 g/dL (ref 12.0–15.0)
Immature Granulocytes: 0 %
Lymphocytes Relative: 39 %
Lymphs Abs: 3.6 10*3/uL (ref 0.7–4.0)
MCH: 29.2 pg (ref 26.0–34.0)
MCHC: 32.6 g/dL (ref 30.0–36.0)
MCV: 89.6 fL (ref 80.0–100.0)
Monocytes Absolute: 0.7 10*3/uL (ref 0.1–1.0)
Monocytes Relative: 8 %
Neutro Abs: 4.5 10*3/uL (ref 1.7–7.7)
Neutrophils Relative %: 49 %
Platelet Count: 293 10*3/uL (ref 150–400)
RBC: 4.62 MIL/uL (ref 3.87–5.11)
RDW: 13.6 % (ref 11.5–15.5)
WBC Count: 9.1 10*3/uL (ref 4.0–10.5)
nRBC: 0 % (ref 0.0–0.2)

## 2024-01-23 ENCOUNTER — Other Ambulatory Visit: Payer: Self-pay | Admitting: Orthopedic Surgery

## 2024-01-23 DIAGNOSIS — I10 Essential (primary) hypertension: Secondary | ICD-10-CM

## 2024-01-26 ENCOUNTER — Ambulatory Visit
Admission: RE | Admit: 2024-01-26 | Discharge: 2024-01-26 | Disposition: A | Discharge status: Home / Self Care | Payer: PRIVATE HEALTH INSURANCE | Source: Ambulatory Visit | Attending: Nurse Practitioner | Admitting: Nurse Practitioner

## 2024-01-26 DIAGNOSIS — Z1231 Encounter for screening mammogram for malignant neoplasm of breast: Secondary | ICD-10-CM

## 2024-04-04 ENCOUNTER — Ambulatory Visit: Payer: PRIVATE HEALTH INSURANCE | Attending: General Surgery

## 2024-04-04 VITALS — Wt 197.5 lb

## 2024-04-04 DIAGNOSIS — Z483 Aftercare following surgery for neoplasm: Secondary | ICD-10-CM | POA: Insufficient documentation

## 2024-04-04 NOTE — Therapy (Signed)
 OUTPATIENT PHYSICAL THERAPY SOZO SCREENING NOTE   Patient Name: Terri Hawkins MRN: [id number] DOB:Jan 07, 1951, 73 y.o., female Today's Date: 04/04/2024  PCP: Gil Greig BRAVO, NP REFERRING PROVIDER: Ebbie Cough, MD   PT End of Session - 04/04/24 1007     Visit Number 6   # unchanged due to screen only   PT Start Time 1005    PT Stop Time 1009    PT Time Calculation (min) 4 min    Activity Tolerance Patient tolerated treatment well    Behavior During Therapy [facility] for tasks assessed/performed          Past Medical History:  Diagnosis Date   Allergy 2016, I think   Yellow jacket sting   Breast cancer (HCC) 02/2021   Eczema    Family history of breast cancer 02/27/2021   Family history of glioblastoma 02/27/2021   Family history of melanoma 02/27/2021   Hyperlipidemia    Hypertension    Obesity    Pre-diabetes    Skin cancer Not sure of date   [facility]   Vitamin D  deficiency    Past Surgical History:  Procedure Laterality Date   BREAST BIOPSY Left 02/2021   CESAREAN SECTION     x3   CHOLECYSTECTOMY     MASTECTOMY Left 03/27/2021   MASTECTOMY W/ SENTINEL NODE BIOPSY Left 03/27/2021   Procedure: LEFT MASTECTOMY WITH LEFT AXILLARY SENTINEL LYMPH NODE BIOPSY;  Surgeon: Ebbie Cough, MD;  Location: [facility] OR;  Service: General;  Laterality: Left;   TONSILLECTOMY     TUBAL LIGATION     Not sure of date   Patient Active Problem List   Diagnosis Date Noted   S/P mastectomy, left 03/27/2021   Genetic testing 03/12/2021   Family history of breast cancer 02/27/2021   Family history of melanoma 02/27/2021   Family history of glioblastoma 02/27/2021   Malignant neoplasm of upper-outer quadrant of left breast in female, estrogen receptor positive (HCC) 02/22/2021   Essential hypertension, benign 01/24/2011   Hyperlipidemia 01/24/2011   Vitamin D  deficiency 01/24/2011    REFERRING DIAG: left breast cancer at risk for lymphedema  THERAPY DIAG:  Aftercare following surgery for neoplasm  PERTINENT HISTORY: Suspicious area found with screening mammogram.  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  She started neoadjuvant Anastrazole, but started having some sideeffects so is to discontinue for a week. Left mastectomy with SLNB performed on  03/27/2021. Drain removed on 04/05/2021   PRECAUTIONS: left UE Lymphedema risk, None  SUBJECTIVE: Pt returns for her 6 month L-Dex screen.   PAIN:  Are you having pain? No  SOZO SCREENING: Patient was assessed today using the SOZO machine to determine the lymphedema index score. This was compared to her baseline score. It was determined that she is within the recommended range when compared to her baseline and no further action is needed at this time. She will continue SOZO screenings. These are done every 3 months for 2 years post operatively followed by every 6 months for 2 years, and then annually.   L-DEX FLOWSHEETS - 04/04/24 1000       L-DEX LYMPHEDEMA SCREENING   Measurement Type Unilateral    L-DEX MEASUREMENT EXTREMITY Upper Extremity    POSITION  Standing    DOMINANT SIDE Left    At Risk Side Left    BASELINE SCORE (UNILATERAL) -6.1    L-DEX SCORE (UNILATERAL) -3.9    VALUE  CHANGE (UNILAT) 2.2         P: Cont 6 month L-Dex screens next.   Aden Berwyn Caldron, PTA 04/04/2024, 10:08 AM

## 2024-05-26 ENCOUNTER — Ambulatory Visit: Payer: Medicare Other | Admitting: Orthopedic Surgery

## 2024-05-26 ENCOUNTER — Encounter: Payer: Self-pay | Admitting: Orthopedic Surgery

## 2024-05-26 VITALS — BP 148/94 | HR 68 | Temp 96.9°F | Resp 16 | Ht 64.0 in | Wt 200.0 lb

## 2024-05-26 DIAGNOSIS — R739 Hyperglycemia, unspecified: Secondary | ICD-10-CM

## 2024-05-26 DIAGNOSIS — Z1211 Encounter for screening for malignant neoplasm of colon: Secondary | ICD-10-CM

## 2024-05-26 DIAGNOSIS — E66811 Obesity, class 1: Secondary | ICD-10-CM

## 2024-05-26 DIAGNOSIS — Z853 Personal history of malignant neoplasm of breast: Secondary | ICD-10-CM | POA: Diagnosis not present

## 2024-05-26 DIAGNOSIS — Z23 Encounter for immunization: Secondary | ICD-10-CM

## 2024-05-26 DIAGNOSIS — Z6834 Body mass index (BMI) 34.0-34.9, adult: Secondary | ICD-10-CM

## 2024-05-26 DIAGNOSIS — E7800 Pure hypercholesterolemia, unspecified: Secondary | ICD-10-CM

## 2024-05-26 DIAGNOSIS — I10 Essential (primary) hypertension: Secondary | ICD-10-CM | POA: Diagnosis not present

## 2024-05-26 DIAGNOSIS — E661 Drug-induced obesity: Secondary | ICD-10-CM

## 2024-05-26 MED ORDER — AMLODIPINE BESYLATE 5 MG PO TABS
7.5000 mg | ORAL_TABLET | Freq: Every day | ORAL | 1 refills | Status: DC
Start: 2024-05-26 — End: 2024-06-07

## 2024-05-26 NOTE — Patient Instructions (Addendum)
 Try vaseline on dry skin > consider doing before bed time  Continue tylenol  as needed for pain> ok to take a dose every morning if you have to   Increase amlodipine  1.5 tablets= 7.5 mg now> take once daily  Check blood pressures twice daily x 10 days> if your blood pressure are GREATER than 140/90> contact provider   Schedule fasting lab visit> no foods 12 hours before blood drawn and only water or black coffee morning of appointment

## 2024-05-26 NOTE — Progress Notes (Signed)
 Careteam: Patient Care Team: Gil Greig BRAVO, NP as PCP - General (Adult Health Nurse Practitioner) Tyree Nanetta SAILOR, RN as Oncology Nurse Navigator Lanny Callander, MD as Consulting Physician (Hematology) Ebbie Cough, MD as Consulting Physician (General Surgery) Shannon Agent, MD as Consulting Physician (Radiation Oncology) Burton, Lacie K, NP as Nurse Practitioner (Nurse Practitioner)  Seen by: [name], [name]  PLACE OF SERVICE:  [facility]  Advanced Directive information Does Patient Have a Medical Advance Directive?: Yes, Type of Advance Directive: Healthcare Power of [place];Living will;Out of facility DNR (pink MOST or yellow form), Does patient want to make changes to medical advance directive?: No - Patient declined  Allergies  Allergen Reactions   Neomycin Rash   Zocor [Simvastatin]     Leg pain   Wasp Venom Protein Itching    Blood pressure goes up   Yellow Jacket Venom [Bee Venom] Itching    Blood pressure up    Chief Complaint  Patient presents with   Medical Management of Chronic Issues    Routine visit. Discuss the need for 2nd Shingrix vaccine, DTAP vaccine, Influenza vaccine, Covid Booster, Hepatitis C Screening, Colonoscopy, and AWV.     HPI: Patient is a 73 y.o. female seen today for medical management of chronic conditions.   Discussed the use of AI scribe software for clinical note transcription with the patient, who gave verbal consent to proceed.  She experiences stiffness and arthritic pain, particularly in the lower back, which is more pronounced after sitting and improves with movement. She occasionally takes Tylenol  for the pain, which provides relief, but she does not take it daily.  She was diagnosed with left-sided breast cancer three years ago at the age of 39. She underwent a left mastectomy ans started on anastrozole . 07/2021 anastrozole  was stopped. Tamoxifen was recommended but declined due to side effect concerns. Yearly surveillance  recommended.   Her family history includes her father having glioblastoma and an uncle with melanoma. Her mother died of breast cancer. 02-11-2024 mammogram negative for malignancy.   She takes amlodipine  for blood pressure management and reports taking it as prescribed. She has a home device to monitor her blood pressure.   Review of Systems:  Review of Systems  Constitutional: Negative.   HENT: Negative.    Eyes: Negative.   Respiratory: Negative.    Cardiovascular: Negative.   Gastrointestinal: Negative.   Genitourinary: Negative.   Musculoskeletal:  Positive for joint pain.  Skin: Negative.   Neurological: Negative.   Psychiatric/Behavioral: Negative.      Past Medical History:  Diagnosis Date   Allergy 2016, I think   Yellow jacket sting   Breast cancer (HCC) 02/2021   Eczema    Family history of breast cancer 02/27/2021   Family history of glioblastoma 02/27/2021   Family history of melanoma 02/27/2021   Hyperlipidemia    Hypertension    Obesity    Pre-diabetes    Skin cancer Not sure of date   [facility]   Vitamin D  deficiency    Past Surgical History:  Procedure Laterality Date   BREAST BIOPSY Left 02/2021   CESAREAN SECTION     x3   CHOLECYSTECTOMY     MASTECTOMY Left 03/27/2021   MASTECTOMY W/ SENTINEL NODE BIOPSY Left 03/27/2021   Procedure: LEFT MASTECTOMY WITH LEFT AXILLARY SENTINEL LYMPH NODE BIOPSY;  Surgeon: Ebbie Cough, MD;  Location: [facility] OR;  Service: General;  Laterality: Left;   TONSILLECTOMY     TUBAL LIGATION  Not sure of date   Social History:   reports that she has never smoked. She has never used smokeless tobacco. She reports current alcohol use of about 1.0 standard drink of alcohol per week. She reports that she does not use drugs.  Family History  Problem Relation Age of Onset   Breast cancer Mother 49   Diabetes Father    Brain cancer Father    Cancer Father 81       glioblastoma   Breast cancer Maternal  Aunt        dx after 50   Melanoma Maternal Uncle        dx after 50, arm    Medications: Patient's Medications  New Prescriptions   No medications on file  Previous Medications   ACETAMINOPHEN  (TYLENOL ) 500 MG TABLET    Take 500 mg by mouth every 6 (six) hours as needed for moderate pain or headache.   AMLODIPINE  (NORVASC ) 5 MG TABLET    TAKE 1 TABLET (5 MG TOTAL) BY MOUTH DAILY.   CHOLECALCIFEROL 25 MCG (1000 UT) CHEW    Chew 1,000 Units by mouth daily.   HYDROXYZINE  (VISTARIL ) 25 MG CAPSULE    Take 1 capsule (25 mg total) by mouth every 8 (eight) hours as needed.   MELATONIN 3 MG TABS TABLET    Take 6 mg by mouth at bedtime as needed (sleep).   MULTIPLE VITAMIN (MULTIVITAMIN) CAPSULE    Take 1 capsule by mouth daily.   VALACYCLOVIR  (VALTREX ) 500 MG TABLET    Take 2 tablets (1,000 mg total) by mouth daily as needed.  Modified Medications   No medications on file  Discontinued Medications   No medications on file    Physical Exam:  Vitals:   05/26/24 1008  BP: (!) 142/80  Pulse: 68  Resp: 16  Temp: (!) 96.9 F (36.1 C)  SpO2: 98%  Weight: 200 lb (90.7 kg)  Height: 5' 4 (1.626 m)   Body mass index is 34.33 kg/m. Wt Readings from Last 3 Encounters:  05/26/24 200 lb (90.7 kg)  04/04/24 197 lb 8 oz (89.6 kg)  11/03/23 195 lb 12.8 oz (88.8 kg)    Physical Exam Vitals reviewed.  Constitutional:      General: She is not in acute distress.    Appearance: She is obese.  HENT:     Head: Normocephalic.  Eyes:     General:        Right eye: No discharge.        Left eye: No discharge.  Cardiovascular:     Rate and Rhythm: Normal rate and regular rhythm.     Pulses: Normal pulses.     Heart sounds: Normal heart sounds.  Pulmonary:     Effort: Pulmonary effort is normal.     Breath sounds: Normal breath sounds.  Abdominal:     General: Bowel sounds are normal. There is no distension.     Palpations: Abdomen is soft.     Tenderness: There is no abdominal  tenderness.  Musculoskeletal:     Right lower leg: No edema.     Left lower leg: No edema.  Skin:    General: Skin is warm and dry.     Capillary Refill: Capillary refill takes less than 2 seconds.  Neurological:     General: No focal deficit present.     Mental Status: She is alert and oriented to person, place, and time.  Psychiatric:  Mood and Affect: Mood normal.     Labs reviewed: Basic Metabolic Panel: Recent Labs    07/28/23 0825 08/20/23 1013 11/03/23 0958  NA 137 141 140  K 4.6 4.4 4.2  CL 101 105 106  CO2 27 26 27   GLUCOSE 100* 109 101*  BUN 24 17 14   CREATININE 0.82 0.80 0.82  CALCIUM 9.8 10.1 9.8   Liver Function Tests: Recent Labs    07/28/23 0825 11/03/23 0958  AST 18 18  ALT 18 18  ALKPHOS  --  80  BILITOT 0.8 0.5  PROT 6.8 7.4  ALBUMIN  --  4.5   No results for input(s): LIPASE, AMYLASE in the last 8760 hours. No results for input(s): AMMONIA in the last 8760 hours. CBC: Recent Labs    07/28/23 0825 11/03/23 0958  WBC 7.6 9.1  NEUTROABS 3,549 4.5  HGB 14.5 13.5  HCT 43.9 41.4  MCV 90.7 89.6  PLT 296 293   Lipid Panel: No results for input(s): CHOL, HDL, LDLCALC, TRIG, CHOLHDL, LDLDIRECT in the last 8760 hours. TSH: No results for input(s): TSH in the last 8760 hours. A1C: Lab Results  Component Value Date   HGBA1C 5.9 (H) 05/02/2019     Assessment/Plan 1. Immunization due (Primary) - Flu vaccine HIGH DOSE PF(Fluzone Trivalent)  2. Essential hypertension, benign - uncontrolled, goal < 140/90 - BUN/creat  - CBC with Differential/Platelet; Future - Complete Metabolic Panel with eGFR; Future - amLODipine  (NORVASC ) 5 MG tablet; Take 1.5 tablets (7.5 mg total) by mouth daily.  Dispense: 90 tablet; Refill: 1  3. Class 1 drug-induced obesity with serious comorbidity and body mass index (BMI) of 34.0 to 34.9 in adult - BMI 34.33  - no regular exercise regimen due to arthritis - recommend light walking>  150 minutes/week - recommend limiting calories to < 1500/daily  4. History of breast cancer - s/p left mastectomy 2022 - 07/2021 anastrozole  stopped  - refused Tamoxifen - 01/2024 mammogram negative  - no changes to breasts  5. Hyperglycemia - glucose 101 ( 10/2023), 111 (10/2022) - Hemoglobin A1c; Future  6. Pure hypercholesterolemia - total 242, LDL 156 05/2021 - allergy to Zocor  - recheck lipid panel - Lipid Panel; Future  7. Screening for colon cancer - no family history of colon cancer - h/o breast cancer 3 years ago - Cologuard  Total time: 34 minutes. Greater than 50% of total time spent doing patient education regarding health maintenance, HTN, HLD, breast cancer screening, joint pain and exercise including symptom/medication management.     Next appt: 08/25/2024  Greig Cluster, ELNITA  [facility] [phone]

## 2024-06-03 ENCOUNTER — Other Ambulatory Visit: Payer: Self-pay

## 2024-06-03 ENCOUNTER — Emergency Department (HOSPITAL_COMMUNITY)

## 2024-06-03 ENCOUNTER — Emergency Department (HOSPITAL_COMMUNITY)
Admission: EM | Admit: 2024-06-03 | Discharge: 2024-06-03 | Disposition: A | Discharge status: Home / Self Care | Attending: Emergency Medicine | Admitting: Emergency Medicine

## 2024-06-03 DIAGNOSIS — Z853 Personal history of malignant neoplasm of breast: Secondary | ICD-10-CM | POA: Diagnosis not present

## 2024-06-03 DIAGNOSIS — Z85828 Personal history of other malignant neoplasm of skin: Secondary | ICD-10-CM | POA: Insufficient documentation

## 2024-06-03 DIAGNOSIS — R110 Nausea: Secondary | ICD-10-CM | POA: Insufficient documentation

## 2024-06-03 DIAGNOSIS — R0789 Other chest pain: Secondary | ICD-10-CM | POA: Diagnosis not present

## 2024-06-03 DIAGNOSIS — I10 Essential (primary) hypertension: Secondary | ICD-10-CM | POA: Diagnosis not present

## 2024-06-03 DIAGNOSIS — R079 Chest pain, unspecified: Secondary | ICD-10-CM | POA: Diagnosis present

## 2024-06-03 LAB — CBC
HCT: 43.7 % (ref 36.0–46.0)
Hemoglobin: 14.4 g/dL (ref 12.0–15.0)
MCH: 29.4 pg (ref 26.0–34.0)
MCHC: 33 g/dL (ref 30.0–36.0)
MCV: 89.4 fL (ref 80.0–100.0)
Platelets: 319 K/uL (ref 150–400)
RBC: 4.89 MIL/uL (ref 3.87–5.11)
RDW: 13.2 % (ref 11.5–15.5)
WBC: 11 K/uL — ABNORMAL HIGH (ref 4.0–10.5)
nRBC: 0 % (ref 0.0–0.2)

## 2024-06-03 LAB — BASIC METABOLIC PANEL WITH GFR
Anion gap: 13 (ref 5–15)
BUN: 16 mg/dL (ref 8–23)
CO2: 22 mmol/L (ref 22–32)
Calcium: 9.6 mg/dL (ref 8.9–10.3)
Chloride: 101 mmol/L (ref 98–111)
Creatinine, Ser: 0.88 mg/dL (ref 0.44–1.00)
GFR, Estimated: >60 mL/min (ref >60)
Glucose, Bld: 122 mg/dL — ABNORMAL HIGH (ref 70–99)
Potassium: 3.8 mmol/L (ref 3.5–5.1)
Sodium: 136 mmol/L (ref 135–145)

## 2024-06-03 LAB — TROPONIN I (HIGH SENSITIVITY): Troponin I (High Sensitivity): 3 ng/L (ref <18)

## 2024-06-03 NOTE — ED Triage Notes (Signed)
 PT here for CP described as twinges of pain in right chest x 1 week with increase today including nausea and feeling shaky.  Pt does not appear in distress.

## 2024-06-03 NOTE — ED Provider Notes (Signed)
 [place] EMERGENCY DEPARTMENT AT [facility] Provider Note  MDM   HPI/ROS:  Laylanie Kruczek is a 73 y.o. female with a PMH of HTN, HLD, breast cancer who presents with chest pain.  She states that she has mild twinges in her right chest for the last week with increased today with nausea and feeling overall shaky.  Patient has no associated shortness of breath, nausea, vomiting.  She states that her shaking episode has resolved.  Physical exam is notable for: - Lungs are CTAB, no abdominal tenderness or pain, no lower extremity edema or periorbital edema. -- TTP over bilateral left and right chest wall  On my initial evaluation, patient is:  -Vital signs stable. Patient afebrile, hemodynamically stable, and non-toxic appearing.  I have low suspicion that this is PE, no unilateral lower extremity edema, no hormone use, no shortness of breath, patient HR is 67.  EKG without any acute ischemic equivalents, unchanged from prior.  Low suspicion that this is cardiac in origin.  BMP, CBC, troponin all WNL.  CXR without evidence of acute cardiopulmonary abnormality.  Patient initially hypertensive on her arrival, after rechecks without intervention, normotensive 134/58.  Low suspicion for acute aortic syndrome given no radiation to her back, no neurologic deficits and is currently normotensive.  Interpretations, interventions, and the patient's course of care are documented below.        Disposition:  I discussed the plan for discharge with the patient and/or their surrogate at bedside prior to discharge and they were in agreement with the plan and verbalized understanding of the return precautions provided. All questions answered to the best of my ability. Ultimately, the patient was discharged in stable condition with stable vital signs. I am reassured that they are capable of close follow up and good social support at home.   Clinical Impression:  1. Chest wall pain   2. Nausea       Clinical Complexity A medically appropriate history, review of systems, and physical exam was performed.  My independent interpretations of EKG, labs, and radiology are documented in the ED course above.   If decision rules were used in this patient's evaluation, they are listed below.   Click here for ABCD2, HEART and other calculators patient has a hear score of 3 Patient's presentation is most consistent with acute complicated illness / injury requiring diagnostic workup.  Medical Decision Making Amount and/or Complexity of Data Reviewed Labs: ordered. Radiology: ordered.    HPI/ROS      See MDM section for pertinent HPI and ROS. A complete ROS was performed with pertinent positives/negatives noted above.   Past Medical History:  Diagnosis Date   Allergy 2016, I think   Yellow jacket sting   Breast cancer (HCC) 02/2021   Eczema    Family history of breast cancer 02/27/2021   Family history of glioblastoma 02/27/2021   Family history of melanoma 02/27/2021   Hyperlipidemia    Hypertension    Obesity    Pre-diabetes    Skin cancer Not sure of date   [facility] Dermatology   Vitamin D  deficiency     Past Surgical History:  Procedure Laterality Date   BREAST BIOPSY Left 02/2021   CESAREAN SECTION     x3   CHOLECYSTECTOMY     MASTECTOMY Left 03/27/2021   MASTECTOMY W/ SENTINEL NODE BIOPSY Left 03/27/2021   Procedure: LEFT MASTECTOMY WITH LEFT AXILLARY SENTINEL LYMPH NODE BIOPSY;  Surgeon: Ebbie Cough, MD;  Location: [facility] OR;  Service:  General;  Laterality: Left;   TONSILLECTOMY     TUBAL LIGATION     Not sure of date      Physical Exam   Vitals:   06/03/24 0729  BP: (!) 171/75  Pulse: (!) 103  Resp: 19  Temp: 98.4 F (36.9 C)  TempSrc: Oral  SpO2: 99%    Physical Exam Vitals and nursing note reviewed.  Constitutional:      General: She is not in acute distress.    Appearance: She is well-developed.  HENT:     Head: Normocephalic and  atraumatic.  Eyes:     Conjunctiva/sclera: Conjunctivae normal.  Cardiovascular:     Rate and Rhythm: Normal rate and regular rhythm.     Heart sounds: No murmur heard. Pulmonary:     Effort: Pulmonary effort is normal. No respiratory distress.     Breath sounds: Normal breath sounds.  Abdominal:     Palpations: Abdomen is soft.     Tenderness: There is no abdominal tenderness.  Musculoskeletal:        General: No swelling.     Cervical back: Neck supple.  Skin:    General: Skin is warm and dry.     Capillary Refill: Capillary refill takes less than 2 seconds.  Neurological:     Mental Status: She is alert.  Psychiatric:        Mood and Affect: Mood normal.      Procedures   If procedures were preformed on this patient, they are listed below:  Procedures   Please note that this documentation was produced with the assistance of voice-to-text technology and may contain errors.     Billy Pal, MD 06/03/24 [id number]    Levander Houston, MD 06/03/24 1537

## 2024-06-03 NOTE — Discharge Instructions (Addendum)
 Terri Hawkins:  Thank you for allowing us  to take care of you today.  We hope you begin feeling better soon. You were seen today for chest pain.  While you were here we performed physical exam, did lab work as well as imaging.  There was no emergent cause of your symptoms, primarily no indication that this is cardiac in nature,   To-Do:  Please follow-up with your primary doctor within the next 2-3 days. It is important that you review any labs or imaging results (if any) that you had today with them. Your preliminary imaging results (if any) are attached. Please return to the Emergency Department or call 911 if you experience chest pain, shortness of breath, severe pain, severe fever, altered mental status, or have any reason to think that you need emergency medical care.  Thank you again.  Hope you feel better soon.  Department of Emergency Medicine

## 2024-06-03 NOTE — ED Provider Notes (Signed)
 73 year old female history of hypertension, status post mastectomy on the left, presents today with right sided pain.  Pain is worse with palpation and movement.  No associated dyspnea. EKG without acutely ischemic changes Troponin is negative.  Patient has had pain coming and going for a week.  Low index of suspicion for cardiac pain.  Initial troponin was obtained and is negative. Plan to treat for musculoskeletal pain. Will give strict return precautions and advised regarding need for follow-up. Patient appears stable for discharge   Levander Houston, MD 06/03/24 [phone]

## 2024-06-03 NOTE — ED Triage Notes (Signed)
 Pt. Stated, I started having some tingy feeling in my chest around 500 this morning along with some nausea, and shakes.

## 2024-06-07 ENCOUNTER — Ambulatory Visit (INDEPENDENT_AMBULATORY_CARE_PROVIDER_SITE_OTHER): Admitting: Family

## 2024-06-07 ENCOUNTER — Encounter: Payer: Self-pay | Admitting: Family

## 2024-06-07 VITALS — BP 142/68 | HR 72 | Temp 98.2°F | Ht 64.0 in | Wt 194.4 lb

## 2024-06-07 DIAGNOSIS — D72829 Elevated white blood cell count, unspecified: Secondary | ICD-10-CM

## 2024-06-07 DIAGNOSIS — I10 Essential (primary) hypertension: Secondary | ICD-10-CM

## 2024-06-07 DIAGNOSIS — R9431 Abnormal electrocardiogram [ECG] [EKG]: Secondary | ICD-10-CM

## 2024-06-07 MED ORDER — AMLODIPINE BESYLATE 5 MG PO TABS: 7.5000 mg | ORAL_TABLET | Freq: Every day | ORAL | 1 refills | Status: DC

## 2024-06-07 NOTE — Progress Notes (Signed)
 Provider: Ashle Stief FNP-C   Gil Greig BRAVO, NP  Patient Care Team: Gil Greig BRAVO, NP as PCP - General (Adult Health Nurse Practitioner) Tyree Nanetta SAILOR, RN as Oncology Nurse Navigator Lanny Callander, MD as Consulting Physician (Hematology) Ebbie Cough, MD as Consulting Physician (General Surgery) Shannon Agent, MD as Consulting Physician (Radiation Oncology) Burton, Lacie K, NP as Nurse Practitioner (Nurse Practitioner)  Extended Emergency Contact Information Primary Emergency Contact: Bullock,[name] Address: [address] [place], [place] [address] [place] Home Phone: [phone] Mobile Phone: [phone] Relation: Significant other Secondary Emergency Contact: Gil,Katie Address: [address]          [place], [place] Home Phone: [phone] Work Phone: [phone] Relation: Daughter  Code Status:  Full code  Goals of care: Advanced Directive information    06/03/2024    7:32 AM  Advanced Directives  Does Patient Have a Medical Advance Directive? No     Chief Complaint  Patient presents with   Transitions [facility] follow up.Concern about the EKG that was done at the ED. She states mychart states it was abnormal. No pain today just some anxiety   Anxiety     Discussed the use of AI scribe software for clinical note transcription with the patient, who gave verbal consent to proceed.  History of Present Illness   Terri Hawkins is a 73 year old female who presents with right-sided chest pain for follow-up after an emergency room visit on 06/03/2024.  On October 24th, she experienced right-sided chest pain accompanied by uncontrollable shaking and nausea. The pain was more frequent on the morning of the ER visit. Since the hospital visit, she has not experienced further episodes of shaking or pain. During the ER visit, she was told that the EKG was not normal and later saw in her MyChart that  it mentioned an old anteroseptal infarct, but she was not given further explanation at the time. Blood work included a troponin test, which was negative. A chest X-ray was performed during the ER visit showed no acute cardiopulmonary abnormality.  Her blood pressure medication, amlodipine , was increased from 5 mg to 7.5 mg on 05/26/2024 by Her PCP Amy Fargo,NP, which she feels makes her feel 'on edge' and anxious. She monitors her blood pressure at home, which has been mostly under 140, with a recent reading of 125. She does not have a history of anxiety and is not currently taking hydroxyzine , which was previously prescribed for itching.  Recent lab work showed a glucose level of 122, which she attributes to not fasting and having eaten a peanut butter sandwich. Her white blood cell count was slightly elevated at 11, but she denies any symptoms such as coughing or urinary issues. She has a history of elevated white blood cell counts, with previous readings of 9 and over 10 though none on the chart with over 10.  She is focused on losing weight and improving her diet, which includes cooking her own meals and reducing sweets and carbohydrates. She is not currently engaging in regular exercise but acknowledges the need to do so. She occasionally takes melatonin for sleep and Valtrex  as needed. She is not a smoker.   Past Medical History:  Diagnosis Date   Allergy 2016, I think   Yellow jacket sting   Breast cancer (HCC) 02/2021   Eczema    Family history of breast cancer  02/27/2021   Family history of glioblastoma 02/27/2021   Family history of melanoma 02/27/2021   Hyperlipidemia    Hypertension    Obesity    Pre-diabetes    Skin cancer Not sure of date   [facility] Dermatology   Vitamin D  deficiency    Past Surgical History:  Procedure Laterality Date   BREAST BIOPSY Left 02/2021   CESAREAN SECTION     x3   CHOLECYSTECTOMY     MASTECTOMY Left 03/27/2021   MASTECTOMY W/ SENTINEL NODE  BIOPSY Left 03/27/2021   Procedure: LEFT MASTECTOMY WITH LEFT AXILLARY SENTINEL LYMPH NODE BIOPSY;  Surgeon: Ebbie Cough, MD;  Location: [facility] OR;  Service: General;  Laterality: Left;   TONSILLECTOMY     TUBAL LIGATION     Not sure of date    Allergies  Allergen Reactions   Neomycin Rash   Zocor [Simvastatin]     Leg pain   Wasp Venom Protein Itching    Blood pressure goes up   Yellow Jacket Venom [Bee Venom] Itching    Blood pressure up    Allergies as of 06/07/2024       Reactions   Neomycin Rash   Zocor [simvastatin]    Leg pain   Wasp Venom Protein Itching   Blood pressure goes up   Yellow Jacket Venom [bee Venom] Itching   Blood pressure up        Medication List        Accurate as of June 07, 2024 10:33 AM. If you have any questions, ask your nurse or doctor.          STOP taking these medications    hydrOXYzine  25 MG capsule Commonly known as: VISTARIL  Stopped by: Ilyssa Grennan C Tacy Chavis       TAKE these medications    acetaminophen  500 MG tablet Commonly known as: TYLENOL  Take 500 mg by mouth every 6 (six) hours as needed for moderate pain or headache.   amLODipine  5 MG tablet Commonly known as: NORVASC  Take 1.5 tablets (7.5 mg total) by mouth daily.   Cholecalciferol 25 MCG (1000 UT) Chew Chew 1,000 Units by mouth daily.   melatonin 3 MG Tabs tablet Take 6 mg by mouth at bedtime as needed (sleep).   multivitamin capsule Take 1 capsule by mouth daily.   valACYclovir  500 MG tablet Commonly known as: VALTREX  Take 2 tablets (1,000 mg total) by mouth daily as needed.        Review of Systems  Constitutional:  Negative for appetite change, chills, fatigue, fever and unexpected weight change.  HENT:  Negative for congestion, dental problem, ear discharge, ear pain, hearing loss, nosebleeds, postnasal drip, rhinorrhea, sinus pressure, sinus pain, sneezing and sore throat.   Eyes:  Negative for pain, discharge, redness, itching and  visual disturbance.  Respiratory:  Negative for cough, chest tightness, shortness of breath and wheezing.   Cardiovascular:  Negative for chest pain, palpitations and leg swelling.  Gastrointestinal:  Negative for abdominal distention, abdominal pain, constipation, diarrhea, nausea and vomiting.  Endocrine: Negative for cold intolerance, heat intolerance, polydipsia, polyphagia and polyuria.  Genitourinary:  Negative for difficulty urinating, dysuria, flank pain, frequency and urgency.  Musculoskeletal:  Negative for arthralgias, back pain, gait problem, joint swelling, myalgias, neck pain and neck stiffness.  Skin:  Negative for color change, pallor, rash and wound.  Neurological:  Negative for dizziness, syncope, speech difficulty, weakness, light-headedness, numbness and headaches.  Hematological:  Does not bruise/bleed easily.  Psychiatric/Behavioral:  Negative for  agitation, behavioral problems, confusion, hallucinations and sleep disturbance. The patient is not nervous/anxious.     Immunization History  Administered Date(s) Administered   INFLUENZA, HIGH DOSE SEASONAL PF 07/07/2023, 05/26/2024   Influenza,inj,Quad PF,6+ Mos 05/02/2019   PFIZER(Purple Top)SARS-COV-2 Vaccination 10/04/2019, 10/25/2019, 05/23/2020, 04/21/2021   PNEUMOCOCCAL CONJUGATE-20 07/30/2023   Tdap 08/13/2007   Unspecified SARS-COV-2 Vaccination 07/29/2022   Zoster Recombinant(Shingrix) 04/23/2015   Pertinent  Health Maintenance Due  Topic Date Due   Colonoscopy  02/19/2023   Mammogram  01/25/2025   Influenza Vaccine  Completed   DEXA SCAN  Completed      05/09/2021    3:29 PM 07/02/2023   10:24 AM 07/30/2023    1:40 PM 08/20/2023    9:55 AM 05/26/2024   10:06 AM  Fall Risk  Falls in the past year?  0 0 0 0  Was there an injury with Fall?  0 0 0 0  Fall Risk Category Calculator  0 0 0 0  (RETIRED) Patient Fall Risk Level Low fall risk       Patient at Risk for Falls Due to  No Fall Risks No Fall Risks  No Fall Risks No Fall Risks  Fall risk Follow up  Falls evaluation completed;Education provided;Falls prevention discussed Falls evaluation completed;Education provided;Falls prevention discussed Falls evaluation completed;Education provided;Falls prevention discussed Falls evaluation completed     Data saved with a previous flowsheet row definition   Functional Status Survey:    Vitals:   06/07/24 0915 06/07/24 0916  BP:  (!) 142/68  Pulse:  72  Temp: 98.2 F (36.8 C) 98.2 F (36.8 C)  TempSrc: Temporal Temporal  SpO2:  98%  Weight:  194 lb 6.4 oz (88.2 kg)  Height:  5' 4 (1.626 m)   Body mass index is 33.37 kg/m. Physical Exam  VITALS: P- 103, BP- 142/ GENERAL: Alert, cooperative, well developed, no acute distress. HEENT: Normocephalic, normal oropharynx, moist mucous membranes. CHEST: Clear to auscultation bilaterally, no wheezes, rhonchi, or crackles, no chest wall tenderness. CARDIOVASCULAR: Normal heart rate and rhythm, S1 and S2 normal without murmurs. ABDOMEN: Soft, non-tender, non-distended, without organomegaly, normal bowel sounds. EXTREMITIES: No cyanosis or edema. NEUROLOGICAL: Cranial nerves grossly intact, moves all extremities without gross motor or sensory deficit.  SKIN: No rash,no lesion or erythema   PSYCHIATRY/BEHAVIORAL: Mood stable   Labs reviewed: Recent Labs    08/20/23 1013 11/03/23 0958 06/03/24 0734  NA 141 140 136  K 4.4 4.2 3.8  CL 105 106 101  CO2 26 27 22   GLUCOSE 109 101* 122*  BUN 17 14 16   CREATININE 0.80 0.82 0.88  CALCIUM 10.1 9.8 9.6   Recent Labs    07/28/23 0825 11/03/23 0958  AST 18 18  ALT 18 18  ALKPHOS  --  80  BILITOT 0.8 0.5  PROT 6.8 7.4  ALBUMIN  --  4.5   Recent Labs    07/28/23 0825 11/03/23 0958 06/03/24 0734  WBC 7.6 9.1 11.0*  NEUTROABS 3,549 4.5  --   HGB 14.5 13.5 14.4  HCT 43.9 41.4 43.7  MCV 90.7 89.6 89.4  PLT 296 293 319   Lab Results  Component Value Date   TSH 2.489 08/18/2013    Lab Results  Component Value Date   HGBA1C 5.9 (H) 05/02/2019   Lab Results  Component Value Date   CHOL 242 (H) 05/27/2021   HDL 67 05/27/2021   LDLCALC 156 (H) 05/27/2021   TRIG 109 05/27/2021   CHOLHDL 3.6  05/27/2021    Significant Diagnostic Results in last 30 days:  DG Chest 2 View Result Date: 06/03/2024 EXAM: 2 VIEW(S) XRAY OF THE CHEST 06/03/2024 07:53:00 AM COMPARISON: None available. CLINICAL HISTORY: Chest pain. Reason for exam: Chest pain. Per note: Pt. Stated, I started having some tingy feeling in my chest around 500 this morning along with some nausea, and shakes. FINDINGS: LUNGS AND PLEURA: No focal pulmonary opacity. No pulmonary edema. No pleural effusion. No pneumothorax. HEART AND MEDIASTINUM: No acute abnormality of the cardiac and mediastinal silhouettes. BONES AND SOFT TISSUES: Multilevel degenerative changes of thoracic spine. Surgical clips in right upper quadrant. IMPRESSION: 1. No acute cardiopulmonary process. 2. Postsurgical changes in the right upper quadrant. Electronically signed by: Helayne Hurst MD 06/03/2024 08:02 AM EDT RP Workstation: [id number]    Assessment/Plan  Essential hypertension Blood pressure is mostly controlled at home with readings in the 130s and a recent reading of 125/?. She reports anxiety since increasing amlodipine  to 7.5 mg. Office blood pressure was 142/? likely due to stress. - Continue amlodipine  7.5 mg daily - Encourage relaxation techniques such as deep breathing and listening to classical music - Advise on lifestyle modifications including healthy diet and regular exercise - Monitor blood pressure at home and report any significant changes  Antero-septal myocardial infarction EKG at the hospital showed an old antero-septal infarct, but today's EKG is normal, likely indicating the previous finding was due to anxiety. No acute ischemic changes were noted in the hospital EKG, and troponin levels were negative, ruling out a  recent myocardial infarction. - No immediate cardiology referral needed as today's EKG is normal - Focus on lifestyle modifications to support cardiovascular health. Advised to notify provider if symptoms recur   Elevated white blood cell count White blood cell count was slightly elevated at 11, with no symptoms of infection such as cough or urinary issues. Previous counts have been slightly elevated as well. No clear cause identified. - Recheck CBC on Thursday to monitor white blood cell count   Family/ staff Communication: Reviewed plan of care with patient verbalized understanding   Labs/tests ordered: Has lab orders in place  Next Appointment : Return if symptoms worsen or fail to improve.   Spent 31 minutes of Face to face and non-face to face with patient  >50% time spent counseling; reviewing medical record; tests; labs; documentation and developing future plan of care.   Roxan JAYSON Plough, NP

## 2024-06-09 ENCOUNTER — Other Ambulatory Visit

## 2024-06-09 ENCOUNTER — Other Ambulatory Visit: Payer: Self-pay

## 2024-06-09 DIAGNOSIS — E7800 Pure hypercholesterolemia, unspecified: Secondary | ICD-10-CM

## 2024-06-09 DIAGNOSIS — I10 Essential (primary) hypertension: Secondary | ICD-10-CM

## 2024-06-09 DIAGNOSIS — R739 Hyperglycemia, unspecified: Secondary | ICD-10-CM

## 2024-06-09 LAB — COMPREHENSIVE METABOLIC PANEL WITH GFR
AG Ratio: 1.9 (calc) (ref 1.0–2.5)
ALT: 20 U/L (ref 6–29)
AST: 19 U/L (ref 10–35)
Albumin: 4.5 g/dL (ref 3.6–5.1)
Alkaline phosphatase (APISO): 80 U/L (ref 37–153)
BUN: 16 mg/dL (ref 7–25)
CO2: 27 mmol/L (ref 20–32)
Calcium: 9.8 mg/dL (ref 8.6–10.4)
Chloride: 104 mmol/L (ref 98–110)
Creat: 0.73 mg/dL (ref 0.60–1.00)
Globulin: 2.4 g/dL (ref 1.9–3.7)
Glucose, Bld: 94 mg/dL (ref 65–99)
Potassium: 4.3 mmol/L (ref 3.5–5.3)
Sodium: 135 mmol/L (ref 135–146)
Total Bilirubin: 0.7 mg/dL (ref 0.2–1.2)
Total Protein: 6.9 g/dL (ref 6.1–8.1)
eGFR: 87 mL/min/1.73m2 (ref >=60)

## 2024-06-09 LAB — CBC WITH DIFFERENTIAL/PLATELET
Absolute Lymphocytes: 3341 {cells}/uL (ref 850–3900)
Absolute Monocytes: 723 {cells}/uL (ref 200–950)
Basophils Absolute: 43 {cells}/uL (ref 0–200)
Basophils Relative: 0.5 %
Eosinophils Absolute: 153 {cells}/uL (ref 15–500)
Eosinophils Relative: 1.8 %
HCT: 43.5 % (ref 35.0–45.0)
Hemoglobin: 14 g/dL (ref 11.7–15.5)
MCH: 29.4 pg (ref 27.0–33.0)
MCHC: 32.2 g/dL (ref 32.0–36.0)
MCV: 91.2 fL (ref 80.0–100.0)
MPV: 10.4 fL (ref 7.5–12.5)
Monocytes Relative: 8.5 %
Neutro Abs: 4242 {cells}/uL (ref 1500–7800)
Neutrophils Relative %: 49.9 %
Platelets: 304 Thousand/uL (ref 140–400)
RBC: 4.77 Million/uL (ref 3.80–5.10)
RDW: 12.7 % (ref 11.0–15.0)
Total Lymphocyte: 39.3 %
WBC: 8.5 Thousand/uL (ref 3.8–10.8)

## 2024-06-09 LAB — LIPID PANEL
Cholesterol: 229 mg/dL — ABNORMAL HIGH (ref <200)
HDL: 70 mg/dL (ref >=50)
LDL Cholesterol (Calc): 137 mg/dL — ABNORMAL HIGH
Non-HDL Cholesterol (Calc): 159 mg/dL — ABNORMAL HIGH (ref <130)
Total CHOL/HDL Ratio: 3.3 (calc) (ref <5.0)
Triglycerides: 117 mg/dL (ref <150)

## 2024-06-09 LAB — HEMOGLOBIN A1C
Hgb A1c MFr Bld: 5.8 % — ABNORMAL HIGH (ref <5.7)
Mean Plasma Glucose: 120 mg/dL
eAG (mmol/L): 6.6 mmol/L

## 2024-06-10 ENCOUNTER — Ambulatory Visit: Payer: Self-pay | Admitting: Orthopedic Surgery

## 2024-06-12 LAB — COLOGUARD: COLOGUARD: NEGATIVE

## 2024-06-13 ENCOUNTER — Encounter: Payer: Self-pay | Admitting: Radiology

## 2024-06-13 ENCOUNTER — Telehealth: Payer: Self-pay

## 2024-06-13 MED ORDER — EZETIMIBE 10 MG PO TABS: 10.0000 mg | ORAL_TABLET | Freq: Every day | ORAL | 11 refills | Status: AC

## 2024-06-13 NOTE — Telephone Encounter (Signed)
 Copied from CRM #[id number]. Topic: Clinical - Prescription Issue >> Jun 13, 2024  4:42 PM Chiquita SQUIBB wrote: Reason for CRM: Patient is calling in stating the doctor told her she would be sending a prescription for Zetia, but the pharmacy has not received it. Please advise the patient.

## 2024-06-13 NOTE — Telephone Encounter (Signed)
 It appears rx was not sent, I have now submitted.

## 2024-07-27 ENCOUNTER — Other Ambulatory Visit: Payer: Self-pay | Admitting: Orthopedic Surgery

## 2024-07-27 DIAGNOSIS — I10 Essential (primary) hypertension: Secondary | ICD-10-CM

## 2024-08-24 ENCOUNTER — Encounter: Payer: Self-pay | Admitting: Orthopedic Surgery

## 2024-08-25 ENCOUNTER — Ambulatory Visit: Payer: Self-pay | Admitting: Orthopedic Surgery

## 2024-08-25 ENCOUNTER — Encounter: Payer: Self-pay | Admitting: Orthopedic Surgery

## 2024-08-25 VITALS — BP 128/80 | HR 90 | Temp 98.1°F | Ht 64.0 in | Wt 206.6 lb

## 2024-08-25 DIAGNOSIS — I10 Essential (primary) hypertension: Secondary | ICD-10-CM

## 2024-08-25 DIAGNOSIS — M8588 Other specified disorders of bone density and structure, other site: Secondary | ICD-10-CM | POA: Diagnosis not present

## 2024-08-25 DIAGNOSIS — E6609 Other obesity due to excess calories: Secondary | ICD-10-CM

## 2024-08-25 DIAGNOSIS — M545 Low back pain, unspecified: Secondary | ICD-10-CM

## 2024-08-25 DIAGNOSIS — Z6833 Body mass index (BMI) 33.0-33.9, adult: Secondary | ICD-10-CM | POA: Diagnosis not present

## 2024-08-25 DIAGNOSIS — E66811 Obesity, class 1: Secondary | ICD-10-CM

## 2024-08-25 DIAGNOSIS — M79605 Pain in left leg: Secondary | ICD-10-CM

## 2024-08-25 DIAGNOSIS — R7303 Prediabetes: Secondary | ICD-10-CM

## 2024-08-25 DIAGNOSIS — E7800 Pure hypercholesterolemia, unspecified: Secondary | ICD-10-CM | POA: Diagnosis not present

## 2024-08-25 DIAGNOSIS — Z853 Personal history of malignant neoplasm of breast: Secondary | ICD-10-CM | POA: Diagnosis not present

## 2024-08-25 NOTE — Progress Notes (Signed)
 "   Careteam: Patient Care Team: Gil Greig BRAVO, NP as PCP - General (Adult Health Nurse Practitioner) Tyree Nanetta SAILOR, RN as Oncology Nurse Navigator Lanny Callander, MD as Consulting Physician (Hematology) Ebbie Cough, MD as Consulting Physician (General Surgery) Shannon Agent, MD as Consulting Physician (Radiation Oncology) Burton, Lacie K, NP as Nurse Practitioner (Nurse Practitioner)  Seen by: [name], [name]  PLACE OF SERVICE:  [facility]  Advanced Directive information    Allergies[1]  Chief Complaint  Patient presents with   Medication management of chronic issues    3 month follow up. Discuss the need for annual wellness and colonoscopy.      HPI: Patient is a 74 y.o. female seen today for medical management of chronic conditions.   Discussed the use of AI scribe software for clinical note transcription with the patient, who gave verbal consent to proceed.  History of Present Illness   Terri Hawkins is a 74 year old female who presents for follow-up of blood pressure and cholesterol management.  10/24 she recently experienced an episode of chest wall pain that led her to the emergency room, where it was initially suspected to be a heart attack. An EKG performed at the time was initially reported as abnormal but was later found to be normal upon repeat testing. She was not referred to cardiology. No recent episodes of chest discomfort or pain.   She is currently taking amlodipine  for hypertension and reports doing well with it. She has also started taking Zetia  for cholesterol management, although she has been inconsistent with its use.   She experiences chronic sciatica pain, particularly when standing or walking, which radiates from her waist to her knees. The pain intensifies with prolonged standing or walking, and she sometimes needs to sit down when it becomes severe. She manages the pain with Tylenol  and occasionally ibuprofen. She recalls a past episode  where her back 'went out,' leading to a visit with Dr. Harden, who prescribed prednisone , which she found very effective. However, she was on it for five months, which she acknowledges was too long.  Her family history includes grandparents with high blood pressure and a mother who was diagnosed with breast cancer at age 47. She herself was diagnosed with breast cancer at age 45. Anastrozole  discontinued 07/2021 due to due to worsening eczema. Yearly f/u with oncology scheduled.    Review of Systems:  Review of Systems  Constitutional: Negative.   HENT: Negative.    Respiratory: Negative.    Cardiovascular: Negative.   Gastrointestinal: Negative.   Genitourinary: Negative.   Musculoskeletal:  Positive for back pain. Negative for falls.  Skin: Negative.   Neurological: Negative.   Psychiatric/Behavioral: Negative.      Past Medical History:  Diagnosis Date   Allergy 2016, I think   Yellow jacket sting   Breast cancer (HCC) 02/2021   Eczema    Family history of breast cancer 02/27/2021   Family history of glioblastoma 02/27/2021   Family history of melanoma 02/27/2021   Hyperlipidemia    Hypertension    Started medication   Obesity    Pre-diabetes    Skin cancer Not sure of date   [facility]   Vitamin D  deficiency    Past Surgical History:  Procedure Laterality Date   BREAST BIOPSY Left 02/2021   CESAREAN SECTION     x3   CHOLECYSTECTOMY     When I was 74 yo   MASTECTOMY Left 03/27/2021   MASTECTOMY W/  SENTINEL NODE BIOPSY Left 03/27/2021   Procedure: LEFT MASTECTOMY WITH LEFT AXILLARY SENTINEL LYMPH NODE BIOPSY;  Surgeon: Ebbie Cough, MD;  Location: [facility] OR;  Service: General;  Laterality: Left;   TONSILLECTOMY     TUBAL LIGATION     Not sure of date   Social History:   reports that she has never smoked. She has never used smokeless tobacco. She reports current alcohol use of about 1.0 standard drink of alcohol per week. She reports that she does not  use drugs.  Family History  Problem Relation Age of Onset   Breast cancer Mother 73   Diabetes Father    Brain cancer Father    Cancer Father 20       glioblastoma   Breast cancer Maternal Aunt        dx after 50   Melanoma Maternal Uncle        dx after 50, arm    Medications: Patient's Medications  New Prescriptions   No medications on file  Previous Medications   ACETAMINOPHEN  (TYLENOL ) 500 MG TABLET    Take 500 mg by mouth every 6 (six) hours as needed for moderate pain or headache.   AMLODIPINE  (NORVASC ) 5 MG TABLET    TAKE 1 TABLET (5 MG TOTAL) BY MOUTH DAILY.   CHOLECALCIFEROL 25 MCG (1000 UT) CHEW    Chew 1,000 Units by mouth daily.   EZETIMIBE  (ZETIA ) 10 MG TABLET    Take 1 tablet (10 mg total) by mouth daily.   MELATONIN 3 MG TABS TABLET    Take 6 mg by mouth at bedtime as needed (sleep).   MULTIPLE VITAMIN (MULTIVITAMIN) CAPSULE    Take 1 capsule by mouth daily.   VALACYCLOVIR  (VALTREX ) 500 MG TABLET    Take 2 tablets (1,000 mg total) by mouth daily as needed.  Modified Medications   No medications on file  Discontinued Medications   No medications on file    Physical Exam:  Vitals:   08/24/24 1614  BP: 128/80  Pulse: 90  Temp: 98.1 F (36.7 C)  SpO2: 98%  Weight: 206 lb 9.6 oz (93.7 kg)  Height: 5' 4 (1.626 m)   Body mass index is 35.46 kg/m. Wt Readings from Last 3 Encounters:  08/24/24 206 lb 9.6 oz (93.7 kg)  06/07/24 194 lb 6.4 oz (88.2 kg)  05/26/24 200 lb (90.7 kg)    Physical Exam Vitals reviewed.  Constitutional:      Appearance: She is obese.  HENT:     Head: Normocephalic.  Eyes:     General:        Right eye: No discharge.        Left eye: No discharge.  Cardiovascular:     Rate and Rhythm: Normal rate and regular rhythm.     Pulses: Normal pulses.     Heart sounds: Normal heart sounds.  Pulmonary:     Effort: Pulmonary effort is normal.     Breath sounds: Normal breath sounds.  Abdominal:     General: Bowel sounds are  normal.     Palpations: Abdomen is soft.  Musculoskeletal:     Cervical back: Neck supple.     Right lower leg: No edema.     Left lower leg: No edema.  Skin:    General: Skin is warm.     Capillary Refill: Capillary refill takes less than 2 seconds.  Neurological:     General: No focal deficit present.     Mental  Status: She is alert and oriented to person, place, and time.  Psychiatric:        Mood and Affect: Mood normal.     Labs reviewed: Basic Metabolic Panel: Recent Labs    11/03/23 0958 06/03/24 0734 06/09/24 0813  NA 140 136 135  K 4.2 3.8 4.3  CL 106 101 104  CO2 27 22 27   GLUCOSE 101* 122* 94  BUN 14 16 16   CREATININE 0.82 0.88 0.73  CALCIUM 9.8 9.6 9.8   Liver Function Tests: Recent Labs    11/03/23 0958 06/09/24 0813  AST 18 19  ALT 18 20  ALKPHOS 80  --   BILITOT 0.5 0.7  PROT 7.4 6.9  ALBUMIN 4.5  --    No results for input(s): LIPASE, AMYLASE in the last 8760 hours. No results for input(s): AMMONIA in the last 8760 hours. CBC: Recent Labs    11/03/23 0958 06/03/24 0734 06/09/24 0813  WBC 9.1 11.0* 8.5  NEUTROABS 4.5  --  4,242  HGB 13.5 14.4 14.0  HCT 41.4 43.7 43.5  MCV 89.6 89.4 91.2  PLT 293 319 304   Lipid Panel: Recent Labs    06/09/24 0813  CHOL 229*  HDL 70  LDLCALC 137*  TRIG 117  CHOLHDL 3.3   TSH: No results for input(s): TSH in the last 8760 hours. A1C: Lab Results  Component Value Date   HGBA1C 5.8 (H) 06/09/2024     Assessment/Plan 1. Essential hypertension, benign (Primary) - controlled with amlodipine  - BUN/creat 16/0.73 06/09/2024  2. Pure hypercholesterolemia - allergy to statins - total 229, LDL 137 06/09/2024 - not consistently taking Zetia  - advised to take x 3 months and schedule lab visit - cont diet low in fat, processed and fried foods - consider Repatha injections if no improvement  - Lipid Panel; Future  3. History of breast cancer - diagnosed 2022 - 07/2021 anastrozole   discontinued due to eczema - denies changes in breasts - yearly f/u scheduled   4. Low back pain radiating to left lower extremity - ongoing  - 11/2022 noted DDD - recommend weight loss to reduce back pain  - cont tylenol   - advised to schedule with Dr. Harden if pain worsens   5. Class 1 obesity due to excess calories without serious comorbidity with body mass index (BMI) of 33.0 to 33.9 in adult - BMI 35.46 - admits to eating sweets  - recommend < 1500 calories per day> advised to track calories x 1 week - recommend light exercise 150 minutes/week - recommend diet with lean protein and vegetables  6. Prediabetes - A1c 5.8 06/09/2024 - diet controlled  - recommend weight loss to prevent progression  - Hemoglobin A1c; Future  7. Osteopenia of spine - DEXA 2023 - cont vitamin D  - recommend adding calcium> Caltrate  - repeat DEXA 2026 or 2027  Total time: 33 minutes. Greater than 50% of total time spent doing patient education regarding health maintenance, HTN, HLD, prediabetes, breast cancer and osteopenia including symptom/medication     Next appt: Visit date not found  Bridey Brookover Gil BODILY  [facility] [phone]     [1]  Allergies Allergen Reactions   Neomycin Rash   Zocor [Simvastatin]     Leg pain   Wasp Venom Protein Itching    Blood pressure goes up   Yellow Jacket Venom [Bee Venom] Itching    Blood pressure up   "

## 2024-08-25 NOTE — Patient Instructions (Addendum)
 Please schedule Medicare Annual Wellness sometime this year   Recommend exercise 150 minutes per week> light walking   Limit fats, dairy, processed and fried foods> look at [employer]   Consider seeing Dr. Harden for back pain > [phone]  Take Zetia  3 months and schedule fasting lab visit   Recommend tylenol  1000 mg as needed 1-3 times daily   Recommend counting calories x 1 week, need < 1500/ calories per day

## 2024-10-03 ENCOUNTER — Ambulatory Visit

## 2024-11-01 ENCOUNTER — Other Ambulatory Visit: Payer: PRIVATE HEALTH INSURANCE

## 2024-11-01 ENCOUNTER — Ambulatory Visit: Payer: PRIVATE HEALTH INSURANCE | Admitting: Nurse Practitioner

## 2024-11-08 ENCOUNTER — Other Ambulatory Visit: Payer: PRIVATE HEALTH INSURANCE

## 2025-03-02 ENCOUNTER — Ambulatory Visit: Payer: PRIVATE HEALTH INSURANCE | Admitting: Orthopedic Surgery
